# Patient Record
Sex: Male | Born: 1970 | Race: Black or African American | Hispanic: No | Marital: Married | Smoking: Light tobacco smoker
Health system: Southern US, Community
[De-identification: ages and names within clinical notes are randomized; demographics above are authoritative.]

## PROBLEM LIST (undated history)

## (undated) DIAGNOSIS — I10 Essential (primary) hypertension: Secondary | ICD-10-CM

## (undated) DIAGNOSIS — F1090 Alcohol use, unspecified, uncomplicated: Secondary | ICD-10-CM

## (undated) DIAGNOSIS — I119 Hypertensive heart disease without heart failure: Secondary | ICD-10-CM

## (undated) DIAGNOSIS — Z9189 Other specified personal risk factors, not elsewhere classified: Secondary | ICD-10-CM

## (undated) DIAGNOSIS — I341 Nonrheumatic mitral (valve) prolapse: Secondary | ICD-10-CM

## (undated) DIAGNOSIS — E785 Hyperlipidemia, unspecified: Secondary | ICD-10-CM

## (undated) DIAGNOSIS — I2510 Atherosclerotic heart disease of native coronary artery without angina pectoris: Secondary | ICD-10-CM

## (undated) DIAGNOSIS — R7989 Other specified abnormal findings of blood chemistry: Secondary | ICD-10-CM

## (undated) DIAGNOSIS — Z7289 Other problems related to lifestyle: Secondary | ICD-10-CM

## (undated) DIAGNOSIS — Z9289 Personal history of other medical treatment: Secondary | ICD-10-CM

## (undated) DIAGNOSIS — Z720 Tobacco use: Secondary | ICD-10-CM

## (undated) HISTORY — DX: Other specified abnormal findings of blood chemistry: R79.89

## (undated) HISTORY — DX: Alcohol use, unspecified, uncomplicated: F10.90

## (undated) HISTORY — PX: CORONARY ANGIOPLASTY: SHX604

## (undated) HISTORY — DX: Other problems related to lifestyle: Z72.89

## (undated) HISTORY — DX: Other specified personal risk factors, not elsewhere classified: Z91.89

## (undated) HISTORY — DX: Personal history of other medical treatment: Z92.89

## (undated) HISTORY — DX: Atherosclerotic heart disease of native coronary artery without angina pectoris: I25.10

## (undated) HISTORY — DX: Hypertensive heart disease without heart failure: I11.9

## (undated) HISTORY — DX: Tobacco use: Z72.0

## (undated) HISTORY — DX: Nonrheumatic mitral (valve) prolapse: I34.1

---

## 1997-12-07 ENCOUNTER — Emergency Department (HOSPITAL_COMMUNITY): Admission: EM | Admit: 1997-12-07 | Discharge: 1997-12-07 | Payer: Self-pay | Admitting: Emergency Medicine

## 2001-08-02 ENCOUNTER — Emergency Department (HOSPITAL_COMMUNITY): Admission: EM | Admit: 2001-08-02 | Discharge: 2001-08-02 | Payer: Self-pay | Admitting: Emergency Medicine

## 2001-08-02 ENCOUNTER — Encounter: Payer: Self-pay | Admitting: Emergency Medicine

## 2003-03-10 ENCOUNTER — Emergency Department (HOSPITAL_COMMUNITY): Admission: EM | Admit: 2003-03-10 | Discharge: 2003-03-10 | Payer: Self-pay | Admitting: Emergency Medicine

## 2004-03-03 ENCOUNTER — Emergency Department (HOSPITAL_COMMUNITY): Admission: EM | Admit: 2004-03-03 | Discharge: 2004-03-03 | Payer: Self-pay | Admitting: Emergency Medicine

## 2010-02-14 ENCOUNTER — Emergency Department (HOSPITAL_COMMUNITY)
Admission: EM | Admit: 2010-02-14 | Discharge: 2010-02-14 | Payer: Self-pay | Source: Home / Self Care | Admitting: Emergency Medicine

## 2010-02-14 IMAGING — CR DG CHEST 2V
2 series · 2 of 2 positions shown · non-contrast
Comparison: None.

CLINICAL DATA: Short of breath.  Cold symptoms.

CHEST - 2 VIEW

[w chest pa]
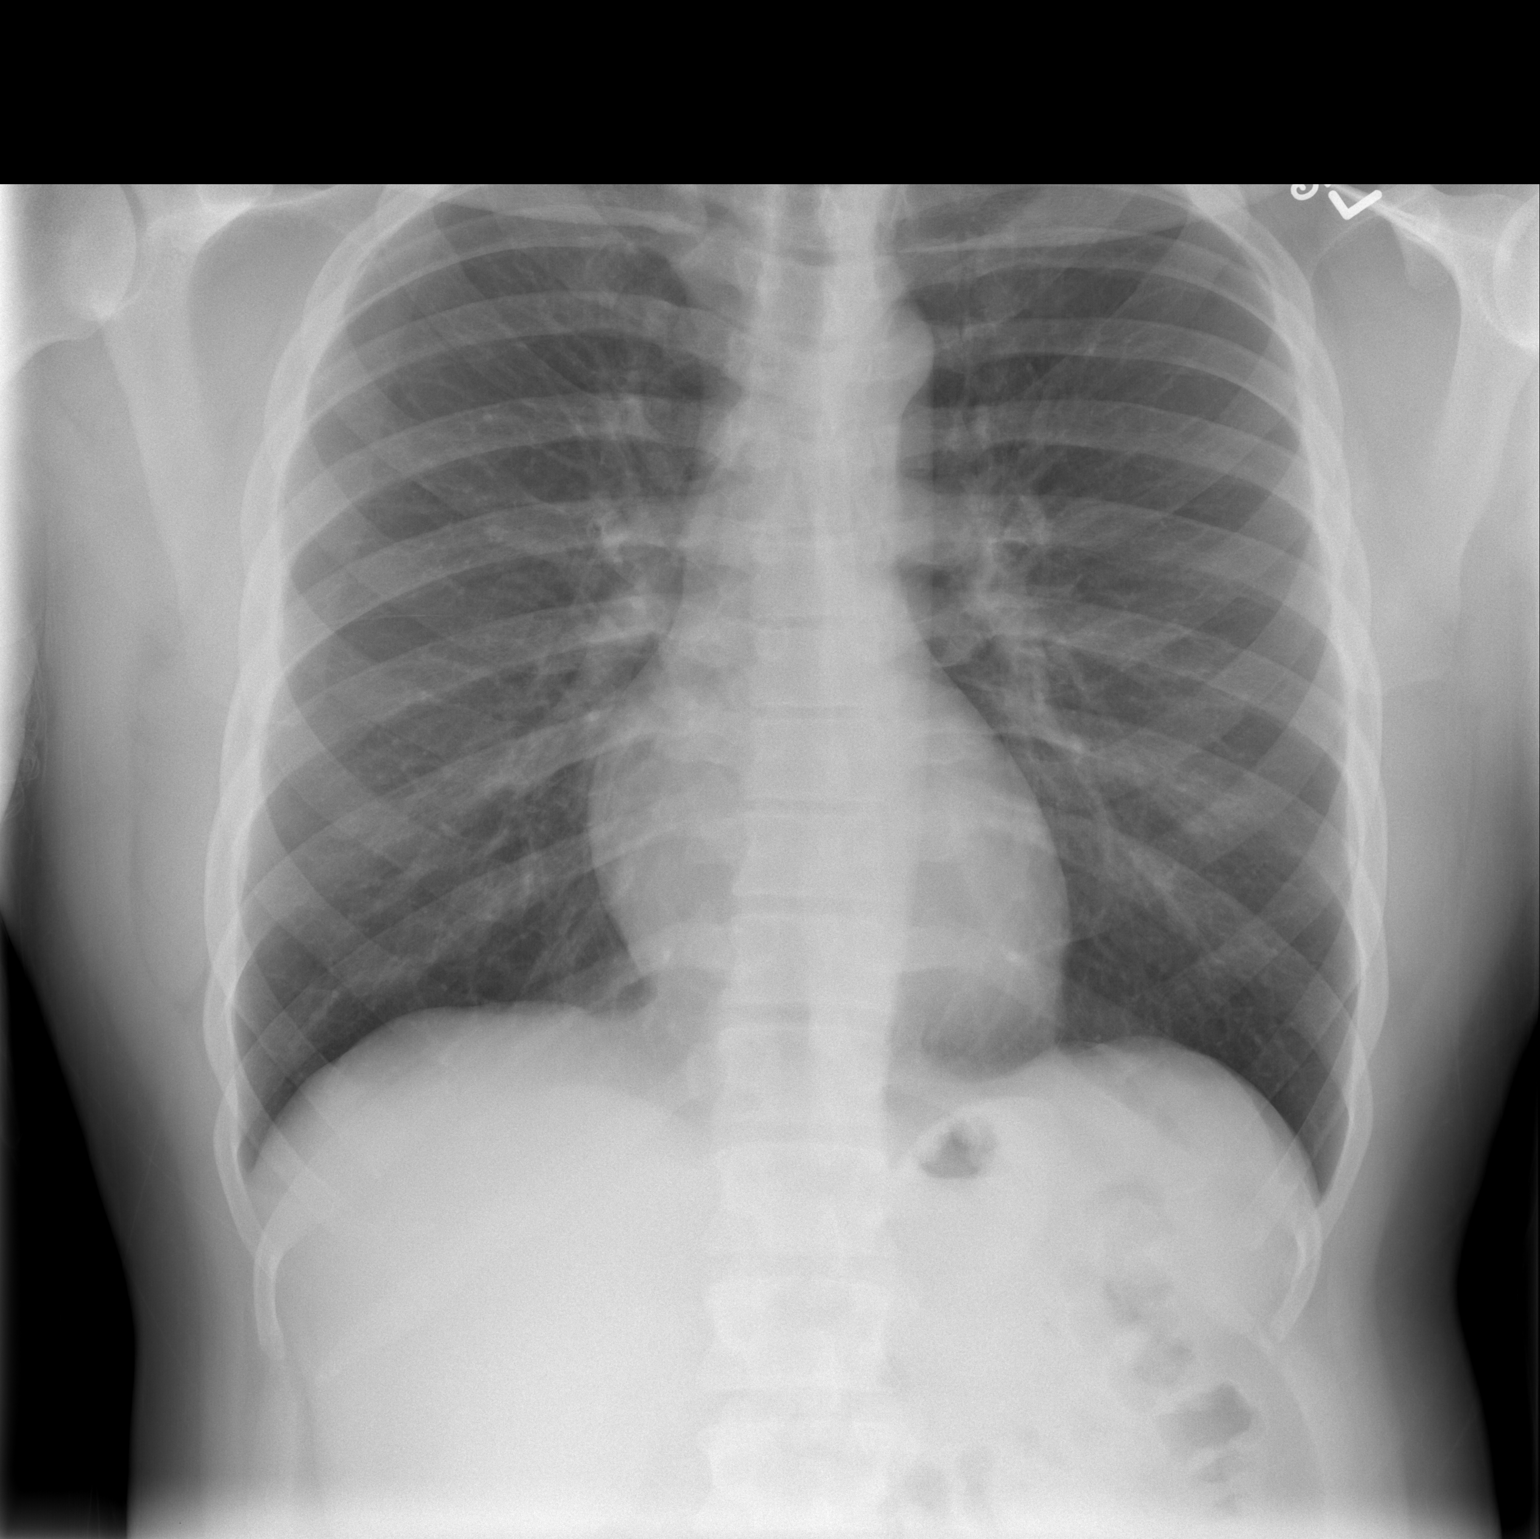

[w chest lat]
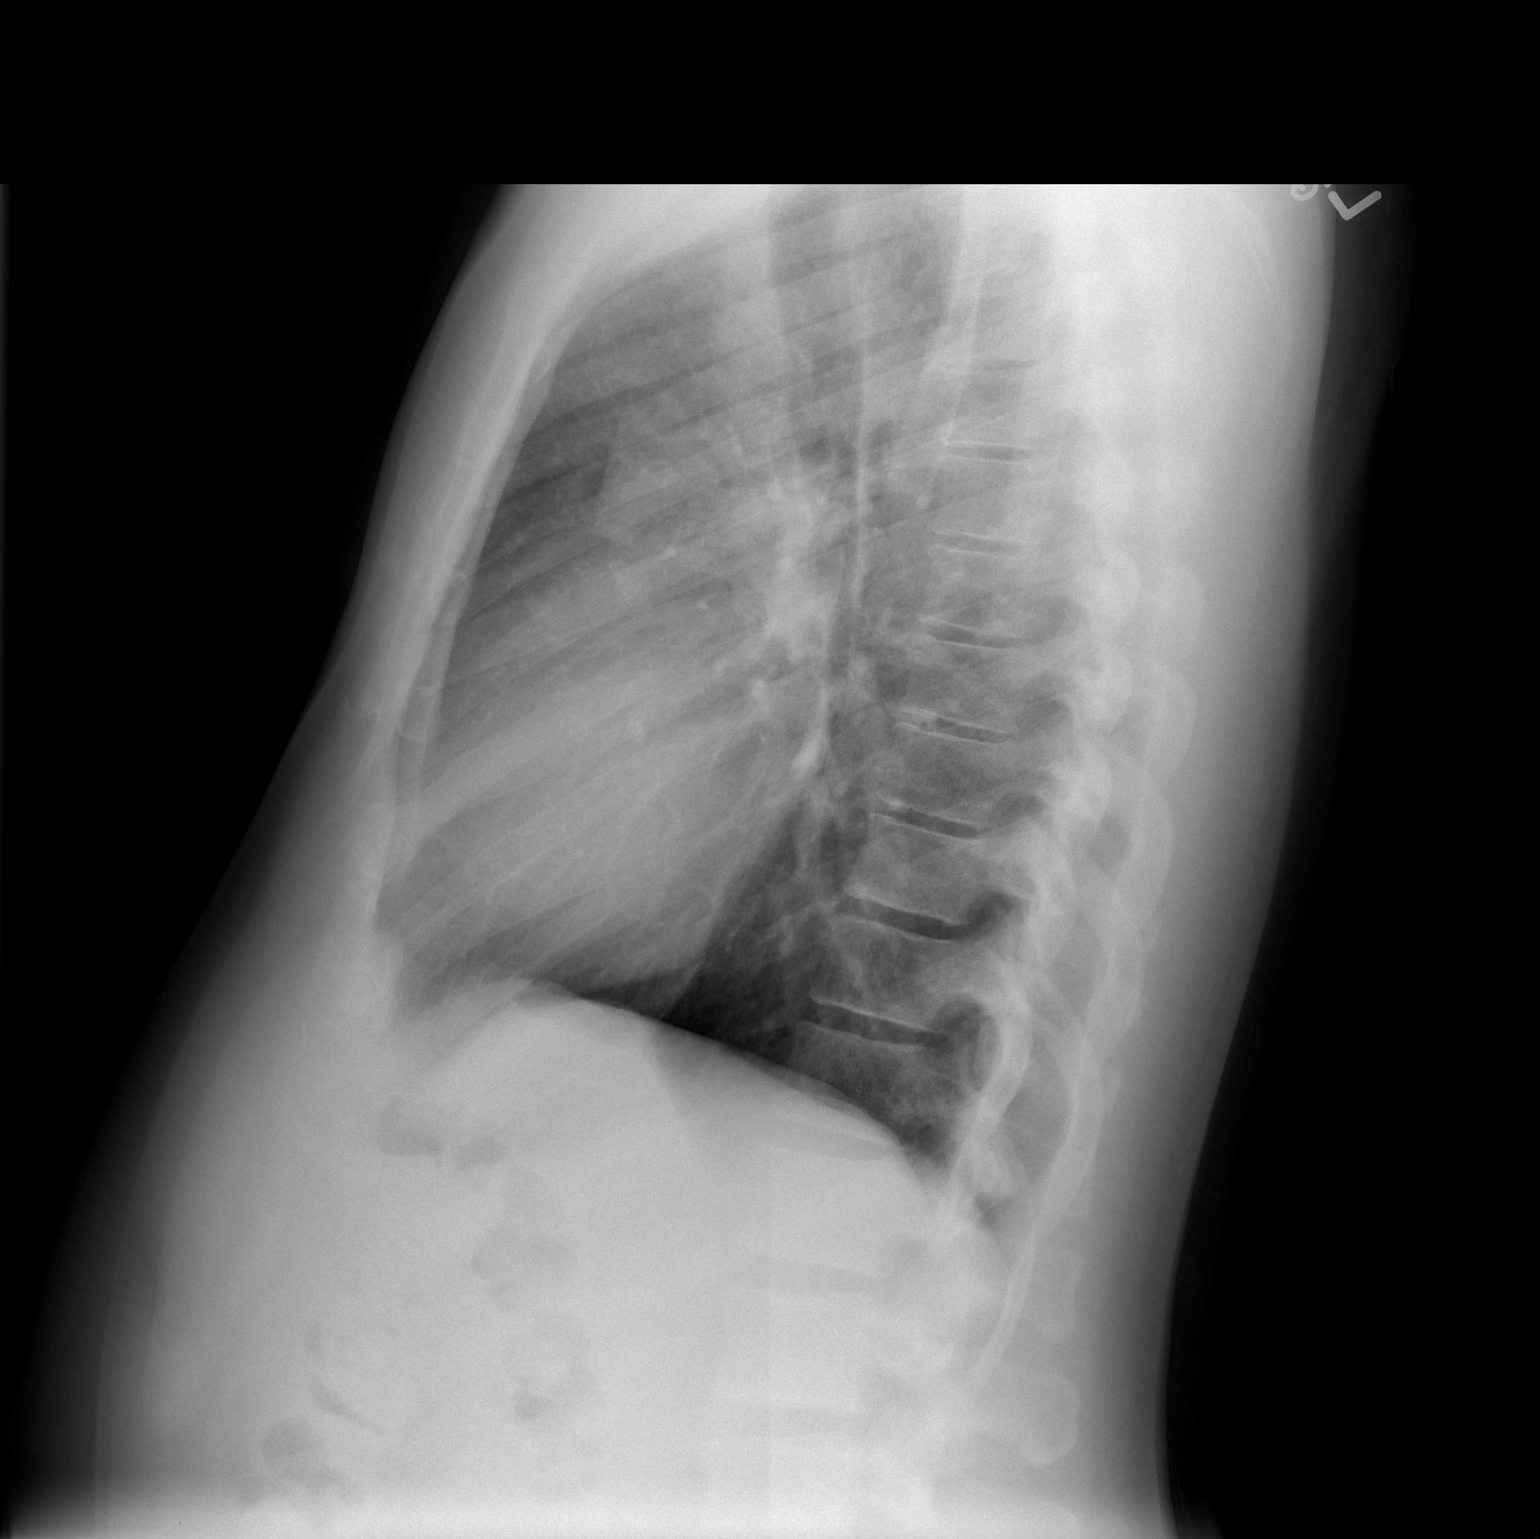

[2 of 2 positions shown; findings below may reference images not displayed]

FINDINGS: Midline trachea.   Normal heart size and mediastinal
contours. No pleural effusion or pneumothorax.  Clear lungs.
IMPRESSION: No acute cardiopulmonary disease.

## 2011-02-11 ENCOUNTER — Encounter (HOSPITAL_COMMUNITY): Payer: Self-pay | Admitting: *Deleted

## 2011-02-11 ENCOUNTER — Emergency Department (HOSPITAL_COMMUNITY)
Admission: EM | Admit: 2011-02-11 | Discharge: 2011-02-11 | Disposition: A | Discharge status: Home / Self Care | Payer: BC Managed Care – HMO | Attending: Emergency Medicine | Admitting: Emergency Medicine

## 2011-02-11 ENCOUNTER — Emergency Department (HOSPITAL_COMMUNITY): Payer: BC Managed Care – HMO

## 2011-02-11 ENCOUNTER — Other Ambulatory Visit: Payer: Self-pay

## 2011-02-11 DIAGNOSIS — I10 Essential (primary) hypertension: Secondary | ICD-10-CM | POA: Insufficient documentation

## 2011-02-11 DIAGNOSIS — R0789 Other chest pain: Secondary | ICD-10-CM | POA: Insufficient documentation

## 2011-02-11 HISTORY — DX: Essential (primary) hypertension: I10

## 2011-02-11 LAB — POCT I-STAT, CHEM 8
Calcium, Ion: 1.13 mmol/L (ref 1.12–1.32)
Chloride: 108 mEq/L (ref 96–112)
HCT: 46 % (ref 39.0–52.0)
Hemoglobin: 15.6 g/dL (ref 13.0–17.0)
TCO2: 23 mmol/L (ref 0–100)

## 2011-02-11 LAB — CBC
MCV: 80.8 fL (ref 78.0–100.0)
Platelets: 244 10*3/uL (ref 150–400)
RDW: 14.3 % (ref 11.5–15.5)
WBC: 5.5 10*3/uL (ref 4.0–10.5)

## 2011-02-11 LAB — DIFFERENTIAL
Basophils Absolute: 0 10*3/uL (ref 0.0–0.1)
Basophils Relative: 0 % (ref 0–1)
Eosinophils Relative: 7 % — ABNORMAL HIGH (ref 0–5)
Lymphocytes Relative: 37 % (ref 12–46)

## 2011-02-11 LAB — D-DIMER, QUANTITATIVE: D-Dimer, Quant: <0.22 ug/mL-FEU (ref 0.00–0.48)

## 2011-02-11 LAB — POCT I-STAT TROPONIN I: Troponin i, poc: 0.01 ng/mL (ref 0.00–0.08)

## 2011-02-11 IMAGING — CR DG CHEST 2V
2 series · 2 of 2 positions shown · non-contrast
Comparison: [DATE].

CLINICAL DATA: Left chest pain.  Smoker.  History of hypertension.

CHEST - 2 VIEW

[w chest pa]
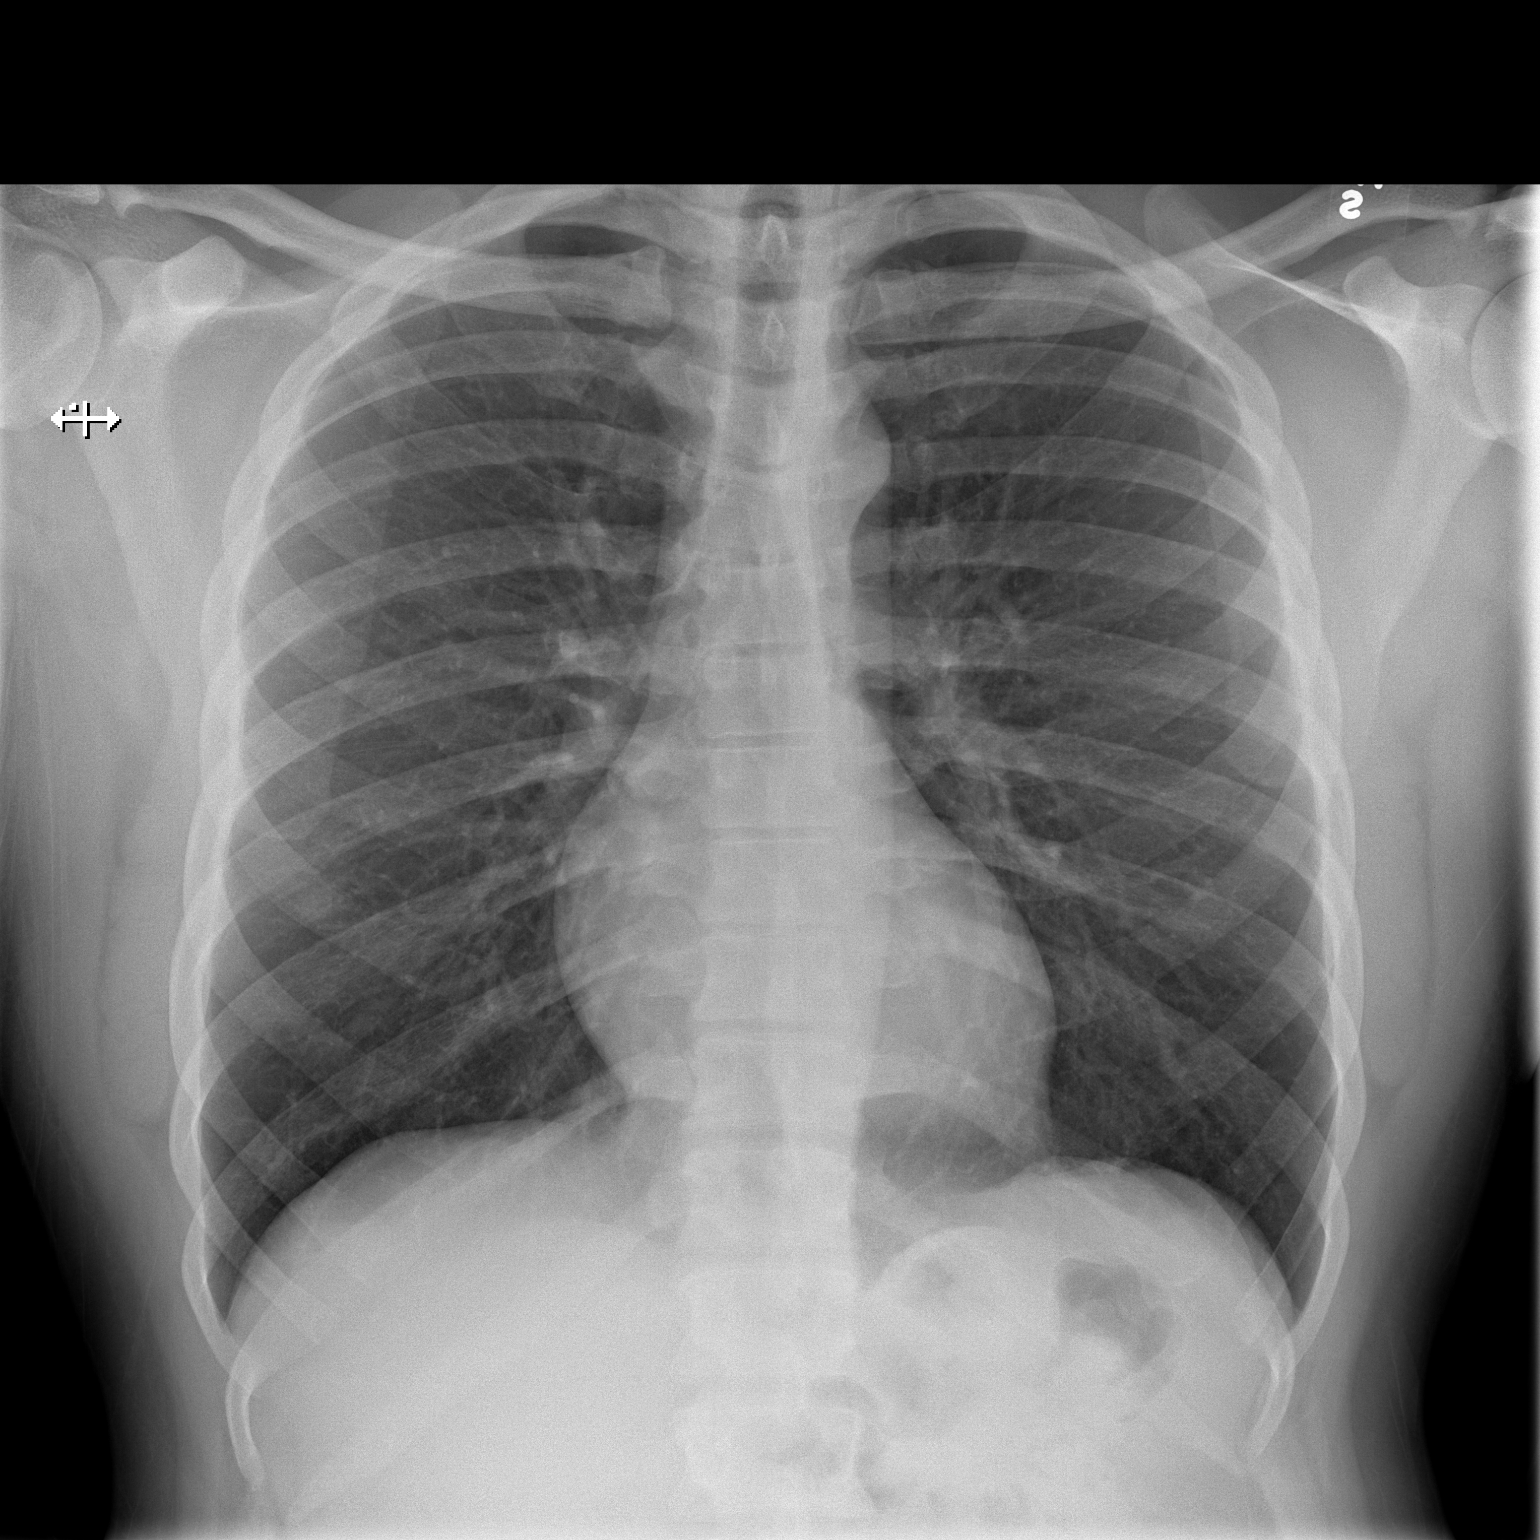

[w chest lat]
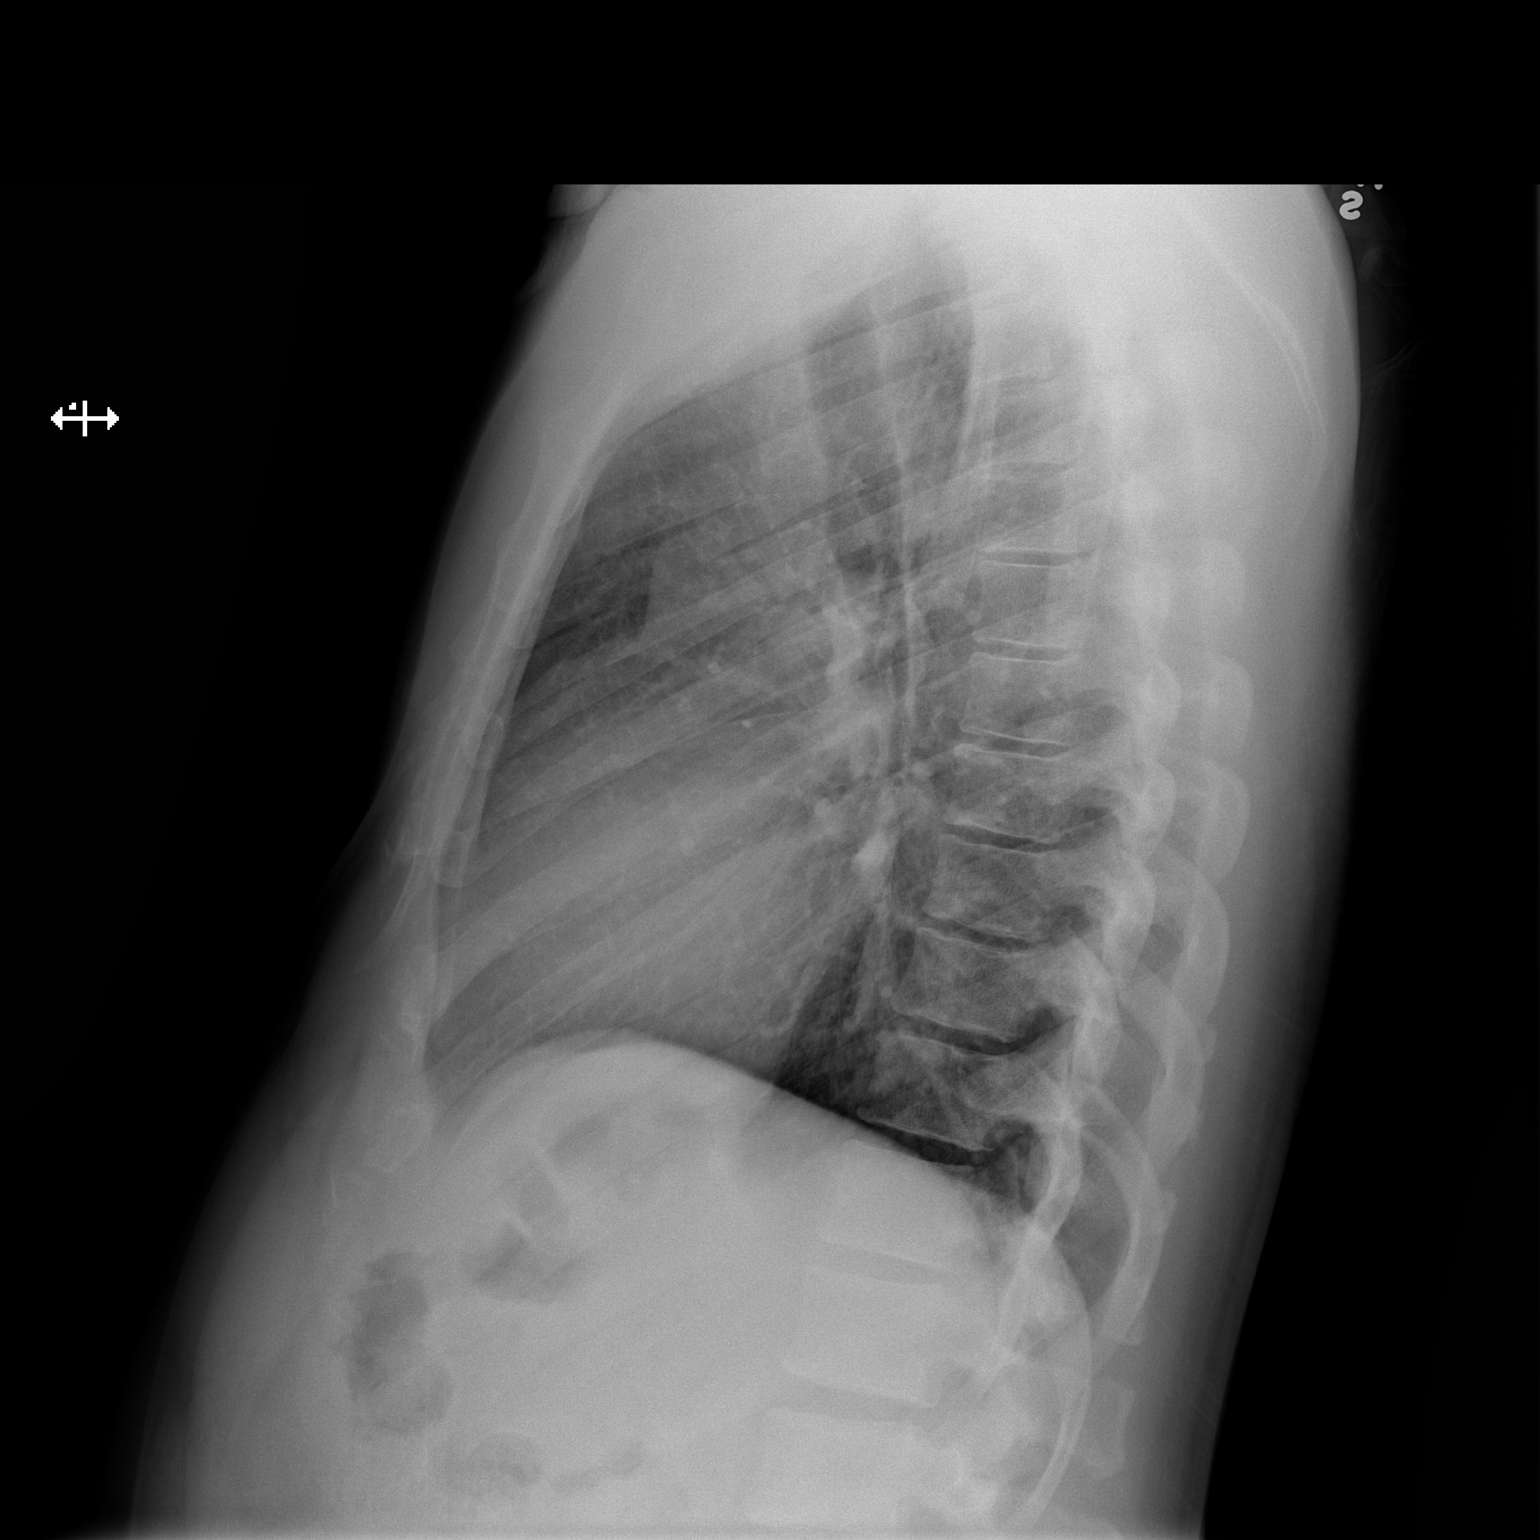

[2 of 2 positions shown; findings below may reference images not displayed]

FINDINGS: Stable normal sized heart and clear lungs.  Stable mild
diffuse peribronchial thickening and minimal scoliosis.
IMPRESSION: Stable mild chronic bronchitic changes.  No acute abnormality.

## 2011-02-11 MED ORDER — LISINOPRIL 20 MG PO TABS: 20.0000 mg | ORAL_TABLET | Freq: Every day | ORAL | Status: DC

## 2011-02-11 NOTE — ED Provider Notes (Signed)
History     CSN: [id number]  Arrival date & time 02/11/11  1146   First MD Initiated Contact with Patient 02/11/11 1249      Chief Complaint  Patient presents with  . Chest Pain    (Consider location/radiation/quality/duration/timing/severity/associated sxs/prior treatment) HPI This 41 year old male has history of hypertension in the past but does not have a doctor and does not recall the pressure medication he is to be on. He is here because for the last several days 24 hours a day he has a left-sided localized anterolateral chest discomfort when he takes a deep breath or twists his torso in a certain way. He has slight if any cough and no shortness of breath, no exertional pain, no lightheadedness no abdominal pain no back pain no flank pain no nausea or vomiting no diaphoresis no weakness or numbness. He has had no treatment and no associated symptoms besides this anterior chest discomfort when he takes a deep breath. His discomfort is mild to moderate Past Medical History  Diagnosis Date  . Hypertension     No past surgical history on file.  No family history on file.  History  Substance Use Topics  . Smoking status: Not on file  . Smokeless tobacco: Not on file  . Alcohol Use:       Review of Systems  Constitutional: Negative for fever.       10 Systems reviewed and are negative for acute change except as noted in the HPI.  HENT: Negative for congestion.   Eyes: Negative for discharge and redness.  Respiratory: Negative for cough and shortness of breath.   Cardiovascular: Positive for chest pain. Negative for palpitations and leg swelling.  Gastrointestinal: Negative for vomiting and abdominal pain.  Musculoskeletal: Negative for back pain.  Skin: Negative for rash.  Neurological: Negative for syncope, numbness and headaches.  Psychiatric/Behavioral:       No behavior change.    Allergies  Review of patient's allergies indicates no known allergies.  Home  Medications   Current Outpatient Rx  Name Route Sig Dispense Refill  . ASPIRIN 325 MG PO TABS Oral Take 325 mg by mouth 2 (two) times daily as needed. pain     . LISINOPRIL 20 MG PO TABS Oral Take 1 tablet (20 mg total) by mouth daily. 30 tablet 0    BP 158/88  Pulse 74  Temp(Src) 98.3 F (36.8 C) (Oral)  Resp 18  Ht 5' (1.524 m)  Wt 177 lb (80.287 kg)  BMI 34.57 kg/m2  SpO2 98%  Physical Exam  Nursing note and vitals reviewed. Constitutional:       Awake, alert, nontoxic appearance.  HENT:  Head: Atraumatic.  Eyes: Right eye exhibits no discharge. Left eye exhibits no discharge.  Neck: Neck supple.  Cardiovascular: Normal rate and regular rhythm.   No murmur heard. Pulmonary/Chest: Effort normal and breath sounds normal. No respiratory distress. He has no wheezes. He has no rales. He exhibits tenderness.       Mild left anterior lower chest wall tenderness without rash or fluctuance  Abdominal: Soft. There is no tenderness. There is no rebound.  Musculoskeletal: He exhibits no edema and no tenderness.       Baseline ROM, no obvious new focal weakness.  Neurological: He is alert.       Mental status and motor strength appears baseline for patient and situation.  Skin: No rash noted.  Psychiatric: He has a normal mood and affect.    ED  Course  Procedures (including critical care time) ECG: Normal sinus rhythm, ventricular rate 96, normal axis, normal intervals, nonspecific T wave changes, no comparison ECG available.  Moved to clinical decision unit as shared visit with physician's assistant. Labs Reviewed  DIFFERENTIAL - Abnormal; Notable for the following:    Eosinophils Relative 7 (*)    All other components within normal limits  CBC  D-DIMER, QUANTITATIVE  POCT I-STAT, CHEM 8  POCT I-STAT TROPONIN I   Dg Chest 2 View  02/11/2011  *RADIOLOGY REPORT*  Clinical Data: Left chest pain.  Smoker.  History of hypertension.  CHEST - 2 VIEW  Comparison: 02/14/2010.   Findings: Stable normal sized heart and clear lungs.  Stable mild diffuse peribronchial thickening and minimal scoliosis.  IMPRESSION: Stable mild chronic bronchitic changes.  No acute abnormality.  Original Report Authenticated By: Darrol Angel, M.D.     1. Chest pain       MDM  Medical screening examination/treatment/procedure(s) were conducted as a shared visit with non-physician practitioner(s) and myself.  I personally evaluated the patient during the encounter        Hurman Horn, MD 02/11/11 2330

## 2011-02-11 NOTE — ED Notes (Signed)
Attempted iv start x 2 without success. IV team paged to start iv.

## 2011-02-11 NOTE — ED Provider Notes (Signed)
Medical screening examination/treatment/procedure(s) were conducted as a shared visit with non-physician practitioner(s) and myself.  I personally evaluated the patient during the encounter  Hurman Horn, MD 02/11/11 2252

## 2011-02-11 NOTE — ED Notes (Signed)
Waiting in triage waiting

## 2011-02-11 NOTE — ED Notes (Signed)
Pt. Stated, my pain started onTuesday at 2330 at night, and its still there

## 2011-02-11 NOTE — ED Provider Notes (Signed)
2:05 PM Chest pain x5 days to the CDU while awaiting testing. He is in no distress on my examination. He is alert and oriented x3. Lungs are CTAB with normal respiratory effort. Heart RRR. Tenderness to palpation of the left mid-lower anterior chest wall. Abdomen soft, nontender nondistended. Positive bowel sounds. Moves all extremities well. Speech is clear.    3:47 PM Labs reviewed with no acute findings. CXR reviewed, stable bronchitic changes. Discussed HTN with pt and will restart his lisinopril. He will call numbers on the resource guide to set up primary care. Given precautions to return if any new or concerning symptoms.  Elwyn Reach [place], [place] 02/11/11 1549

## 2012-02-07 HISTORY — PX: FINGER FRACTURE SURGERY: SHX638

## 2012-02-09 ENCOUNTER — Encounter (HOSPITAL_COMMUNITY): Payer: Self-pay | Admitting: *Deleted

## 2012-02-09 ENCOUNTER — Emergency Department (HOSPITAL_COMMUNITY)
Admission: EM | Admit: 2012-02-09 | Discharge: 2012-02-09 | Disposition: A | Discharge status: Home / Self Care | Payer: BC Managed Care – HMO | Attending: Emergency Medicine | Admitting: Emergency Medicine

## 2012-02-09 DIAGNOSIS — Z79899 Other long term (current) drug therapy: Secondary | ICD-10-CM | POA: Insufficient documentation

## 2012-02-09 DIAGNOSIS — R300 Dysuria: Secondary | ICD-10-CM | POA: Insufficient documentation

## 2012-02-09 DIAGNOSIS — M25569 Pain in unspecified knee: Secondary | ICD-10-CM | POA: Insufficient documentation

## 2012-02-09 DIAGNOSIS — F17200 Nicotine dependence, unspecified, uncomplicated: Secondary | ICD-10-CM | POA: Insufficient documentation

## 2012-02-09 DIAGNOSIS — I10 Essential (primary) hypertension: Secondary | ICD-10-CM | POA: Insufficient documentation

## 2012-02-09 DIAGNOSIS — R059 Cough, unspecified: Secondary | ICD-10-CM | POA: Insufficient documentation

## 2012-02-09 DIAGNOSIS — R05 Cough: Secondary | ICD-10-CM | POA: Insufficient documentation

## 2012-02-09 LAB — URINALYSIS, ROUTINE W REFLEX MICROSCOPIC
Bilirubin Urine: NEGATIVE
Hgb urine dipstick: NEGATIVE
Ketones, ur: NEGATIVE mg/dL
Protein, ur: NEGATIVE mg/dL
Urobilinogen, UA: 1 mg/dL (ref 0.0–1.0)

## 2012-02-09 MED ORDER — HYDROCODONE-ACETAMINOPHEN 5-325 MG PO TABS: 1.0000 | ORAL_TABLET | ORAL | Status: DC | PRN

## 2012-02-09 NOTE — ED Notes (Signed)
Pt c/o cough and "burning in stomach" along with tingling feeling in penis when he urinates. Pt denies mucus production with cough. Pt reports last time he was was late night. Pt denies n/v/d.

## 2012-02-09 NOTE — ED Provider Notes (Signed)
History     CSN: [id number]  Arrival date & time 02/09/12  1735   First MD Initiated Contact with Patient 02/09/12 2002      Chief Complaint  Patient presents with  . Leg Pain  . Cough  . Dysuria   HPI  History provided by the patient. Patient is a 42 year old male with history of hypertension presents with complaints of worsening chronic pain to the right knee and thigh area. Patient reports having an old injury to his right leg and knee which has caused some intermittent pains and discomforts for the past one to 2 years. He reports over the last 2 weeks has had increasing pains that are especially troublesome at night when trying to rest and sleep. He reports difficulty getting comfortable and laying on his side secondary to pain. Patient has used some over-the-counter pain medications without relief. He pain is primarily in the median radiates some to the right thigh and hip area. He denies any low back pain or discomfort. Denies any weakness or numbness in lower legs. Patient also has secondary complaint    Past Medical History  Diagnosis Date  . Hypertension     History reviewed. No pertinent past surgical history.  History reviewed. No pertinent family history.  History  Substance Use Topics  . Smoking status: Current Every Day Smoker    Types: Cigarettes  . Smokeless tobacco: Not on file  . Alcohol Use: No      Review of Systems  Constitutional: Negative for fever and chills.  Genitourinary: Positive for dysuria. Negative for frequency, hematuria and flank pain.  All other systems reviewed and are negative.    Allergies  Review of patient's allergies indicates no known allergies.  Home Medications   Current Outpatient Rx  Name  Route  Sig  Dispense  Refill  . LISINOPRIL 20 MG PO TABS   Oral   Take 1 tablet (20 mg total) by mouth daily.   30 tablet   0     BP 155/85  Pulse 87  Temp 98.5 F (36.9 C) (Oral)  Resp 18  SpO2 97%  Physical Exam    Nursing note and vitals reviewed. Constitutional: He is oriented to person, place, and time. He appears well-developed and well-nourished. No distress.  HENT:  Head: Normocephalic.  Cardiovascular: Normal rate and regular rhythm.   Pulmonary/Chest: Effort normal and breath sounds normal.  Genitourinary: Testes normal and penis normal. Circumcised. No discharge found.  Musculoskeletal: Normal range of motion. He exhibits no edema and no tenderness.       Thoracic back: Normal.       Lumbar back: He exhibits tenderness. He exhibits normal range of motion and no bony tenderness.       Back:  Neurological: He is alert and oriented to person, place, and time. He has normal strength. No sensory deficit.  Skin: Skin is warm. No rash noted. No erythema.  Psychiatric: He has a normal mood and affect. His behavior is normal.    ED Course  Procedures    Labs Reviewed  URINALYSIS, ROUTINE W REFLEX MICROSCOPIC     1. Knee pain       MDM  9:00 PM patient seen and evaluated. Patient resting comfortably appears well in no acute distress.  Patient with no significant history of trauma or injury to the knee. He has had chronic symptoms for the past one to 2 years with worsening over the past 2 weeks. No significant swelling. At this  time no indications for imaging. Will treat symptomatically and provide orthopedic referral.        Angus Seller, PA 02/10/12 [phone]

## 2012-02-09 NOTE — ED Notes (Signed)
Pt has multiple complaints. Reports feeling "pins and needles" when he urinates, having body aches, cough, congestion and right knee and hip pain. No acute distress noted at triage.

## 2012-02-10 NOTE — ED Provider Notes (Signed)
Medical screening examination/treatment/procedure(s) were performed by non-physician practitioner and as supervising physician I was immediately available for consultation/collaboration.   Dione Booze, MD 02/10/12 [phone]

## 2012-05-25 ENCOUNTER — Encounter (HOSPITAL_COMMUNITY): Payer: Self-pay | Admitting: *Deleted

## 2012-05-25 ENCOUNTER — Emergency Department (HOSPITAL_COMMUNITY)
Admission: EM | Admit: 2012-05-25 | Discharge: 2012-05-25 | Disposition: A | Discharge status: Home / Self Care | Payer: Self-pay | Attending: Emergency Medicine | Admitting: Emergency Medicine

## 2012-05-25 DIAGNOSIS — Z79899 Other long term (current) drug therapy: Secondary | ICD-10-CM | POA: Insufficient documentation

## 2012-05-25 DIAGNOSIS — F17200 Nicotine dependence, unspecified, uncomplicated: Secondary | ICD-10-CM | POA: Insufficient documentation

## 2012-05-25 DIAGNOSIS — K029 Dental caries, unspecified: Secondary | ICD-10-CM | POA: Insufficient documentation

## 2012-05-25 DIAGNOSIS — I10 Essential (primary) hypertension: Secondary | ICD-10-CM | POA: Insufficient documentation

## 2012-05-25 DIAGNOSIS — K047 Periapical abscess without sinus: Secondary | ICD-10-CM | POA: Insufficient documentation

## 2012-05-25 DIAGNOSIS — R221 Localized swelling, mass and lump, neck: Secondary | ICD-10-CM | POA: Insufficient documentation

## 2012-05-25 DIAGNOSIS — R220 Localized swelling, mass and lump, head: Secondary | ICD-10-CM | POA: Insufficient documentation

## 2012-05-25 MED ORDER — HYDROCODONE-ACETAMINOPHEN 5-325 MG PO TABS: 1.0000 | ORAL_TABLET | Freq: Four times a day (QID) | ORAL | Status: DC | PRN

## 2012-05-25 MED ORDER — IBUPROFEN 400 MG PO TABS
600.0000 mg | ORAL_TABLET | Freq: Once | ORAL | Status: AC
  Administered 2012-05-25: 600 mg via ORAL
  Filled 2012-05-25: qty 2

## 2012-05-25 MED ORDER — AMOXICILLIN 500 MG PO CAPS: 500.0000 mg | ORAL_CAPSULE | Freq: Three times a day (TID) | ORAL | Status: DC

## 2012-05-25 MED ORDER — AMOXICILLIN 500 MG PO CAPS
1000.0000 mg | ORAL_CAPSULE | Freq: Once | ORAL | Status: AC
  Administered 2012-05-25: 1000 mg via ORAL
  Filled 2012-05-25: qty 2

## 2012-05-25 NOTE — ED Provider Notes (Signed)
History     CSN: [id number]  Arrival date & time 05/25/12  1501   First MD Initiated Contact with Patient 05/25/12 1516      Chief Complaint  Patient presents with  . Oral Swelling  . Dental Pain    (Consider location/radiation/quality/duration/timing/severity/associated sxs/prior treatment) Patient is a 42 y.o. male presenting with tooth pain. The history is provided by the patient.  Dental PainPrimary symptoms do not include headaches or fever.  pt c/o frontal dental pain and swelling for the past couple days. States tooth 8 was bothering him, painful for quite some time, wasbroken off previously/decayed. 2 days ago, a piece of stick got kicked up from mower hitting tooth. Notes increased pain and swelling since. Constant, dull, mod-sev, worse w palp. No fever or chills. No lacs/abrasion to lip, mouth or face. No trouble breathing or swallowing. No throat pain or swelling. No neck pain.     Past Medical History  Diagnosis Date  . Hypertension     History reviewed. No pertinent past surgical history.  History reviewed. No pertinent family history.  History  Substance Use Topics  . Smoking status: Current Every Day Smoker    Types: Cigarettes  . Smokeless tobacco: Not on file  . Alcohol Use: No      Review of Systems  Constitutional: Negative for fever and chills.  HENT: Negative for neck pain.   Gastrointestinal: Negative for nausea and vomiting.  Skin: Negative for wound.  Neurological: Negative for headaches.    Allergies  Review of patient's allergies indicates no known allergies.  Home Medications   Current Outpatient Rx  Name  Route  Sig  Dispense  Refill  . lisinopril (PRINIVIL,ZESTRIL) 20 MG tablet   Oral   Take 20 mg by mouth daily.         . naproxen sodium (ANAPROX) 220 MG tablet   Oral   Take 440 mg by mouth 2 (two) times daily as needed (for pain).           BP 168/99  Pulse 110  Temp(Src) 99.1 F (37.3 C) (Oral)  Resp 20  SpO2  98%  Physical Exam  Nursing note and vitals reviewed. Constitutional: He is oriented to person, place, and time. He appears well-developed and well-nourished. No distress.  HENT:  Head: Atraumatic.  Mouth/Throat: Oropharynx is clear and moist.  Several decayed teeth, tooth 8 broken off just above gumline/decayed. Associated gum swelling and tenderness, moderate. Pharynx normal. No floor of mouth pain, swelling or tenderness.   Eyes: Conjunctivae are normal.  Neck: Neck supple. No tracheal deviation present.  Cardiovascular: Normal rate.   Pulmonary/Chest: Effort normal. No accessory muscle usage. No respiratory distress.  No stridor. Breathing normally.   Musculoskeletal: Normal range of motion.  Lymphadenopathy:    He has no cervical adenopathy.  Neurological: He is alert and oriented to person, place, and time.  Skin: Skin is warm and dry. No rash noted.  Psychiatric: He has a normal mood and affect.    ED Course  Procedures (including critical care time)     MDM  Confirmed nkda w pt. Pt drove self to ed. Will I and D abscess. amox 1 gm po. Motrin po.  Reviewed nursing notes and prior charts for additional history.    INCISION AND DRAINAGE Performed by: Suzi Roots Consent: Verbal consent obtained. Risks and benefits: risks, benefits and alternatives were discussed Type: abscess  Body area: dental/maxillary abscess  Anesthesia: local infiltration  Incision was made with  a scalpel.  Local anesthetic: lidocaine 2% w epinephrine  Anesthetic total: 2 ml  Complexity: complex Blunt dissection to break up loculations  Drainage: purulent  Drainage amount: moderate   Patient tolerance: Patient tolerated the procedure well with no immediate complications.     Abscess drained, swelling improved. Pt comfortable. Pt rinsed area several times.  Hr 88 rr 16.  Pt appears stable for d/c.        Suzi Roots, MD 05/25/12 1600

## 2012-05-25 NOTE — ED Notes (Signed)
Reports having dental pain to front upper teeth. Was at work yesterday and was hit in upper lip/tooth with a stick. Now having swelling to upper lip and face. Airway intact.

## 2012-08-21 ENCOUNTER — Encounter (HOSPITAL_COMMUNITY): Payer: Self-pay | Admitting: Emergency Medicine

## 2012-08-21 ENCOUNTER — Emergency Department (HOSPITAL_COMMUNITY)
Admission: EM | Admit: 2012-08-21 | Discharge: 2012-08-21 | Disposition: A | Discharge status: Home / Self Care | Payer: BC Managed Care – PPO | Source: Home / Self Care | Attending: Family Medicine | Admitting: Family Medicine

## 2012-08-21 DIAGNOSIS — I10 Essential (primary) hypertension: Secondary | ICD-10-CM

## 2012-08-21 LAB — POCT I-STAT, CHEM 8
Calcium, Ion: 1.15 mmol/L (ref 1.12–1.23)
Creatinine, Ser: 1 mg/dL (ref 0.50–1.35)
Glucose, Bld: 72 mg/dL (ref 70–99)
HCT: 45 % (ref 39.0–52.0)
Hemoglobin: 15.3 g/dL (ref 13.0–17.0)

## 2012-08-21 LAB — POCT URINALYSIS DIP (DEVICE)
Hgb urine dipstick: NEGATIVE
Nitrite: NEGATIVE
Protein, ur: NEGATIVE mg/dL
Urobilinogen, UA: 0.2 mg/dL (ref 0.0–1.0)
pH: 6 (ref 5.0–8.0)

## 2012-08-21 MED ORDER — LISINOPRIL 20 MG PO TABS: 20.0000 mg | ORAL_TABLET | Freq: Every day | ORAL | Status: DC

## 2012-08-21 NOTE — ED Notes (Signed)
Waiting discharge papers 

## 2012-08-21 NOTE — ED Provider Notes (Signed)
History    CSN: [id number] Arrival date & time 08/21/12  1256  First MD Initiated Contact with Patient 08/21/12 1353     Chief Complaint  Patient presents with  . Hypertension   (Consider location/radiation/quality/duration/timing/severity/associated sxs/prior Treatment) HPI Comments: 42 year old male smoker with history of hypertension. Here complaining of an episode of increased blood pressure this morning when he was a dentist office. States his systolic blood pressure  Was up 170-190 and he was recommended to have it rechecked later today. States that this episode was also associated with feeling fatigued and hand a tingling sensations in his fingers of both hands. State he is asymptomatic now. Denies chest pain, proximal nocturnal dyspnea or leg swelling. He ran out of his blood pressure medication since March (4 month ago) and has not been seen by a doctor in over a year. He does not have a primary care provider currently but has a new patient appointment In a primary care office on August 14. Denies headache, blurred vision, extremity weakness or numbness.  Past Medical History  Diagnosis Date  . Hypertension    History reviewed. No pertinent past surgical history. No family history on file. History  Substance Use Topics  . Smoking status: Current Every Day Smoker    Types: Cigarettes  . Smokeless tobacco: Not on file  . Alcohol Use: No    Review of Systems  Constitutional: Negative for fever, chills, diaphoresis and activity change.  Eyes: Negative for visual disturbance.  Respiratory: Negative for cough and shortness of breath.   Cardiovascular: Negative for chest pain and leg swelling.  Gastrointestinal: Negative for abdominal pain.  Neurological: Negative for dizziness, tremors, seizures, syncope, facial asymmetry, speech difficulty, weakness, numbness and headaches.    Allergies  Review of patient's allergies indicates no known allergies.  Home Medications    Current Outpatient Rx  Name  Route  Sig  Dispense  Refill  . lisinopril (PRINIVIL,ZESTRIL) 20 MG tablet   Oral   Take 1 tablet (20 mg total) by mouth daily.   30 tablet   1    BP 146/83  Pulse 85  Temp(Src) 98.2 F (36.8 C) (Oral)  Resp 18  SpO2 100% Physical Exam  Nursing note and vitals reviewed. Constitutional: He is oriented to person, place, and time. He appears well-developed and well-nourished. No distress.  HENT:  Head: Normocephalic and atraumatic.  Mouth/Throat: Oropharynx is clear and moist. No oropharyngeal exudate.  Eyes: Conjunctivae are normal. No scleral icterus.  Neck: Neck supple. No JVD present. No thyromegaly present.  Cardiovascular: Normal rate, regular rhythm, normal heart sounds and intact distal pulses.  Exam reveals no gallop and no friction rub.   No murmur heard. Pulmonary/Chest: Effort normal and breath sounds normal.  Abdominal: Soft. He exhibits no mass.  Musculoskeletal: He exhibits no edema.  Lymphadenopathy:    He has no cervical adenopathy.  Neurological: He is alert and oriented to person, place, and time. He has normal reflexes. No cranial nerve deficit. He exhibits normal muscle tone. Coordination normal.  Skin: No rash noted. He is not diaphoretic.    ED Course  Procedures (including critical care time) Labs Reviewed  POCT URINALYSIS DIP (DEVICE)   No results found. 1. Hypertension     MDM  Normal electrolytes and creatinine on point-of-care  i-STAT. Normal physical examination and patient is asymptomatic here.  Refilled lisinopril.  Patient has established followup appointment with a new primary care provider on August 14.  Supportive care and red flags  that should prompt his return to medical attention discussed with patient and provided in writing.   Sharin Grave, MD 08/26/12 [phone]

## 2012-08-21 NOTE — ED Notes (Signed)
Pt c/o HPB, Pt states he has a history of hypertension.  Pt states he went to dentist today and his BP went from 170 to 190 and they told him to get checked out.  Pt states he is fatigued and has numbness in both hands.  Pt has and upcoming appt with a new PCP off of [address]  Appt is on 09/19/12.  Pt is alert and oriented and in no acute distress.   Leilani Able CMA student

## 2012-08-23 MED ORDER — LISINOPRIL-HYDROCHLOROTHIAZIDE 20-25 MG PO TABS: 1.0000 | ORAL_TABLET | Freq: Every day | ORAL | Status: DC

## 2012-08-23 NOTE — ED Provider Notes (Signed)
Addendum: The patient states he was actually on lisinopril/HCTZ 20/25 once daily, so I'll send a refill for this in to his pharmacy, the [facility] and [address].  Reuben Likes, MD 08/23/12 [phone]

## 2013-02-02 ENCOUNTER — Encounter (HOSPITAL_COMMUNITY): Payer: Self-pay | Admitting: Emergency Medicine

## 2013-02-02 ENCOUNTER — Emergency Department (HOSPITAL_COMMUNITY)
Admission: EM | Admit: 2013-02-02 | Discharge: 2013-02-02 | Disposition: A | Discharge status: Home / Self Care | Payer: BC Managed Care – PPO | Attending: Emergency Medicine | Admitting: Emergency Medicine

## 2013-02-02 DIAGNOSIS — L02213 Cutaneous abscess of chest wall: Secondary | ICD-10-CM

## 2013-02-02 DIAGNOSIS — F17200 Nicotine dependence, unspecified, uncomplicated: Secondary | ICD-10-CM | POA: Insufficient documentation

## 2013-02-02 DIAGNOSIS — L02219 Cutaneous abscess of trunk, unspecified: Secondary | ICD-10-CM | POA: Insufficient documentation

## 2013-02-02 DIAGNOSIS — Z79899 Other long term (current) drug therapy: Secondary | ICD-10-CM | POA: Insufficient documentation

## 2013-02-02 DIAGNOSIS — I10 Essential (primary) hypertension: Secondary | ICD-10-CM | POA: Insufficient documentation

## 2013-02-02 MED ORDER — HYDROCODONE-ACETAMINOPHEN 5-325 MG PO TABS: 1.0000 | ORAL_TABLET | Freq: Four times a day (QID) | ORAL | Status: DC | PRN

## 2013-02-02 NOTE — ED Provider Notes (Signed)
Medical screening examination/treatment/procedure(s) were performed by non-physician practitioner and as supervising physician I was immediately available for consultation/collaboration.  EKG Interpretation   None         Lyanne Co, MD 02/02/13 1208

## 2013-02-02 NOTE — ED Notes (Signed)
Per pt, states noticed small lump just below left nipple, has been increasing in size and soreness-hard when palpated

## 2013-02-02 NOTE — ED Provider Notes (Signed)
CSN: [id number]     Arrival date & time 02/02/13  4098 History   First MD Initiated Contact with Patient 02/02/13 [phone]     Chief Complaint  Patient presents with  . lump on breast    (Consider location/radiation/quality/duration/timing/severity/associated sxs/prior Treatment) HPI History presents emergency department with a swollen area below his left breast.  He states that the area is painful to the touch, and is having increased warmth.  Patient, states, that he did not take any medications prior to arrival.  Patient denies fever, chest pain, shortness of breath, nausea, vomiting, weakness, dizziness, or syncope Past Medical History  Diagnosis Date  . Hypertension    History reviewed. No pertinent past surgical history. No family history on file. History  Substance Use Topics  . Smoking status: Current Every Day Smoker    Types: Cigarettes  . Smokeless tobacco: Not on file  . Alcohol Use: No    Review of Systems All other systems negative except as documented in the HPI. All pertinent positives and negatives as reviewed in the HPI. Allergies  Review of patient's allergies indicates no known allergies.  Home Medications   Current Outpatient Rx  Name  Route  Sig  Dispense  Refill  . lisinopril-hydrochlorothiazide (PRINZIDE,ZESTORETIC) 20-25 MG per tablet   Oral   Take 1 tablet by mouth daily.   30 tablet   2   . Multiple Vitamin (MULTIVITAMIN WITH MINERALS) TABS tablet   Oral   Take 1 tablet by mouth daily.          BP 151/93  Pulse 93  Temp(Src) 98.3 F (36.8 C) (Oral)  Resp 14  SpO2 100% Physical Exam  Nursing note and vitals reviewed. Constitutional: He is oriented to person, place, and time. He appears well-developed and well-nourished. No distress.  HENT:  Head: Normocephalic and atraumatic.  Eyes: Pupils are equal, round, and reactive to light.  Cardiovascular: Normal rate, regular rhythm and normal heart sounds.  Exam reveals no gallop and no  friction rub.   No murmur heard. Pulmonary/Chest: Effort normal and breath sounds normal.    Neurological: He is alert and oriented to person, place, and time.  Skin: Skin is warm and dry.    ED Course  Procedures (including critical care time) INCISION AND DRAINAGE Performed by: Carlyle Dolly Consent: Verbal consent obtained. Risks and benefits: risks, benefits and alternatives were discussed Type: abscess  Body area: Left rib cage area, just below the left breast  Anesthesia: local infiltration  Incision was made with a scalpel.  Local anesthetic: lidocaine 2 % with epinephrine  Anesthetic total: 4 ml  Complexity: complex Blunt dissection to break up loculations  Drainage: purulent  Drainage amount: Large   Packing material: None   Patient tolerance: Patient tolerated the procedure well with no immediate complications.   Patient doesn't have any surrounding cellulitis or erythema.  Patient is advised to keep area clean and dry.  Keep the area covered as well.  Told to return here as needed.  Told to use warm compresses over the area  [employer], PA-C 02/02/13 1201

## 2013-06-27 ENCOUNTER — Other Ambulatory Visit: Payer: Self-pay | Admitting: Internal Medicine

## 2013-06-27 DIAGNOSIS — N63 Unspecified lump in unspecified breast: Secondary | ICD-10-CM

## 2013-07-11 ENCOUNTER — Encounter (INDEPENDENT_AMBULATORY_CARE_PROVIDER_SITE_OTHER): Payer: Self-pay

## 2013-07-11 ENCOUNTER — Ambulatory Visit
Admission: RE | Admit: 2013-07-11 | Discharge: 2013-07-11 | Disposition: A | Discharge status: Home / Self Care | Payer: BC Managed Care – PPO | Source: Ambulatory Visit | Attending: Internal Medicine | Admitting: Internal Medicine

## 2013-07-11 ENCOUNTER — Other Ambulatory Visit: Payer: Self-pay | Admitting: Internal Medicine

## 2013-07-11 ENCOUNTER — Ambulatory Visit: Admission: RE | Admit: 2013-07-11 | Payer: BC Managed Care – PPO | Source: Ambulatory Visit

## 2013-07-11 DIAGNOSIS — N63 Unspecified lump in unspecified breast: Secondary | ICD-10-CM

## 2013-08-07 ENCOUNTER — Ambulatory Visit
Admission: RE | Admit: 2013-08-07 | Discharge: 2013-08-07 | Disposition: A | Discharge status: Home / Self Care | Payer: BC Managed Care – PPO | Source: Ambulatory Visit | Attending: Internal Medicine | Admitting: Internal Medicine

## 2013-08-07 DIAGNOSIS — N63 Unspecified lump in unspecified breast: Secondary | ICD-10-CM

## 2013-10-18 ENCOUNTER — Emergency Department (HOSPITAL_COMMUNITY): Payer: BC Managed Care – PPO

## 2013-10-18 ENCOUNTER — Encounter (HOSPITAL_COMMUNITY): Payer: Self-pay | Admitting: Emergency Medicine

## 2013-10-18 ENCOUNTER — Inpatient Hospital Stay (HOSPITAL_COMMUNITY)
Admission: EM | Admit: 2013-10-18 | Discharge: 2013-10-21 | DRG: 247 | Disposition: A | Discharge status: Home / Self Care | Payer: BC Managed Care – PPO | Attending: Cardiology | Admitting: Cardiology

## 2013-10-18 DIAGNOSIS — Z823 Family history of stroke: Secondary | ICD-10-CM

## 2013-10-18 DIAGNOSIS — E785 Hyperlipidemia, unspecified: Secondary | ICD-10-CM | POA: Diagnosis present

## 2013-10-18 DIAGNOSIS — R079 Chest pain, unspecified: Secondary | ICD-10-CM | POA: Diagnosis present

## 2013-10-18 DIAGNOSIS — I2510 Atherosclerotic heart disease of native coronary artery without angina pectoris: Secondary | ICD-10-CM | POA: Diagnosis present

## 2013-10-18 DIAGNOSIS — Z7982 Long term (current) use of aspirin: Secondary | ICD-10-CM | POA: Diagnosis not present

## 2013-10-18 DIAGNOSIS — I214 Non-ST elevation (NSTEMI) myocardial infarction: Secondary | ICD-10-CM | POA: Diagnosis present

## 2013-10-18 DIAGNOSIS — Z9861 Coronary angioplasty status: Secondary | ICD-10-CM

## 2013-10-18 DIAGNOSIS — F17200 Nicotine dependence, unspecified, uncomplicated: Secondary | ICD-10-CM | POA: Diagnosis present

## 2013-10-18 DIAGNOSIS — Z8249 Family history of ischemic heart disease and other diseases of the circulatory system: Secondary | ICD-10-CM | POA: Diagnosis not present

## 2013-10-18 DIAGNOSIS — R7989 Other specified abnormal findings of blood chemistry: Secondary | ICD-10-CM

## 2013-10-18 DIAGNOSIS — I2129 ST elevation (STEMI) myocardial infarction involving other sites: Secondary | ICD-10-CM

## 2013-10-18 DIAGNOSIS — I10 Essential (primary) hypertension: Secondary | ICD-10-CM | POA: Diagnosis present

## 2013-10-18 DIAGNOSIS — I2121 ST elevation (STEMI) myocardial infarction involving left circumflex coronary artery: Secondary | ICD-10-CM

## 2013-10-18 DIAGNOSIS — Z955 Presence of coronary angioplasty implant and graft: Secondary | ICD-10-CM

## 2013-10-18 DIAGNOSIS — Z791 Long term (current) use of non-steroidal anti-inflammatories (NSAID): Secondary | ICD-10-CM | POA: Diagnosis not present

## 2013-10-18 HISTORY — DX: Hyperlipidemia, unspecified: E78.5

## 2013-10-18 LAB — BASIC METABOLIC PANEL
ANION GAP: 12 (ref 5–15)
BUN: 9 mg/dL (ref 6–23)
CALCIUM: 9.5 mg/dL (ref 8.4–10.5)
CO2: 24 meq/L (ref 19–32)
CREATININE: 0.81 mg/dL (ref 0.50–1.35)
Chloride: 103 mEq/L (ref 96–112)
Glucose, Bld: 78 mg/dL (ref 70–99)
Potassium: 4.4 mEq/L (ref 3.7–5.3)
SODIUM: 139 meq/L (ref 137–147)

## 2013-10-18 LAB — CBC
HCT: 43.4 % (ref 39.0–52.0)
Hemoglobin: 15.1 g/dL (ref 13.0–17.0)
MCH: 28.5 pg (ref 26.0–34.0)
MCHC: 34.8 g/dL (ref 30.0–36.0)
MCV: 81.9 fL (ref 78.0–100.0)
PLATELETS: 263 10*3/uL (ref 150–400)
RBC: 5.3 MIL/uL (ref 4.22–5.81)
RDW: 14.9 % (ref 11.5–15.5)
WBC: 4.3 10*3/uL (ref 4.0–10.5)

## 2013-10-18 LAB — CK TOTAL AND CKMB (NOT AT ARMC)
CK TOTAL: 635 U/L — AB (ref 7–232)
CK, MB: 7.8 ng/mL (ref 0.3–4.0)
Relative Index: 1.2 (ref 0.0–2.5)

## 2013-10-18 LAB — PROTIME-INR
INR: 0.94 (ref 0.00–1.49)
Prothrombin Time: 12.6 seconds (ref 11.6–15.2)

## 2013-10-18 LAB — HEPATIC FUNCTION PANEL
ALK PHOS: 51 U/L (ref 39–117)
ALT: 22 U/L (ref 0–53)
AST: 38 U/L — AB (ref 0–37)
Albumin: 4 g/dL (ref 3.5–5.2)
BILIRUBIN TOTAL: 0.3 mg/dL (ref 0.3–1.2)
Total Protein: 7.9 g/dL (ref 6.0–8.3)

## 2013-10-18 LAB — TROPONIN I
TROPONIN I: 1.11 ng/mL — AB (ref <0.30)
Troponin I: 0.96 ng/mL (ref <0.30)

## 2013-10-18 LAB — LIPID PANEL
Cholesterol: 283 mg/dL — ABNORMAL HIGH (ref 0–200)
HDL: 78 mg/dL (ref >39)
LDL CALC: 182 mg/dL — AB (ref 0–99)
Total CHOL/HDL Ratio: 3.6 RATIO
Triglycerides: 117 mg/dL (ref <150)
VLDL: 23 mg/dL (ref 0–40)

## 2013-10-18 LAB — MAGNESIUM: MAGNESIUM: 2.4 mg/dL (ref 1.5–2.5)

## 2013-10-18 LAB — I-STAT TROPONIN, ED: TROPONIN I, POC: 0.72 ng/mL — AB (ref 0.00–0.08)

## 2013-10-18 LAB — TSH: TSH: 0.276 u[IU]/mL — ABNORMAL LOW (ref 0.350–4.500)

## 2013-10-18 IMAGING — CR DG CHEST 2V
2 series · 2 of 2 positions shown · non-contrast
Comparison: None.

CLINICAL DATA: Chest pain and hypertension for 2 days

EXAM:
CHEST  2 VIEW

[w chest pa]
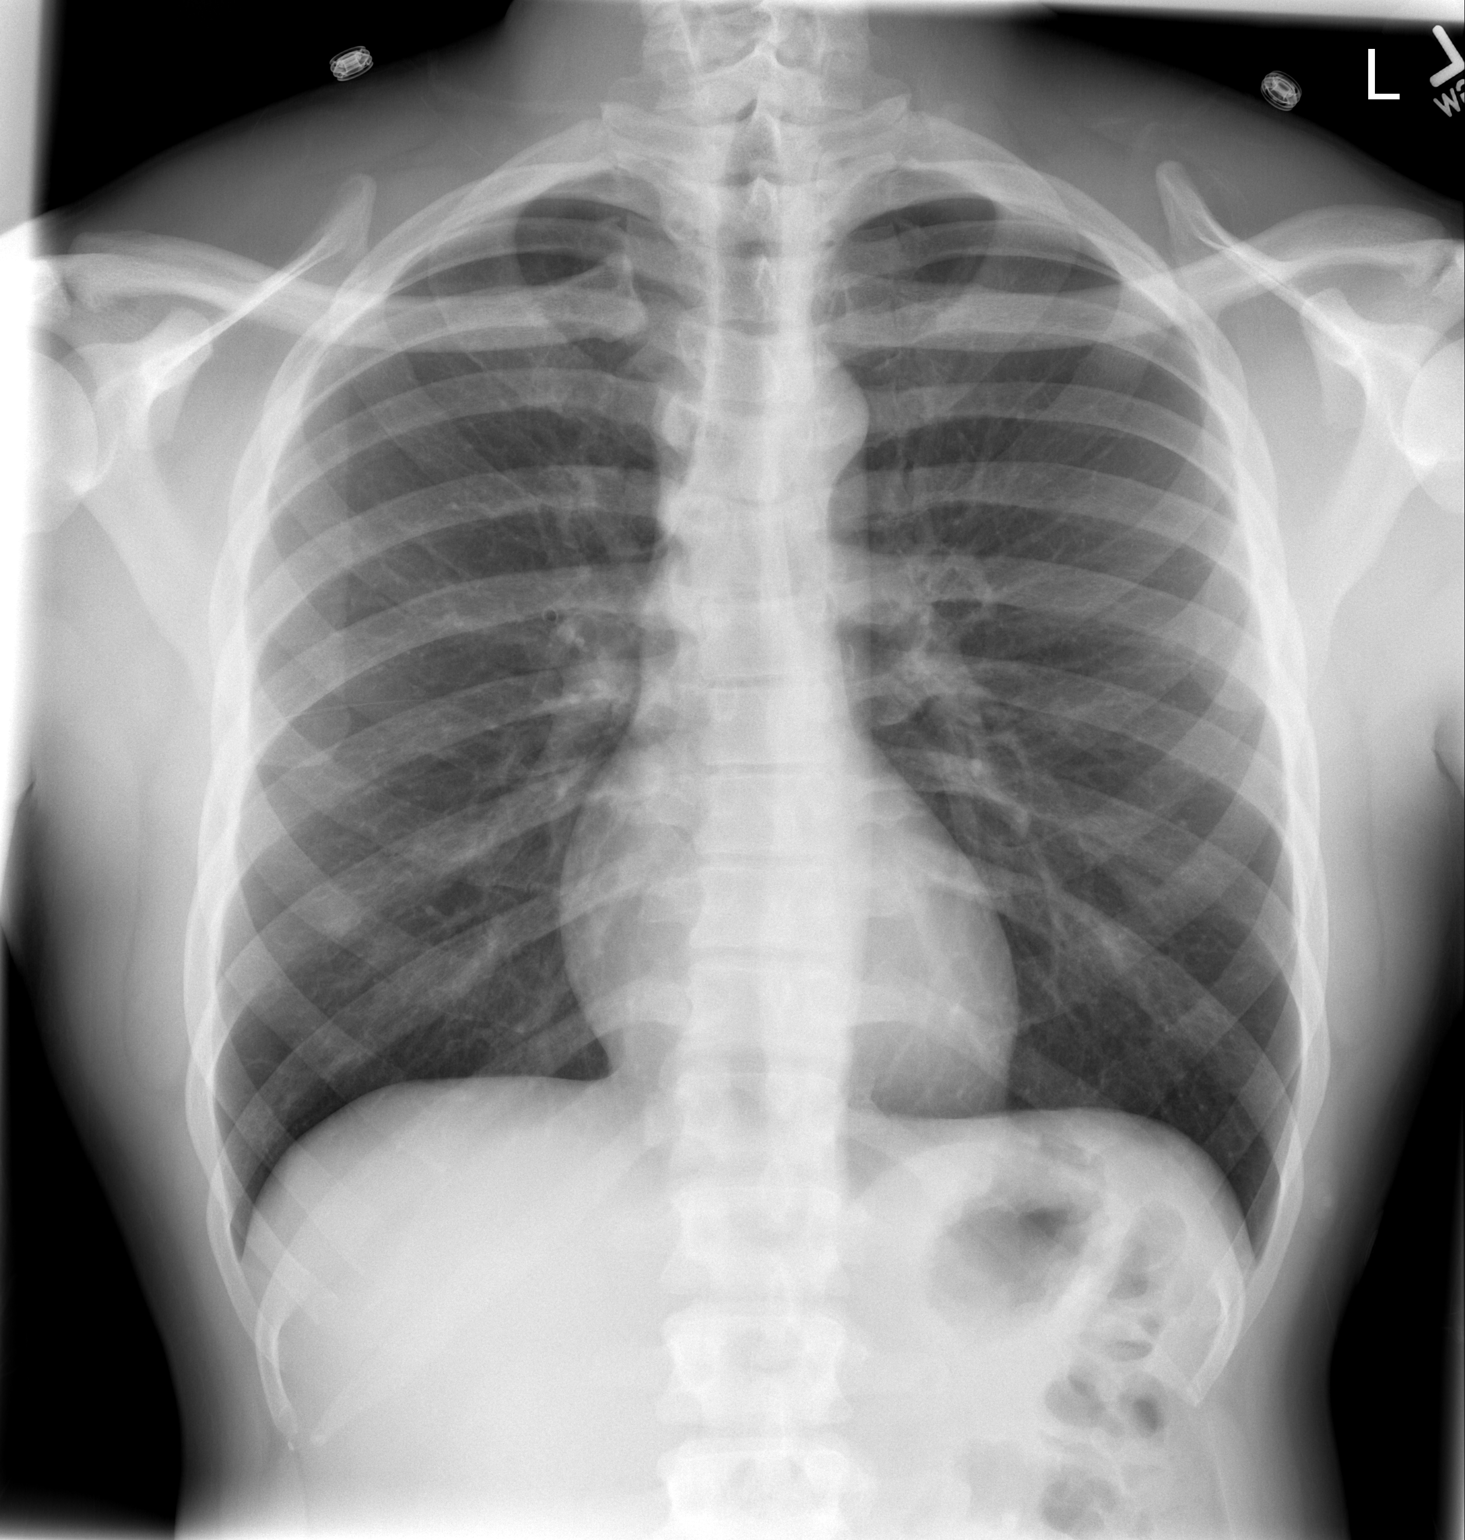

[w chest lat]
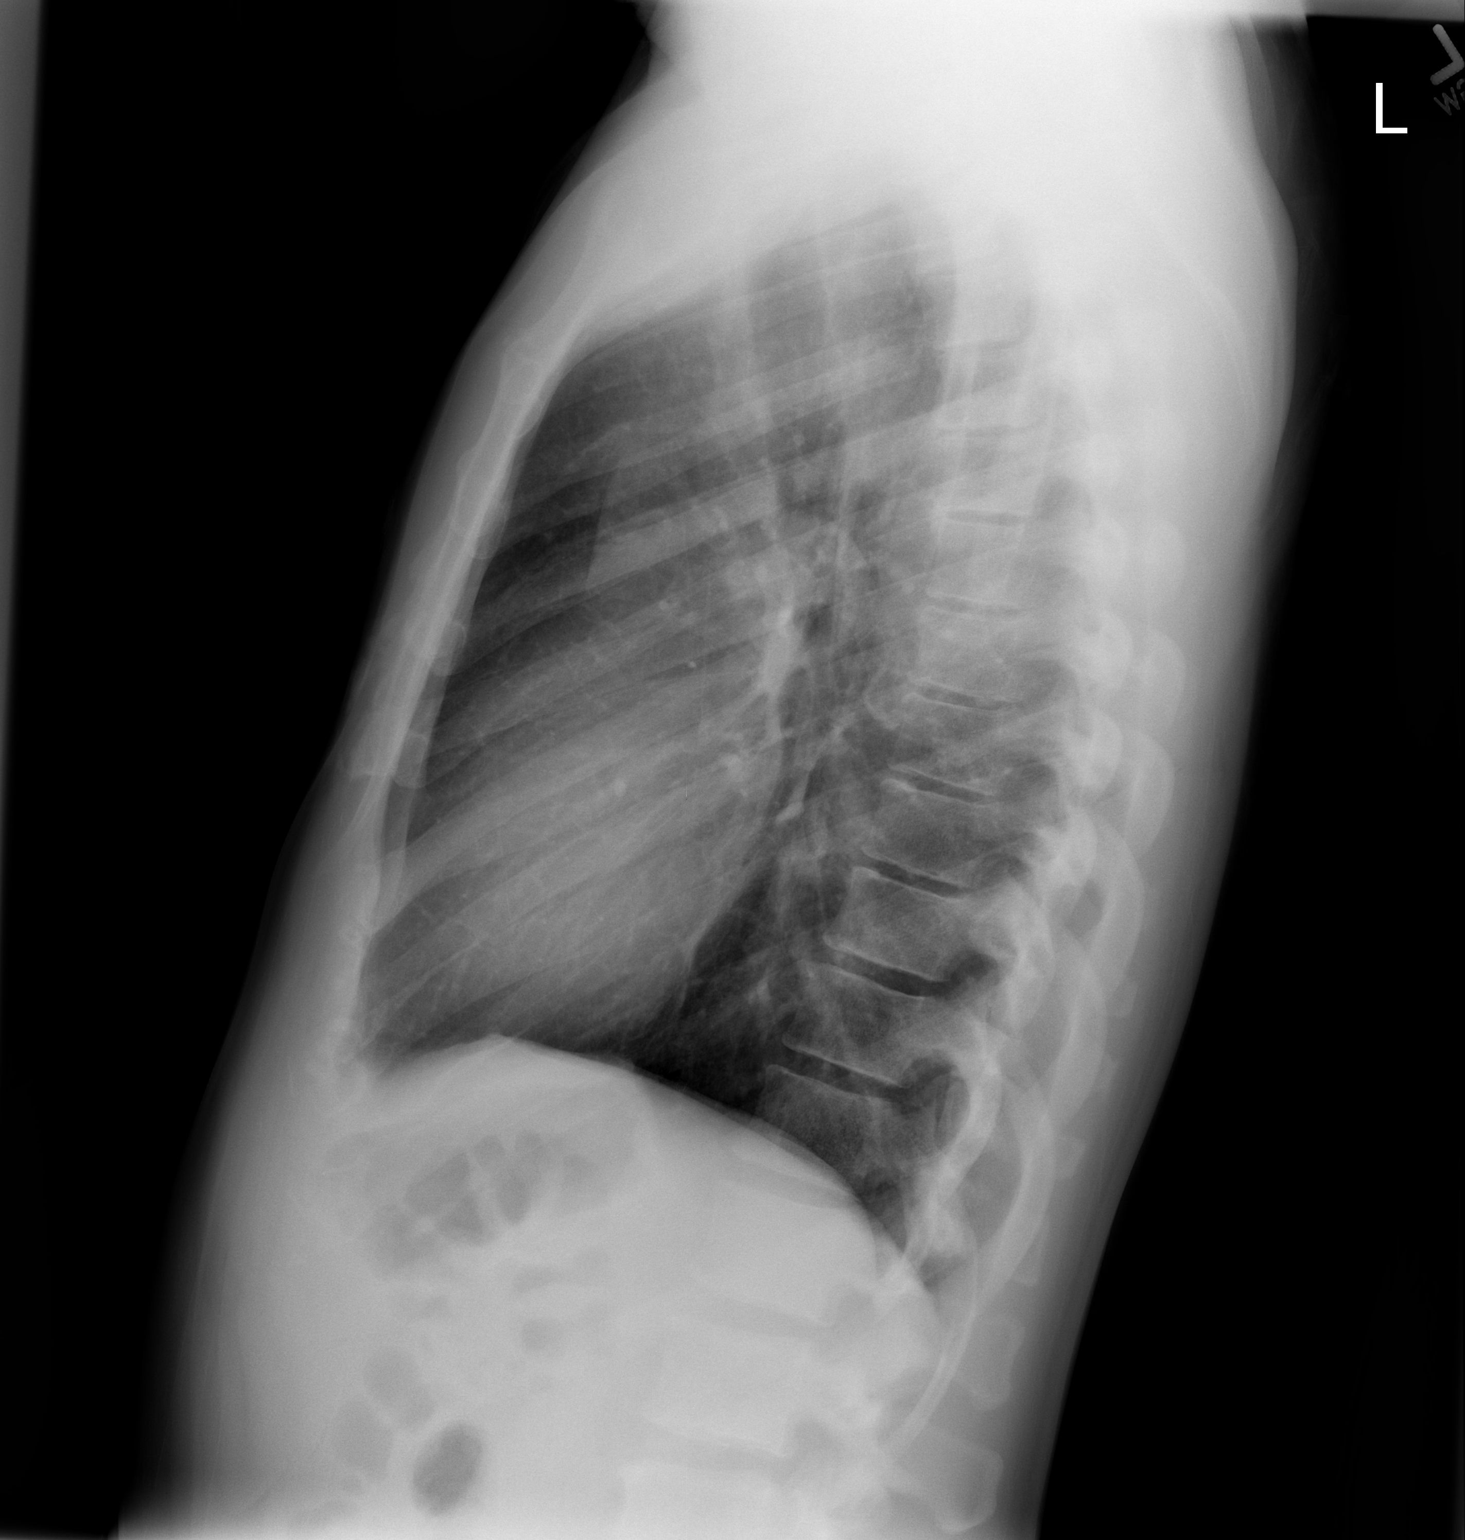

[2 of 2 positions shown; findings below may reference images not displayed]

FINDINGS: The heart size and mediastinal contours are within normal limits.
Both lungs are clear. The visualized skeletal structures are
unremarkable.
IMPRESSION: No active cardiopulmonary disease.

## 2013-10-18 MED ORDER — ACETAMINOPHEN 325 MG PO TABS
650.0000 mg | ORAL_TABLET | ORAL | Status: DC | PRN
Start: 2013-10-18 — End: 2013-10-21
  Administered 2013-10-20: 650 mg via ORAL
  Filled 2013-10-18: qty 2

## 2013-10-18 MED ORDER — ASPIRIN EC 81 MG PO TBEC
81.0000 mg | DELAYED_RELEASE_TABLET | Freq: Every day | ORAL | Status: DC
  Administered 2013-10-19: 81 mg via ORAL
  Filled 2013-10-18 (×2): qty 1

## 2013-10-18 MED ORDER — SODIUM CHLORIDE 0.9 % IJ SOLN: 3.0000 mL | INTRAMUSCULAR | Status: DC | PRN

## 2013-10-18 MED ORDER — METOPROLOL TARTRATE 12.5 MG HALF TABLET
12.5000 mg | ORAL_TABLET | Freq: Two times a day (BID) | ORAL | Status: DC
  Administered 2013-10-18 – 2013-10-20 (×5): 12.5 mg via ORAL
  Filled 2013-10-18 (×7): qty 1

## 2013-10-18 MED ORDER — HEPARIN (PORCINE) IN NACL 100-0.45 UNIT/ML-% IJ SOLN
1250.0000 [IU]/h | INTRAMUSCULAR | Status: DC
  Administered 2013-10-18: 900 [IU]/h via INTRAVENOUS
  Administered 2013-10-19: 1200 [IU]/h via INTRAVENOUS
  Filled 2013-10-18 (×6): qty 250

## 2013-10-18 MED ORDER — ASPIRIN 81 MG PO CHEW
324.0000 mg | CHEWABLE_TABLET | Freq: Once | ORAL | Status: AC
Start: 2013-10-18 — End: 2013-10-18
  Administered 2013-10-18: 324 mg via ORAL
  Filled 2013-10-18: qty 4

## 2013-10-18 MED ORDER — ALPRAZOLAM 0.25 MG PO TABS: 0.2500 mg | ORAL_TABLET | Freq: Two times a day (BID) | ORAL | Status: DC | PRN

## 2013-10-18 MED ORDER — ATORVASTATIN CALCIUM 80 MG PO TABS
80.0000 mg | ORAL_TABLET | Freq: Every day | ORAL | Status: DC
  Administered 2013-10-18 – 2013-10-20 (×3): 80 mg via ORAL
  Filled 2013-10-18 (×4): qty 1

## 2013-10-18 MED ORDER — ZOLPIDEM TARTRATE 5 MG PO TABS: 5.0000 mg | ORAL_TABLET | Freq: Every evening | ORAL | Status: DC | PRN

## 2013-10-18 MED ORDER — HEPARIN BOLUS VIA INFUSION
4000.0000 [IU] | Freq: Once | INTRAVENOUS | Status: AC
  Administered 2013-10-18: 4000 [IU] via INTRAVENOUS
  Filled 2013-10-18: qty 4000

## 2013-10-18 MED ORDER — NITROGLYCERIN IN D5W 200-5 MCG/ML-% IV SOLN
3.0000 ug/min | INTRAVENOUS | Status: DC
  Administered 2013-10-18: 5 ug/min via INTRAVENOUS
  Administered 2013-10-21: 02:00:00 10 ug/min via INTRAVENOUS
  Filled 2013-10-18: qty 250

## 2013-10-18 MED ORDER — SODIUM CHLORIDE 0.9 % IV SOLN: 250.0000 mL | INTRAVENOUS | Status: DC | PRN

## 2013-10-18 MED ORDER — NITROGLYCERIN 0.4 MG SL SUBL: 0.4000 mg | SUBLINGUAL_TABLET | SUBLINGUAL | Status: DC | PRN

## 2013-10-18 MED ORDER — ONDANSETRON HCL 4 MG/2ML IJ SOLN: 4.0000 mg | Freq: Four times a day (QID) | INTRAMUSCULAR | Status: DC | PRN

## 2013-10-18 MED ORDER — SODIUM CHLORIDE 0.9 % IJ SOLN
3.0000 mL | Freq: Two times a day (BID) | INTRAMUSCULAR | Status: DC
  Administered 2013-10-20: 22:00:00 3 mL via INTRAVENOUS

## 2013-10-18 NOTE — Progress Notes (Addendum)
CRITICAL VALUE ALERT  Critical value received:  Trop 1.11 CK, MB 7.8  Date of notification:  10/18/13  Time of notification:  1532  Critical value read back:Yes.    Nurse who received alert:  Lynetta Mare, RN  MD notified (1st page):  Theodore Demark NP  Time of first page:  1533  MD notified (2nd page): Theodore Demark NP  Time of second page: 1544  Responding MD:  Theodore Demark NP  Time MD responded:  1544  NP stated to monitor patients pain and let her know of any changes.

## 2013-10-18 NOTE — Progress Notes (Signed)
Assessed patients pain; pt denies SOB, CP-pressure, tightness; pt ambulating in room; will continue to monitor and assess.  Hermina Barters, RN

## 2013-10-18 NOTE — ED Notes (Signed)
Attempted IV access x2.  ?

## 2013-10-18 NOTE — Progress Notes (Signed)
ANTICOAGULATION CONSULT NOTE - Initial Consult  Pharmacy Consult for Heparin Indication: chest pain/ACS  No Known Allergies  Patient Measurements: Height:  (175.3 cm) Weight: 159 lb 2.8 oz (72.2 kg) IBW/kg (Calculated) : 70.7 Heparin Dosing Weight: 72 kg  Vital Signs: Temp: 98.3 F (36.8 C) (09/12 1517) Temp src: Oral (09/12 1517) BP: 152/92 mmHg (09/12 1517) Pulse Rate: 84 (09/12 1435)  Labs:  Recent Labs  10/18/13 1136 10/18/13 1348  HGB 15.1  --   HCT 43.4  --   PLT 263  --   CREATININE 0.81  --   CKTOTAL  --  635*  CKMB  --  7.8*  TROPONINI  --  1.11*    Estimated Creatinine Clearance: 116.4 ml/min (by C-G formula based on Cr of 0.81).   Medical History: Past Medical History  Diagnosis Date  . Hypertension   . Chronic chest pain   . Hyperlipidemia LDL goal <70     Assessment: 31 YOM who presented to the [facility] on 9/12 with c/o CP and bump in troponins >> NSTEMI. Pharmacy was consulted to start heparin for anticoagulation while awaiting further cardiology work-up.   The patient was on a baby ASA PTA, no recent surgeries or hx CVA noted, baseline CBC wnl. Hep Wt: 72 kg  Goal of Therapy:  Heparin level 0.3-0.7 units/ml Monitor platelets by anticoagulation protocol: Yes   Plan:  1. Heparin bolus of 4000 units x 1 2. Initiate heparin at a drip rate of 900 units/hr (9 ml/hr) 3. Daily heparin levels 4. Will continue to monitor for any signs/symptoms of bleeding and will follow up with heparin level in 6 hours   Georgina Pillion, PharmD, BCPS Clinical Pharmacist Pager: [phone] 10/18/2013 4:21 PM

## 2013-10-18 NOTE — Progress Notes (Signed)
Gave pt VIS on pneumonia and flu; discussed with pt about watching some educational videos on CP and cath procedure.  Hermina Barters, RN

## 2013-10-18 NOTE — ED Notes (Signed)
Results of troponin given to Dr. McManus 

## 2013-10-18 NOTE — ED Provider Notes (Signed)
CSN: [id number]     Arrival date & time 10/18/13  1039 History   First MD Initiated Contact with Patient 10/18/13 1119     Chief Complaint  Patient presents with  . Chest Pain     (Consider location/radiation/quality/duration/timing/severity/associated sxs/prior Treatment) HPI Comments: Pt states that he has been having intermittent substernal cp times 6 months. States that he gets it about once a week and it usually lasts about 1 hours. He states that this episode started yesterday morning and is just about gone. States that he does get sweaty and feel sob with the episodes. Denies vomiting. States that nothing makes the pain better or worse. Was admitted for 2 night and stressed at [facility] 2 months ago and nothing was found. Hasn't taken anything for the symptoms.states that he has tingling in his fingers and when the episodes are over he feels tired.  The history is provided by the patient. No language interpreter was used.    Past Medical History  Diagnosis Date  . Hypertension   . Chronic chest pain    History reviewed. No pertinent past surgical history. No family history on file. History  Substance Use Topics  . Smoking status: Current Every Day Smoker    Types: Cigarettes  . Smokeless tobacco: Not on file  . Alcohol Use: No    Review of Systems  Constitutional: Negative.   Respiratory: Positive for shortness of breath.   Cardiovascular: Negative for chest pain.      Allergies  Review of patient's allergies indicates no known allergies.  Home Medications   Prior to Admission medications   Medication Sig Start Date End Date Taking? Authorizing Provider  HYDROcodone-acetaminophen (NORCO/VICODIN) 5-325 MG per tablet Take 1 tablet by mouth every 6 (six) hours as needed for moderate pain. 02/02/13   Jamesetta Orleans Lawyer, PA-C  lisinopril-hydrochlorothiazide (PRINZIDE,ZESTORETIC) 20-25 MG per tablet Take 1 tablet by mouth daily. 08/23/12   Reuben Likes, MD   Multiple Vitamin (MULTIVITAMIN WITH MINERALS) TABS tablet Take 1 tablet by mouth daily.    Historical Provider, MD   BP 160/95  Pulse 87  Resp 14  Ht  (1.753 m)  Wt 159 lb (72.122 kg)  BMI 23.47 kg/m2  SpO2 100% Physical Exam  Nursing note and vitals reviewed. Constitutional: He is oriented to person, place, and time. He appears well-developed and well-nourished.  HENT:  Head: Normocephalic and atraumatic.  Cardiovascular: Normal rate and regular rhythm.   Pulmonary/Chest: Effort normal and breath sounds normal.  Abdominal: Soft. Bowel sounds are normal. There is no tenderness.  Musculoskeletal: Normal range of motion.  Neurological: He is alert and oriented to person, place, and time. Coordination normal.  Skin: Skin is warm and dry.    ED Course  Procedures (including critical care time) Labs Review Labs Reviewed  I-STAT TROPOININ, ED - Abnormal; Notable for the following:    Troponin i, poc 0.72 (*)    All other components within normal limits  CBC  BASIC METABOLIC PANEL    Imaging Review No results found.   EKG Interpretation   Date/Time:  Saturday October 18 2013 10:46:25 EDT Ventricular Rate:  91 PR Interval:  168 QRS Duration: 88 QT Interval:  342 QTC Calculation: 420 R Axis:   80 Text Interpretation:  Normal sinus rhythm Normal ECG When compared with  ECG of 02/11/2011 No significant change was found Confirmed by [facility]  MD,  KATHLEEN ([id number]) on 10/18/2013 11:34:08 AM      MDM  Final diagnoses:  NSTEMI (non-ST elevated myocardial infarction)    12:27 PM Pt is pain free. Cardiology to see pt  And admit  Teressa Lower, NP 10/18/13 1424

## 2013-10-18 NOTE — ED Notes (Signed)
Cardiology at bedside.

## 2013-10-18 NOTE — ED Notes (Signed)
Pt. Stated, Joseph Carr been having chest pain off and on for 6 months.

## 2013-10-18 NOTE — H&P (Signed)
History and Physical   Patient ID: Joseph Carr MRN: [id number], DOB/AGE: 44-Oct-1972 44 y.o. Date of Encounter: 10/18/2013  Primary Physician: Deboraha Sprang MD on [facility] Primary Cardiologist: None  Chief Complaint:  Chest pain  HPI: Joseph Carr is a 43 y.o. male with no history of CAD. He has been having chest pain with exertion 1-2 x week for months. He was admitted a few months ago and evaluated at [facility] with a stress test, records pending. He continued to have episodes of chest pain regularly. 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 He would stop and the episodes would resolve, without medical intervention. He would feel tired afterwards. Sometimes the episodes were associated with SOB and diaphoresis, but not always.   Yesterday, he had the pain as usual. He rested and it went away. However, it came back strongly, a 10/10, and was associated with SOB and diaphoresis, but no N&V. He took aspirin, and went home. He rested and the pain resolved in approximately 2-1/2 hours. He was feeling bad again this am, and came to the ER. He is having some mild discomfort, but does not appear uncomfortable at rest. He has never had pain this bad or that lasted this long before.  Past Medical History  Diagnosis Date  . Hypertension   . Chronic chest pain   . Hyperlipidemia LDL goal <70     Surgical History:  Past Surgical History  Procedure Laterality Date  . Finger fracture surgery Left 2014    4th finger     I have reviewed the patient's current medications. Prior to Admission medications   Medication Sig Start Date End Date Taking? Authorizing Provider  aspirin EC 81 MG tablet Take 81 mg by mouth daily.   Yes Historical Provider, MD  lisinopril-hydrochlorothiazide (PRINZIDE,ZESTORETIC) 20-25 MG per tablet Take 1 tablet by mouth daily. 08/23/12  Yes Reuben Likes, MD   Allergies: No Known Allergies  History   Social History  .  Marital Status: Married    Spouse Name: N/A    Number of Children: N/A  . Years of Education: N/A   Occupational History  . Works in Orthoptist    Social History Main Topics  . Smoking status: Current Every Day Smoker -- 0.75 packs/day for 30 years    Types: Cigarettes  . Smokeless tobacco: Never Used  . Alcohol Use: Yes     Comment: 6-12 beers on Fridays  . Drug Use: No  . Sexual Activity: Not on file   Other Topics Concern  . Not on file   Social History Narrative   Lives with family    Family History  Problem Relation Age of Onset  . Heart attack Father   . Hypertension Father   . Stroke Father    Family Status  Relation Status Death Age  . Father Deceased 10    MI and CVA  . Mother Deceased 44s    lung dz    Review of Systems:   Full 14-point review of systems otherwise negative except as noted above.  Physical Exam: Blood pressure 131/84, pulse 81, resp. rate 14, height  (1.753 m), weight 159 lb (72.122 kg), SpO2 100.00%. General: Well developed, well nourished,male in no acute distress. Head: Normocephalic, atraumatic, sclera non-icteric, no xanthomas, nares are without discharge. Dentition: poor Neck: No carotid bruits. JVD not elevated. No thyromegally Lungs: Good expansion bilaterally. without wheezes or rhonchi.  Heart: Regular rate  and rhythm with S1 S2.  No S3 or S4.  No murmur, no rubs, or gallops appreciated. Abdomen: Soft, non-tender, non-distended with normoactive bowel sounds. No hepatomegaly. No rebound/guarding. No obvious abdominal masses. Msk:  Strength and tone appear normal for age. No joint deformities or effusions, no spine or costo-vertebral angle tenderness. Extremities: No clubbing or cyanosis. No edema.  Distal pedal pulses are 2+ in 4 extrem Neuro: Alert and oriented X 3. Moves all extremities spontaneously. No focal deficits noted. Psych:  Responds to questions appropriately with a normal affect. Skin: No rashes or lesions  noted  Labs:   Lab Results  Component Value Date   WBC 4.3 10/18/2013   HGB 15.1 10/18/2013   HCT 43.4 10/18/2013   MCV 81.9 10/18/2013   PLT 263 10/18/2013     Recent Labs Lab 10/18/13 1136  NA 139  K 4.4  CL 103  CO2 24  BUN 9  CREATININE 0.81  CALCIUM 9.5  GLUCOSE 78    Recent Labs  10/18/13 1141  TROPIPOC 0.72*   Radiology/Studies: Dg Chest 2 View 10/18/2013   CLINICAL DATA:  Chest pain and hypertension for 2 days  EXAM: CHEST  2 VIEW  COMPARISON:  None.  FINDINGS: The heart size and mediastinal contours are within normal limits. Both lungs are clear. The visualized skeletal structures are unremarkable.  IMPRESSION: No active cardiopulmonary disease.   Electronically Signed   By: Esperanza Heir M.D.   On: 10/18/2013 13:20   ECG: 10/18/2013 SR, ?early repol, ?LVH Vent. rate 91 BPM PR interval 168 ms QRS duration 88 ms QT/QTc 342/420 ms P-R-T axes 85 80 75  ASSESSMENT AND PLAN:  Principal Problem:   Non-ST elevation myocardial infarction (NSTEMI), initial episode of care - IV Nitro, heparin, ASA, BB, statin. Cath Monday. The risks and benefits of a cardiac catheterization including, but not limited to, death, stroke, MI, kidney damage and bleeding were discussed with the patient and his wife, who indicate understanding and agree to proceed.   Active Problems:   Hypertension - add BB and follow    Hyperlipidemia LDL goal <70 - change to high-dose statin     Tobacco use/ETOH use - cessation encouraged, follow, watch for withdrawal.   Signed, Theodore Demark, PA-C 10/18/2013 2:01 PM Beeper [phone]

## 2013-10-19 DIAGNOSIS — R7989 Other specified abnormal findings of blood chemistry: Secondary | ICD-10-CM | POA: Diagnosis present

## 2013-10-19 LAB — BASIC METABOLIC PANEL
ANION GAP: 12 (ref 5–15)
Anion gap: 12 (ref 5–15)
BUN: 19 mg/dL (ref 6–23)
BUN: 14 mg/dL (ref 6–23)
CHLORIDE: 101 meq/L (ref 96–112)
CO2: 23 meq/L (ref 19–32)
CO2: 24 mEq/L (ref 19–32)
Calcium: 8.8 mg/dL (ref 8.4–10.5)
Calcium: 9.4 mg/dL (ref 8.4–10.5)
Chloride: 102 mEq/L (ref 96–112)
Creatinine, Ser: 0.95 mg/dL (ref 0.50–1.35)
Creatinine, Ser: 0.89 mg/dL (ref 0.50–1.35)
GFR calc Af Amer: >90 mL/min (ref >90)
GFR calc Af Amer: >90 mL/min (ref >90)
GFR calc non Af Amer: >90 mL/min (ref >90)
GLUCOSE: 101 mg/dL — AB (ref 70–99)
Glucose, Bld: 141 mg/dL — ABNORMAL HIGH (ref 70–99)
POTASSIUM: 4.2 meq/L (ref 3.7–5.3)
Potassium: 4.1 mEq/L (ref 3.7–5.3)
SODIUM: 138 meq/L (ref 137–147)
Sodium: 136 mEq/L — ABNORMAL LOW (ref 137–147)

## 2013-10-19 LAB — CBC
HCT: 40.3 % (ref 39.0–52.0)
HEMOGLOBIN: 13.7 g/dL (ref 13.0–17.0)
MCH: 28 pg (ref 26.0–34.0)
MCHC: 34 g/dL (ref 30.0–36.0)
MCV: 82.2 fL (ref 78.0–100.0)
Platelets: 246 10*3/uL (ref 150–400)
RBC: 4.9 MIL/uL (ref 4.22–5.81)
RDW: 14.8 % (ref 11.5–15.5)
WBC: 4.6 10*3/uL (ref 4.0–10.5)

## 2013-10-19 LAB — T3 UPTAKE: T3 UPTAKE RATIO: 30 % (ref 22.0–35.0)

## 2013-10-19 LAB — HEPARIN LEVEL (UNFRACTIONATED)
HEPARIN UNFRACTIONATED: 0.45 [IU]/mL (ref 0.30–0.70)
HEPARIN UNFRACTIONATED: 0.3 [IU]/mL (ref 0.30–0.70)
Heparin Unfractionated: <0.1 IU/mL — ABNORMAL LOW (ref 0.30–0.70)

## 2013-10-19 LAB — TROPONIN I: Troponin I: 0.56 ng/mL (ref <0.30)

## 2013-10-19 LAB — PROTIME-INR
INR: 0.89 (ref 0.00–1.49)
PROTHROMBIN TIME: 12 s (ref 11.6–15.2)

## 2013-10-19 MED ORDER — ASPIRIN 81 MG PO CHEW
81.0000 mg | CHEWABLE_TABLET | ORAL | Status: AC
  Administered 2013-10-20: 81 mg via ORAL
  Filled 2013-10-19: qty 1

## 2013-10-19 MED ORDER — HEPARIN BOLUS VIA INFUSION
2000.0000 [IU] | Freq: Once | INTRAVENOUS | Status: AC
  Administered 2013-10-19: 2000 [IU] via INTRAVENOUS
  Filled 2013-10-19: qty 2000

## 2013-10-19 MED ORDER — SODIUM CHLORIDE 0.9 % IJ SOLN: 3.0000 mL | INTRAMUSCULAR | Status: DC | PRN

## 2013-10-19 MED ORDER — SODIUM CHLORIDE 0.9 % IJ SOLN: 3.0000 mL | Freq: Two times a day (BID) | INTRAMUSCULAR | Status: DC

## 2013-10-19 MED ORDER — SODIUM CHLORIDE 0.9 % IV SOLN
INTRAVENOUS | Status: DC
  Administered 2013-10-20: 04:00:00 via INTRAVENOUS

## 2013-10-19 MED ORDER — SODIUM CHLORIDE 0.9 % IV SOLN: 250.0000 mL | INTRAVENOUS | Status: DC | PRN

## 2013-10-19 NOTE — Progress Notes (Addendum)
ANTICOAGULATION CONSULT NOTE - Follow Up Consult  Pharmacy Consult for Heparin Indication: chest pain/ACS  No Known Allergies  Patient Measurements: Height:  (175.3 cm) Weight: 158 lb 9.6 oz (71.94 kg) IBW/kg (Calculated) : 70.7 Heparin Dosing Weight: 72 kg  Vital Signs: Temp: 97.8 F (36.6 C) (09/13 0300) Temp src: Oral (09/13 0300) BP: 131/84 mmHg (09/13 0300) Pulse Rate: 69 (09/13 0300)  Labs:  Recent Labs  10/18/13 1136 10/18/13 1348 10/18/13 1919 10/18/13 2332 10/19/13 0250 10/19/13 0749  HGB 15.1  --   --   --   --   --   HCT 43.4  --   --   --   --   --   PLT 263  --   --   --   --   --   LABPROT  --   --  12.6  --   --   --   INR  --   --  0.94  --   --   --   HEPARINUNFRC  --   --   --  <0.10*  --  0.45  CREATININE 0.81  --   --   --  0.95  --   CKTOTAL  --  635*  --   --   --   --   CKMB  --  7.8*  --   --   --   --   TROPONINI  --  1.11* 0.96*  --  0.56*  --     Estimated Creatinine Clearance: 99.2 ml/min (by C-G formula based on Cr of 0.95).   Medications:  Heparin @ 1200 units/hr  Assessment: 43 YOM who presented to the [facility] on 9/12 with c/o CP and bump in troponins >> NSTEMI. Pharmacy was consulted to start heparin for anticoagulation while awaiting further cardiology work-up.   The patient's heparin level this morning was therapeutic after a rate increase earlier (HL 0.45, goal of 0.3-0.7). No CBC today - wnl on admission. No overt s/sx of bleeding noted.  Goal of Therapy:  Heparin level 0.3-0.7 units/ml Monitor platelets by anticoagulation protocol: Yes   Plan:  1. Continue heparin at 1200 units/hr (12 ml/hr) 2. Will continue to monitor for any signs/symptoms of bleeding and will follow up with heparin level in 6 hours to confirm therapeutic  Georgina Pillion, PharmD, BCPS Clinical Pharmacist Pager: [phone] 10/19/2013 1:21 PM     ------------------------------------------------------------------------------------------- Addendum:  Repeat HL this afternoon remains therapeutic though trending down (HL 0.3 << 0.45, goal of 0.3-0.7). Will increase the heparin drip rate slightly to keep within range and will f/u with a HL in the AM.  Plan 1. Increase heparin drip rate slightly to 1250 units/hr (12.5 ml/hr) 2. Will continue to monitor for any signs/symptoms of bleeding and will follow up with heparin level in the a.m.   Georgina Pillion, PharmD, BCPS Clinical Pharmacist Pager: [phone] 10/19/2013 4:02 PM

## 2013-10-19 NOTE — Progress Notes (Signed)
SUBJECTIVE:  No further CP  OBJECTIVE:   Vitals:   Filed Vitals:   10/18/13 1517 10/18/13 2038 10/19/13 0300 10/19/13 0500  BP: 152/92 139/70 131/84   Pulse:  71 69   Temp: 98.3 F (36.8 C) 98.5 F (36.9 C) 97.8 F (36.6 C)   TempSrc: Oral Oral Oral   Resp: 16 18 20   Height: 5' 9" (1.753 m)     Weight: 159 lb 2.8 oz (72.2 kg)   158 lb 9.6 oz (71.94 kg)  SpO2: 100% 100% 100%    I&O's:   Intake/Output Summary (Last 24 hours) at 10/19/13 0757 Last data filed at 10/18/13 2200  Gross per 24 hour  Intake    480 ml  Output    750 ml  Net   -270 ml   TELEMETRY: Reviewed telemetry pt in NSR:     PHYSICAL EXAM General: Well developed, well nourished, in no acute distress Head: Eyes PERRLA, No xanthomas.   Normal cephalic and atramatic  Lungs:   Clear bilaterally to auscultation and percussion. Heart:   HRRR S1 S2 Pulses are 2+ & equal. Abdomen: Bowel sounds are positive, abdomen soft and non-tender without masses Extremities:   No clubbing, cyanosis or edema.  DP +1 Neuro: Alert and oriented X 3. Psych:  Good affect, responds appropriately   LABS: Basic Metabolic Panel:  Recent Labs  10/18/13 1136 10/18/13 1919 10/19/13 0250  NA 139  --  136*  K 4.4  --  4.2  CL 103  --  101  CO2 24  --  23  GLUCOSE 78  --  101*  BUN 9  --  19  CREATININE 0.81  --  0.95  CALCIUM 9.5  --  8.8  MG  --  2.4  --    Liver Function Tests:  Recent Labs  10/18/13 1348  AST 38*  ALT 22  ALKPHOS 51  BILITOT 0.3  PROT 7.9  ALBUMIN 4.0   No results found for this basename: LIPASE, AMYLASE,  in the last 72 hours CBC:  Recent Labs  10/18/13 1136  WBC 4.3  HGB 15.1  HCT 43.4  MCV 81.9  PLT 263   Cardiac Enzymes:  Recent Labs  10/18/13 1348 10/18/13 1919 10/19/13 0250  CKTOTAL 635*  --   --   CKMB 7.8*  --   --   TROPONINI 1.11* 0.96* 0.56*   BNP: No components found with this basename: POCBNP,  D-Dimer: No results found for this basename: DDIMER,  in the  last 72 hours Hemoglobin A1C: No results found for this basename: HGBA1C,  in the last 72 hours Fasting Lipid Panel:  Recent Labs  10/18/13 1348  CHOL 283*  HDL 78  LDLCALC 182*  TRIG 117  CHOLHDL 3.6   Thyroid Function Tests:  Recent Labs  10/18/13 1348  TSH 0.276*   Anemia Panel: No results found for this basename: VITAMINB12, FOLATE, FERRITIN, TIBC, IRON, RETICCTPCT,  in the last 72 hours Coag Panel:   Lab Results  Component Value Date   INR 0.94 10/18/2013    RADIOLOGY: Dg Chest 2 View  10/18/2013   CLINICAL DATA:  Chest pain and hypertension for 2 days  EXAM: CHEST  2 VIEW  COMPARISON:  None.  FINDINGS: The heart size and mediastinal contours are within normal limits. Both lungs are clear. The visualized skeletal structures are unremarkable.  IMPRESSION: No active cardiopulmonary disease.   Electronically Signed   By: Raymond  Rubner M.D.     On: 10/18/2013 13:20   ASSESSMENT AND PLAN:  Principal Problem:  Non-ST elevation myocardial infarction (NSTEMI), initial episode of care - no further CP.  Continue IV Nitro, heparin, ASA, BB, statin. Cath Monday. The risks and benefits of a cardiac catheterization including, but not limited to, death, stroke, MI, kidney damage and bleeding were discussed with the patient and his wife, who indicate understanding and agree to proceed.  Active Problems:  Hypertension - well controlled - continue BB and follow  Hyperlipidemia LDL goal <70 - changed to high-dose statin  Tobacco use/ETOH use - cessation encouraged, follow, watch for withdrawal. Low TSH consistent with possible hyperthyroidism - check thryoid panel   Quintella Reichert, MD  10/19/2013  7:57 AM

## 2013-10-19 NOTE — Progress Notes (Signed)
Utilization review completed.  

## 2013-10-19 NOTE — H&P (Signed)
Patient seen, interviewed and personally examined and agree with note as outlined by Theodore Demark, PA-C.  Admitted with chest pain and initial cardiac markers are elevated consistent with NSTEMI.  He is pain free at this time and will be admitted for IV Heparin and NTG gtt, ASA, statin and BB.  Will plan left heart cath on Monday.  Will set up for echo to assess LVF>

## 2013-10-19 NOTE — Progress Notes (Signed)
ANTICOAGULATION CONSULT NOTE - Follow Up Consult  Pharmacy Consult for heparin Indication: NSTEMI  Labs:  Recent Labs  10/18/13 1136 10/18/13 1348 10/18/13 1919 10/18/13 2332  HGB 15.1  --   --   --   HCT 43.4  --   --   --   PLT 263  --   --   --   LABPROT  --   --  12.6  --   INR  --   --  0.94  --   HEPARINUNFRC  --   --   --  <0.10*  CREATININE 0.81  --   --   --   CKTOTAL  --  635*  --   --   CKMB  --  7.8*  --   --   TROPONINI  --  1.11* 0.96*  --     Assessment: 43yo male undetectable on heparin with initial dosing for NSTEMI.  Goal of Therapy:  Heparin level 0.3-0.7 units/ml   Plan:  Will rebolus with heparin 2000 units and increase gtt by 4 units/kg/hr to 1200 units/hr and check level in 6hr.  Vernard Gambles, PharmD, BCPS  10/19/2013,12:34 AM

## 2013-10-20 ENCOUNTER — Encounter (HOSPITAL_COMMUNITY): Payer: Self-pay | Admitting: Interventional Cardiology

## 2013-10-20 ENCOUNTER — Encounter (HOSPITAL_COMMUNITY)
Admission: EM | Disposition: A | Discharge status: Home / Self Care | Payer: Self-pay | Source: Home / Self Care | Attending: Cardiology

## 2013-10-20 HISTORY — PX: LEFT HEART CATHETERIZATION WITH CORONARY ANGIOGRAM: SHX5451

## 2013-10-20 LAB — CBC
HEMATOCRIT: 41.8 % (ref 39.0–52.0)
HEMOGLOBIN: 14.5 g/dL (ref 13.0–17.0)
MCH: 28.5 pg (ref 26.0–34.0)
MCHC: 34.7 g/dL (ref 30.0–36.0)
MCV: 82.1 fL (ref 78.0–100.0)
Platelets: 266 10*3/uL (ref 150–400)
RBC: 5.09 MIL/uL (ref 4.22–5.81)
RDW: 14.6 % (ref 11.5–15.5)
WBC: 5.1 10*3/uL (ref 4.0–10.5)

## 2013-10-20 LAB — HEPARIN LEVEL (UNFRACTIONATED): Heparin Unfractionated: 0.36 IU/mL (ref 0.30–0.70)

## 2013-10-20 LAB — T3, FREE: T3 FREE: 3.1 pg/mL (ref 2.3–4.2)

## 2013-10-20 LAB — T4, FREE: Free T4: 1.4 ng/dL (ref 0.80–1.80)

## 2013-10-20 SURGERY — LEFT HEART CATHETERIZATION WITH CORONARY ANGIOGRAM
Anesthesia: LOCAL

## 2013-10-20 MED ORDER — ASPIRIN 81 MG PO CHEW: 81.0000 mg | CHEWABLE_TABLET | Freq: Every day | ORAL | Status: DC

## 2013-10-20 MED ORDER — ASPIRIN EC 81 MG PO TBEC: 81.0000 mg | DELAYED_RELEASE_TABLET | Freq: Every day | ORAL | Status: DC

## 2013-10-20 MED ORDER — MIDAZOLAM HCL 2 MG/2ML IJ SOLN
INTRAMUSCULAR | Status: AC
  Filled 2013-10-20: qty 2

## 2013-10-20 MED ORDER — ONDANSETRON HCL 4 MG/2ML IJ SOLN: 4.0000 mg | Freq: Four times a day (QID) | INTRAMUSCULAR | Status: DC | PRN

## 2013-10-20 MED ORDER — HEPARIN SODIUM (PORCINE) 1000 UNIT/ML IJ SOLN
INTRAMUSCULAR | Status: AC
  Filled 2013-10-20: qty 1

## 2013-10-20 MED ORDER — HEPARIN (PORCINE) IN NACL 2-0.9 UNIT/ML-% IJ SOLN
INTRAMUSCULAR | Status: AC
  Filled 2013-10-20: qty 500

## 2013-10-20 MED ORDER — FENTANYL CITRATE 0.05 MG/ML IJ SOLN
INTRAMUSCULAR | Status: AC
  Filled 2013-10-20: qty 2

## 2013-10-20 MED ORDER — SODIUM CHLORIDE 0.9 % IV SOLN
1.0000 mL/kg/h | INTRAVENOUS | Status: AC
  Administered 2013-10-20: 10:00:00 1 mL/kg/h via INTRAVENOUS

## 2013-10-20 MED ORDER — TIROFIBAN HCL IV 5 MG/100ML
INTRAVENOUS | Status: AC
  Filled 2013-10-20: qty 100

## 2013-10-20 MED ORDER — LIDOCAINE HCL (PF) 1 % IJ SOLN
INTRAMUSCULAR | Status: AC
  Filled 2013-10-20: qty 30

## 2013-10-20 MED ORDER — VERAPAMIL HCL 2.5 MG/ML IV SOLN
INTRAVENOUS | Status: AC
  Filled 2013-10-20: qty 2

## 2013-10-20 MED ORDER — TICAGRELOR 90 MG PO TABS
90.0000 mg | ORAL_TABLET | Freq: Two times a day (BID) | ORAL | Status: DC
Start: 2013-10-20 — End: 2013-10-21
  Administered 2013-10-20 – 2013-10-21 (×2): 90 mg via ORAL
  Filled 2013-10-20 (×4): qty 1

## 2013-10-20 MED ORDER — TICAGRELOR 90 MG PO TABS
ORAL_TABLET | ORAL | Status: AC
  Administered 2013-10-20: 22:00:00 90 mg via ORAL
  Filled 2013-10-20: qty 1

## 2013-10-20 MED ORDER — NITROGLYCERIN 1 MG/10 ML FOR IR/CATH LAB
INTRA_ARTERIAL | Status: AC
  Filled 2013-10-20: qty 10

## 2013-10-20 MED ORDER — ADENOSINE 12 MG/4ML IV SOLN
12.0000 mL | Freq: Once | INTRAVENOUS | Status: DC
  Administered 2013-10-20: 36 mg via INTRAVENOUS
  Filled 2013-10-20: qty 12

## 2013-10-20 MED ORDER — ACETAMINOPHEN 325 MG PO TABS: 650.0000 mg | ORAL_TABLET | ORAL | Status: DC | PRN

## 2013-10-20 NOTE — Progress Notes (Signed)
ANTICOAGULATION CONSULT NOTE - Follow Up Consult  Pharmacy Consult:  Heparin Indication: chest pain/ACS  No Known Allergies  Patient Measurements: Height:  (175.3 cm) Weight: 159 lb 4.8 oz (72.258 kg) IBW/kg (Calculated) : 70.7 Heparin Dosing Weight: 72 kg  Vital Signs: Temp: 98.3 F (36.8 C) (09/14 0500) Temp src: Oral (09/14 0500) BP: 134/74 mmHg (09/14 0500) Pulse Rate: 68 (09/14 0500)  Labs:  Recent Labs  10/18/13 1136 10/18/13 1348 10/18/13 1919  10/19/13 0250 10/19/13 0749 10/19/13 1407 10/19/13 1736 10/20/13 0605  HGB 15.1  --   --   --   --   --   --  13.7 14.5  HCT 43.4  --   --   --   --   --   --  40.3 41.8  PLT 263  --   --   --   --   --   --  246 266  LABPROT  --   --  12.6  --   --   --   --  12.0  --   INR  --   --  0.94  --   --   --   --  0.89  --   HEPARINUNFRC  --   --   --   < >  --  0.45 0.30  --  0.36  CREATININE 0.81  --   --   --  0.95  --   --  0.89  --   CKTOTAL  --  635*  --   --   --   --   --   --   --   CKMB  --  7.8*  --   --   --   --   --   --   --   TROPONINI  --  1.11* 0.96*  --  0.56*  --   --   --   --   < > = values in this interval not displayed.  Estimated Creatinine Clearance: 105.9 ml/min (by C-G formula based on Cr of 0.89).     Assessment: 36 YOM who presented to the [facility] on [facility]18[facility]13 with complaint of chest pain, found to have NSTEMI.  Pharmacy was consulted to managing heparin for anticoagulation while awaiting cath.  Heparin level therapeutic; no bleeding reported.   Goal of Therapy:  Heparin level 0.3-0.7 units/ml Monitor platelets by anticoagulation protocol: Yes    Plan:  - Continue heparin gtt at 1250 units/hr - Daily HL / CBC - F/U post cath     D. Laney Potash, PharmD, BCPS Pager:  [phone] 10/20/2013, 7:49 AM

## 2013-10-20 NOTE — Interval H&P Note (Signed)
Cath Lab Visit (complete for each Cath Lab visit)  Clinical Evaluation Leading to the Procedure:   ACS: Yes.    Non-ACS:    Anginal Classification: CCS IV  Anti-ischemic medical therapy: Minimal Therapy (1 class of medications)  Non-Invasive Test Results: No non-invasive testing performed  Prior CABG: No previous CABG      History and Physical Interval Note:  10/20/2013 7:46 AM  Joseph Carr  has presented today for surgery, with the diagnosis of NSTEMI  The various methods of treatment have been discussed with the patient and family. After consideration of risks, benefits and other options for treatment, the patient has consented to  Procedure(s): LEFT HEART CATHETERIZATION WITH CORONARY ANGIOGRAM (N/A) as a surgical intervention .  The patient's history has been reviewed, patient examined, no change in status, stable for surgery.  I have reviewed the patient's chart and labs.  Questions were answered to the patient's satisfaction.     , S.

## 2013-10-20 NOTE — Progress Notes (Signed)
TR BAND REMOVAL  LOCATION:  right radial  DEFLATED PER PROTOCOL:  Yes.    TIME BAND OFF / DRESSING APPLIED:   1430   SITE UPON ARRIVAL:   Level 0  SITE AFTER BAND REMOVAL:  Level 0  REVERSE ALLEN'S TEST:    positive  CIRCULATION SENSATION AND MOVEMENT:  Within Normal Limits  Yes.    COMMENTS:    

## 2013-10-20 NOTE — Progress Notes (Signed)
On for catheterization. Donato Schultz, MD

## 2013-10-20 NOTE — Progress Notes (Signed)
Nutrition Brief Note  Patient identified on the Malnutrition Screening Tool (MST) Report  Wt Readings from Last 15 Encounters:  10/20/13 159 lb 4.8 oz (72.258 kg)  10/20/13 159 lb 4.8 oz (72.258 kg)  02/11/11 177 lb (80.287 kg)    Body mass index is 23.51 kg/(m^2). Patient meets criteria for normal body weight based on current BMI.   Current diet order is heart healthy, patient is consuming approximately 100% of meals at this time. Labs and medications reviewed.   Pt reports that he has not lost any weight recently. He says that his appetite is good.   No nutrition interventions warranted at this time. If nutrition issues arise, please consult RD.   Ebbie Latus RD, LDN

## 2013-10-20 NOTE — CV Procedure (Signed)
PROCEDURE:  Left heart catheterization with selective coronary angiography, left ventriculogram. Right upper extremity angiogram. FFR of the ramus intermedius. PCI of the ramus intermedius.  INDICATIONS:  NSTEMI  The risks, benefits, and details of the procedure were explained to the patient.  The patient verbalized understanding and wanted to proceed.  Informed written consent was obtained.  PROCEDURE TECHNIQUE:  After Xylocaine anesthesia a 24F slender sheath was placed in the right radial artery with a single anterior needle wall stick.  IV heparin was given. Right upper extremity angiogram was performed through an injection of contrast using the JR 4 catheter in the axillary artery because the guidewire continue to select the carotid circulation. Right coronary angiography was done using a Judkins R4 guide catheter.  Left coronary angiography was done using a Judkins L3.5 guide catheter.  Left ventriculography was done using a pigtail catheter.  A TR band was used for hemostasis.   CONTRAST:  Total of 130 cc.  COMPLICATIONS:  None.    HEMODYNAMICS:  Aortic pressure was 112/73; LV pressure was 116/5; LVEDP 10.  There was no gradient between the left ventricle and aorta.    ANGIOGRAPHIC DATA:   The left main coronary artery is a short vessel but patent.  The left anterior descending artery is a large vessel which wraps around the apex. There is mild disease in the mid vessel. There several small diagonals which are patent.  The left circumflex artery is medium size vessel which is patent. There is mild diffuse disease. There is a large proximal 60-70% stenosis. This is worse in some views.  The right coronary artery is  is medium size dominant vessel. There is minimal proximal atherosclerosis. The posterior descending artery is large. The posterior lateral artery a small. There is no significant obstructive disease in the RCA system.  Right upper extremity angiogram: The wire  selective the carotid vessel on the right side. Multiple attempts were made to redirect the wire into the proximal circulation.  Through the JR 4 catheter, hand injection of contrast was performed. This revealed no significant obstruction in the right axillary artery.  LEFT VENTRICULOGRAM:  Left ventricular angiogram was done in the 30 RAO projection and revealed normal left ventricular wall motion and systolic function with an estimated ejection fraction of 55%.  LVEDP was 10 mmHg.  PCI NARRATIVE:  A CLS 3.0 guide catheter was used to engage the left main. An additional bolus of heparin was given. ACT was used to check that the anticoagulation was therapeutic. A pressure wire was placed across the area disease in the ramus. Due to technical reasons, a second pressure wire to be used. This wire was normalized with center at the tip of the guide. A wire was placed across the lesion. Resting FFR was shown 0.93. After IV adenosine was used for maximal hyperemia, FFR decreased to 0.74. We decided to proceed with intervention.  A 2.0 x 15 balloon was used to predilate the area disease in the proximal ramus. A 2.5 x 20 mm promus drug-eluting stent was then deployed.  The stent was post dilated with a 2.75 x 15 noncompliant balloon. Intracoronary nitroglycerin was given. There was an excellent angiographic result. There is no residual stenosis.   IMPRESSIONS:  1. Normal left main coronary artery. 2.  Widely patent  left anterior descending artery and its branches. 3.  Patent  left circumflex artery. Significant stenosis in the proximal large ramus intermedius branch. FFR was 0.74. This was  successfully treated with a 2.5 x 20 promus drug-eluting stent, postdilated to 2.75 mm in diameter.  4. Patent right coronary artery. 5. Normal left ventricular systolic function.  LVEDP  10  mmHg.  Ejection fraction 55%.  RECOMMENDATION:  Continue medical therapy.  Continue dual antiplatelet therapy for at least a year.  We watched overnight. Consider discharge tomorrow if there are no complications.  We stressed the importance of smoking cessation as well as compliance with his dual antiplatelet therapy.

## 2013-10-20 NOTE — H&P (View-Only) (Signed)
SUBJECTIVE:  No further CP  OBJECTIVE:   Vitals:   Filed Vitals:   10/18/13 1517 10/18/13 2038 10/19/13 0300 10/19/13 0500  BP: 152/92 139/70 131/84   Pulse:  71 69   Temp: 98.3 F (36.8 C) 98.5 F (36.9 C) 97.8 F (36.6 C)   TempSrc: Oral Oral Oral   Resp: Height:  (1.753 m)     Weight: 159 lb 2.8 oz (72.2 kg)   158 lb 9.6 oz (71.94 kg)  SpO2: 100% 100% 100%    I&O's:   Intake/Output Summary (Last 24 hours) at 10/19/13 0757 Last data filed at 10/18/13 2200  Gross per 24 hour  Intake    480 ml  Output    750 ml  Net   -270 ml   TELEMETRY: Reviewed telemetry pt in NSR:     PHYSICAL EXAM General: Well developed, well nourished, in no acute distress Head: Eyes PERRLA, No xanthomas.   Normal cephalic and atramatic  Lungs:   Clear bilaterally to auscultation and percussion. Heart:   HRRR S1 S2 Pulses are 2+ & equal. Abdomen: Bowel sounds are positive, abdomen soft and non-tender without masses Extremities:   No clubbing, cyanosis or edema.  DP +1 Neuro: Alert and oriented X 3. Psych:  Good affect, responds appropriately   LABS: Basic Metabolic Panel:  Recent Labs  16/10/96 1136 10/18/13 1919 10/19/13 0250  NA 139  --  136*  K 4.4  --  4.2  CL 103  --  101  CO2 24  --  23  GLUCOSE 78  --  101*  BUN 9  --  19  CREATININE 0.81  --  0.95  CALCIUM 9.5  --  8.8  MG  --  2.4  --    Liver Function Tests:  Recent Labs  10/18/13 1348  AST 38*  ALT 22  ALKPHOS 51  BILITOT 0.3  PROT 7.9  ALBUMIN 4.0   No results found for this basename: LIPASE, AMYLASE,  in the last 72 hours CBC:  Recent Labs  10/18/13 1136  WBC 4.3  HGB 15.1  HCT 43.4  MCV 81.9  PLT 263   Cardiac Enzymes:  Recent Labs  10/18/13 1348 10/18/13 1919 10/19/13 0250  CKTOTAL 635*  --   --   CKMB 7.8*  --   --   TROPONINI 1.11* 0.96* 0.56*   BNP: No components found with this basename: POCBNP,  D-Dimer: No results found for this basename: DDIMER,  in the  last 72 hours Hemoglobin A1C: No results found for this basename: HGBA1C,  in the last 72 hours Fasting Lipid Panel:  Recent Labs  10/18/13 1348  CHOL 283*  HDL 78  LDLCALC 182*  TRIG 117  CHOLHDL 3.6   Thyroid Function Tests:  Recent Labs  10/18/13 1348  TSH 0.276*   Anemia Panel: No results found for this basename: VITAMINB12, FOLATE, FERRITIN, TIBC, IRON, RETICCTPCT,  in the last 72 hours Coag Panel:   Lab Results  Component Value Date   INR 0.94 10/18/2013    RADIOLOGY: Dg Chest 2 View  10/18/2013   CLINICAL DATA:  Chest pain and hypertension for 2 days  EXAM: CHEST  2 VIEW  COMPARISON:  None.  FINDINGS: The heart size and mediastinal contours are within normal limits. Both lungs are clear. The visualized skeletal structures are unremarkable.  IMPRESSION: No active cardiopulmonary disease.   Electronically Signed   By: Edgar Frisk.D.  On: 10/18/2013 13:20   ASSESSMENT AND PLAN:  Principal Problem:  Non-ST elevation myocardial infarction (NSTEMI), initial episode of care - no further CP.  Continue IV Nitro, heparin, ASA, BB, statin. Cath Monday. The risks and benefits of a cardiac catheterization including, but not limited to, death, stroke, MI, kidney damage and bleeding were discussed with the patient and his wife, who indicate understanding and agree to proceed.  Active Problems:  Hypertension - well controlled - continue BB and follow  Hyperlipidemia LDL goal <70 - changed to high-dose statin  Tobacco use/ETOH use - cessation encouraged, follow, watch for withdrawal. Low TSH consistent with possible hyperthyroidism - check thryoid panel   Joseph Reichert, MD  10/19/2013  7:57 AM

## 2013-10-21 DIAGNOSIS — I2510 Atherosclerotic heart disease of native coronary artery without angina pectoris: Secondary | ICD-10-CM

## 2013-10-21 DIAGNOSIS — Z9861 Coronary angioplasty status: Secondary | ICD-10-CM

## 2013-10-21 DIAGNOSIS — Z955 Presence of coronary angioplasty implant and graft: Secondary | ICD-10-CM

## 2013-10-21 DIAGNOSIS — I2129 ST elevation (STEMI) myocardial infarction involving other sites: Secondary | ICD-10-CM

## 2013-10-21 LAB — CBC
HCT: 38.4 % — ABNORMAL LOW (ref 39.0–52.0)
HEMOGLOBIN: 12.9 g/dL — AB (ref 13.0–17.0)
MCH: 28 pg (ref 26.0–34.0)
MCHC: 33.6 g/dL (ref 30.0–36.0)
MCV: 83.3 fL (ref 78.0–100.0)
PLATELETS: 219 10*3/uL (ref 150–400)
RBC: 4.61 MIL/uL (ref 4.22–5.81)
RDW: 14.8 % (ref 11.5–15.5)
WBC: 7.4 10*3/uL (ref 4.0–10.5)

## 2013-10-21 LAB — BASIC METABOLIC PANEL
Anion gap: 9 (ref 5–15)
BUN: 12 mg/dL (ref 6–23)
CHLORIDE: 104 meq/L (ref 96–112)
CO2: 26 mEq/L (ref 19–32)
Calcium: 9 mg/dL (ref 8.4–10.5)
Creatinine, Ser: 0.93 mg/dL (ref 0.50–1.35)
GFR calc non Af Amer: >90 mL/min (ref >90)
Glucose, Bld: 97 mg/dL (ref 70–99)
POTASSIUM: 4.6 meq/L (ref 3.7–5.3)
Sodium: 139 mEq/L (ref 137–147)

## 2013-10-21 LAB — POCT ACTIVATED CLOTTING TIME: Activated Clotting Time: 264 seconds

## 2013-10-21 MED ORDER — HYDROCHLOROTHIAZIDE 25 MG PO TABS
25.0000 mg | ORAL_TABLET | Freq: Every day | ORAL | Status: DC
  Administered 2013-10-21: 09:00:00 25 mg via ORAL
  Filled 2013-10-21 (×2): qty 1

## 2013-10-21 MED ORDER — TICAGRELOR 90 MG PO TABS
90.0000 mg | ORAL_TABLET | Freq: Two times a day (BID) | ORAL | Status: DC
Start: 2013-10-21 — End: 2013-10-21

## 2013-10-21 MED ORDER — ATORVASTATIN CALCIUM 80 MG PO TABS: 80.0000 mg | ORAL_TABLET | Freq: Every day | ORAL | Status: DC

## 2013-10-21 MED ORDER — ACETAMINOPHEN 325 MG PO TABS: 650.0000 mg | ORAL_TABLET | ORAL | Status: DC | PRN

## 2013-10-21 MED ORDER — TICAGRELOR 90 MG PO TABS
90.0000 mg | ORAL_TABLET | Freq: Two times a day (BID) | ORAL | Status: DC
Start: 2013-10-21 — End: 2014-07-07

## 2013-10-21 MED ORDER — METOPROLOL TARTRATE 25 MG PO TABS: 25.0000 mg | ORAL_TABLET | Freq: Two times a day (BID) | ORAL | Status: DC

## 2013-10-21 MED ORDER — HEART ATTACK BOUNCING BOOK
Freq: Once | Status: AC
Start: 2013-10-21 — End: 2013-10-21
  Administered 2013-10-21: 02:00:00
  Filled 2013-10-21: qty 1

## 2013-10-21 MED ORDER — METOPROLOL TARTRATE 25 MG PO TABS
25.0000 mg | ORAL_TABLET | Freq: Two times a day (BID) | ORAL | Status: DC
  Administered 2013-10-21: 10:00:00 25 mg via ORAL

## 2013-10-21 MED ORDER — LISINOPRIL 20 MG PO TABS
20.0000 mg | ORAL_TABLET | Freq: Every day | ORAL | Status: DC
Start: 2013-10-21 — End: 2013-10-21
  Administered 2013-10-21: 20 mg via ORAL
  Filled 2013-10-21 (×2): qty 1

## 2013-10-21 MED ORDER — NITROGLYCERIN 0.4 MG SL SUBL: 0.4000 mg | SUBLINGUAL_TABLET | SUBLINGUAL | Status: DC | PRN

## 2013-10-21 NOTE — Discharge Instructions (Signed)
Call [facility] at [phone] if any bleeding, swelling or drainage at cath site.  May shower, no tub baths for 48 hours for groin sticks. No lifting over 5 pounds for 2 weeks, no driving for 1 week.  Heart Healthy diet.  Do not stop Brilinta, stopping could cause a heart attack.  You should have 30 day free card for Brilinta.  No work until seen in the office.  Take 1 NTG, under your tongue, while sitting.  If no relief of pain may repeat NTG, one tab every 5 minutes up to 3 tablets total over 15 minutes.  If no relief CALL 911.  If you have dizziness/lightheadness  while taking NTG, stop taking and call 911.

## 2013-10-21 NOTE — Care Management Note (Signed)
    Page 1 of 1   10/21/2013     11:24:03 AM CARE MANAGEMENT NOTE 10/21/2013  Patient:  Joseph Carr, Joseph Carr   Account Number:  [id number]  Date Initiated:  10/21/2013  Documentation initiated by:  Oletta Cohn  Subjective/Objective Assessment:   43 yo male Admitted with chest pain and initial cardiac markers are elevated consistent with NSTEMI.//Home with spouse.     Action/Plan:   IV Nitro, heparin, ASA, BB, statin. Cath 9/14. //Benefits check for Brilinta   Anticipated DC Date:  10/21/2013   Anticipated DC Plan:  HOME/SELF CARE      DC Planning Services  Medication Assistance  CM consult      Choice offered to / List presented to:             Status of service:  Completed, signed off Medicare Important Message given?   (If response is "NO", the following Medicare IM given date fields will be blank) Date Medicare IM given:   Medicare IM given by:   Date Additional Medicare IM given:   Additional Medicare IM given by:    Discharge Disposition:  HOME/SELF CARE  Per UR Regulation:  Reviewed for med. necessity/level of care/duration of stay  If discussed at Long Length of Stay Meetings, dates discussed:    Comments:  10/21/12 1030  J. Lucretia Roers, RN, BSN, [employer] [phone] Spoke with pt at bedside regarding benefits check for Brilinta.  Pt has brochure with 30 day free card and refill assistance card intact.  Pt utilizes [facility] on [address] for prescription needs.  NCM called pharmacy to confirm availability of medication.  Information relayed to pt.  Pt verbalizes importance of filling medication upon discharge.  Benefits Check:  per [employer] online: brilinta is on drug list/ covered/ tier 3/ $100 co-pay at retail

## 2013-10-21 NOTE — Plan of Care (Signed)
Problem: Consults Goal: Tobacco Cessation referral if indicated Outcome: Completed/Met Date Met:  10/21/13 Smoking cessation discussed with pt, not committed to quit at this time.  Tips for success and support hotline # given.  Encouraged pt to attempt to quit since he has been smoke free for at least 3 days now.

## 2013-10-21 NOTE — Discharge Summary (Signed)
Patient was seen and examined this morning. Please see progress note for details. He was ready for discharge after ambulation. Agree with the DC summary.  Marykay Lex, M.D., M.S. Interventional Cardiologist   Pager # [phone]

## 2013-10-21 NOTE — Discharge Summary (Signed)
Physician Discharge Summary       Patient ID: Joseph Carr MRN: [id number] DOB/AGE: 44/28/1972 44 y.o.  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 Mayford Knife  Procedures: cardiac cath 10/20/13 by Dr. Eldridge Dace PCI- Significant stenosis in the proximal large ramus intermedius branch. FFR was 0.74. This was successfully treated with a 2.5 x 20 promus drug-eluting stent, postdilated to 2.75 mm in diameter.  10/20/13 by Dr. Alanda Slim Course:  Joseph Carr is a 43 y.o. male with no history of CAD. He had been having chest pain with exertion 1-2 x week for months. He was admitted a few months ago and evaluated at [facility] with a stress test, records pending. He continued to have episodes of chest pain regularly. 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 He would stop and the episodes would resolve, without medical intervention. He would feel tired afterwards. Sometimes the episodes were associated with SOB and diaphoresis, but not always.  10/17/13, he had the pain as usual. He rested and it went away. However, it came back strongly, a 10/10, and was associated with SOB and diaphoresis, but no N&V. He took aspirin, and went home. He rested and the pain resolved in approximately 2-1/2 hours. He was feeling bad again 10/18/13, and came to the ER. He was having some mild discomfort, but does  not appear uncomfortable at rest. He has never had pain this bad or that lasted this long before.    He was admitted on IV Nitro, heparin, ASA BB, statin.  His troponin peaked at 1.11 and was declining at discharge.  He underwent cath 10/20/13  1. Normal left main coronary artery. 2. Widely patent left anterior descending artery and its branches. 3. Patent left circumflex artery. Significant stenosis in the proximal large ramus intermedius branch. FFR was 0.74. This was successfully treated with a 2.5 x 20 promus drug-eluting stent, postdilated to 2.75 mm in diameter.  4. Patent right coronary artery. 5. Normal left ventricular systolic function. LVEDP 10 mmHg. Ejection fraction 55%.  He was doing well the AM of discharge, his NTG was weaned and d/c'd.  He ambulated with cardiac rehab and had no complaints.  He is on statin now as well.  He will be out of work until seen in the office. Seen and evaluated by Dr. Herbie Baltimore and found stable for discharge.   Consults: None  Significant Diagnostic Studies:  BMET    Component Value Date/Time   NA 139 10/21/2013 0340   K 4.6 10/21/2013 0340   CL 104 10/21/2013 0340   CO2 26 10/21/2013 0340   GLUCOSE 97 10/21/2013 0340   BUN 12 10/21/2013 0340   CREATININE 0.93 10/21/2013 0340   CALCIUM 9.0 10/21/2013 0340   GFRNONAA >90 10/21/2013 0340   GFRAA >90 10/21/2013 0340    CBC    Component Value Date/Time   WBC 7.4 10/21/2013 0340  RBC 4.61 10/21/2013 0340   HGB 12.9* 10/21/2013 0340   HCT 38.4* 10/21/2013 0340   PLT 219 10/21/2013 0340   MCV 83.3 10/21/2013 0340   MCH 28.0 10/21/2013 0340   MCHC 33.6 10/21/2013 0340   RDW 14.8 10/21/2013 0340   LYMPHSABS 2.0 02/11/2011 1345   MONOABS 0.5 02/11/2011 1345   EOSABS 0.4 02/11/2011 1345   BASOSABS 0.0 02/11/2011 1345    Troponin 1.11 pk  Lipid Panel     Component Value Date/Time   CHOL 283* 10/18/2013 1348   TRIG 117 10/18/2013 1348   HDL 78 10/18/2013 1348   CHOLHDL 3.6 10/18/2013 1348   VLDL 23 10/18/2013  1348   LDLCALC 182* 10/18/2013 1348    TSH 0.276 Free T4 1.40 Free T3 3.1 T3 uptake ratio  Cardiac Cath and PCI:  ANGIOGRAPHIC DATA: The left main coronary artery is a short vessel but patent.  The left anterior descending artery is a large vessel which wraps around the apex. There is mild disease in the mid vessel. There several small diagonals which are patent.  The left circumflex artery is medium size vessel which is patent. There is mild diffuse disease. There is a large proximal 60-70% stenosis. This is worse in some views.  The right coronary artery is is medium size dominant vessel. There is minimal proximal atherosclerosis. The posterior descending artery is large. The posterior lateral artery a small. There is no significant obstructive disease in the RCA system.  Right upper extremity angiogram: The wire selective the carotid vessel on the right side. Multiple attempts were made to redirect the wire into the proximal circulation. Through the JR 4 catheter, hand injection of contrast was performed. This revealed no significant obstruction in the right axillary artery.  LEFT VENTRICULOGRAM: Left ventricular angiogram was done in the 30 RAO projection and revealed normal left ventricular wall motion and systolic function with an estimated ejection fraction of 55%. LVEDP was 10 mmHg.  PCI NARRATIVE: A CLS 3.0 guide catheter was used to engage the left main. An additional bolus of heparin was given. ACT was used to check that the anticoagulation was therapeutic. A pressure wire was placed across the area disease in the ramus. Due to technical reasons, a second pressure wire to be used. This wire was normalized with center at the tip of the guide. A wire was placed across the lesion. Resting FFR was shown 0.93. After IV adenosine was used for maximal hyperemia, FFR decreased to 0.74. We decided to proceed with intervention.  A 2.0 x 15 balloon was used to predilate the area disease in the  proximal ramus. A 2.5 x 20 mm promus drug-eluting stent was then deployed. The stent was post dilated with a 2.75 x 15 noncompliant balloon. Intracoronary nitroglycerin was given. There was an excellent angiographic result. There is no residual stenosis.  IMPRESSIONS:  6. Normal left main coronary artery. 7. Widely patent left anterior descending artery and its branches. 8. Patent left circumflex artery. Significant stenosis in the proximal large ramus intermedius branch. FFR was 0.74. This was successfully treated with a 2.5 x 20 promus drug-eluting stent, postdilated to 2.75 mm in diameter.  9. Patent right coronary artery. 10. Normal left ventricular systolic function. LVEDP 10 mmHg. Ejection fraction 55%. RECOMMENDATION: Continue medical therapy. Continue dual antiplatelet therapy for at least a year. We watched overnight. Consider discharge tomorrow if there are no complications. We stressed the importance of smoking cessation as well as compliance with his dual antiplatelet  therapy.   CHEST 2 VIEW  COMPARISON: None.  FINDINGS:  The heart size and mediastinal contours are within normal limits.  Both lungs are clear. The visualized skeletal structures are  unremarkable.  IMPRESSION:  No active cardiopulmonary disease  EKG: Normal sinus rhythm rate 81 Moderate voltage criteria for LVH, may be normal variant Borderline ECG  Discharge Exam: Blood pressure 154/86, pulse 93, temperature 98 F (36.7 C), temperature source Oral, resp. rate 20, height  (1.753 m), weight 160 lb 7.9 oz (72.8 kg), SpO2 100.00%.    Disposition: 01-Home or Self Care      Discharge Instructions   Amb Referral to Cardiac Rehabilitation    Complete by:  As directed             Medication List         acetaminophen 325 MG tablet  Commonly known as:  TYLENOL  Take 2 tablets (650 mg total) by mouth every 4 (four) hours as needed for headache or mild pain.     aspirin EC 81 MG tablet  Take 81 mg by  mouth daily.     atorvastatin 80 MG tablet  Commonly known as:  LIPITOR  Take 1 tablet (80 mg total) by mouth daily at 6 PM.     lisinopril-hydrochlorothiazide 20-25 MG per tablet  Commonly known as:  PRINZIDE,ZESTORETIC  Take 1 tablet by mouth daily.     metoprolol tartrate 25 MG tablet  Commonly known as:  LOPRESSOR  Take 1 tablet (25 mg total) by mouth 2 (two) times daily.     nitroGLYCERIN 0.4 MG SL tablet  Commonly known as:  NITROSTAT  Place 1 tablet (0.4 mg total) under the tongue every 5 (five) minutes x 3 doses as needed for chest pain.     ticagrelor 90 MG Tabs tablet  Commonly known as:  BRILINTA  Take 1 tablet (90 mg total) by mouth 2 (two) times daily. This is for regular prescription.       Follow-up Information   Follow up with Quintella Reichert, MD On 10/30/2013. (at 10:45 with Ronie Spies, PA - Dr. Norris Cross PA)    Specialty:  Cardiology   Contact information:   [address] [place] [address] [phone]        Discharge Instructions: Call [facility] at [phone] if any bleeding, swelling or drainage at cath site.  May shower, no tub baths for 48 hours for groin sticks. No lifting over 5 pounds for 2 weeks, no driving for 1 week.  Heart Healthy diet.  Do not stop Brilinta, stopping could cause a heart attack.  You should have 30 day free card for Brilinta.  No work until seen in the office.  Take 1 NTG, under your tongue, while sitting.  If no relief of pain may repeat NTG, one tab every 5 minutes up to 3 tablets total over 15 minutes.  If no relief CALL 911.  If you have dizziness/lightheadness  while taking NTG, stop taking and call 911.           Signed: Leone Brand Nurse Practitioner-Certified [facility]: [facility] 10/21/2013, 9:20 AM  Time spent on discharge : >35 minutes.

## 2013-10-21 NOTE — Progress Notes (Signed)
Patient ID: Joseph Carr, male   DOB: December 11, 1970, 43 y.o.   MRN: [id number]. PCP:  Pcp Not In System Cardiologist: Turner  Admitted for NSTEMI: Principal Problem:   Acute non-Q wave ST elevation myocardial infarction (STEMI) involving left circumflex coronary artery Active Problems:   CAD S/P percutaneous coronary angioplasty:  PCI of Ramus Intermedius - Promus Premier DES 2.5 mm  20 mm (2.75 mm)    Presence of drug coated stent in Circumflex-Ramus Intermedius branch: Promus DES 2.5 mm x 20 mm (2.75 mm)   Essential hypertension   Hyperlipidemia LDL goal <70   Low serum thyroid stimulating hormone (TSH)  SUBJECTIVE:  Cardiac CATH - PCI RI with DES yesterday No further CP  OBJECTIVE:   Vitals:   Filed Vitals:   10/20/13 1830 10/20/13 2119 10/21/13 0023 10/21/13 0620  BP: 150/83 154/67 136/54 160/85  Pulse: 86 86 81 79  Temp:  98.1 F (36.7 C) 98.1 F (36.7 C) 98.1 F (36.7 C)  TempSrc:  Oral Oral Oral  Resp:  Height:      Weight:   160 lb 7.9 oz (72.8 kg)   SpO2: 97% 97% 100% 100%   I&O's:    Intake/Output Summary (Last 24 hours) at 10/21/13 0701 Last data filed at 10/21/13 7829  Gross per 24 hour  Intake 764.18 ml  Output   1375 ml  Net -610.82 ml   TELEMETRY: Reviewed telemetry pt in NSR:   PHYSICAL EXAM General: Well developed, well nourished, in no acute distress Head: Eyes PERRLA, No xanthomas.   Normal cephalic and atramatic  Lungs:   Clear bilaterally to auscultation and percussion. Heart:   HRRR S1 S2 Pulses are 2+ & equal. Abdomen: Bowel sounds are positive, abdomen soft and non-tender without masses Extremities:   No clubbing, cyanosis or edema.  DP +1; Radial Cath site C/D/I - good pulses Neuro: Alert and oriented X 3. Psych:  Good affect, responds appropriately   LABS: Basic Metabolic Panel:  Recent Labs  56/21/30 1136 10/18/13 1919  10/19/13 1736 10/21/13 0340  NA 139  --   < > 138 139  K 4.4  --   < > 4.1 4.6  CL 103   --   < > 102 104  CO2 24  --   < > 24 26  GLUCOSE 78  --   < > 141* 97  BUN 9  --   < > 14 12  CREATININE 0.81  --   < > 0.89 0.93  CALCIUM 9.5  --   < > 9.4 9.0  MG  --  2.4  --   --   --   < > = values in this interval not displayed. Liver Function Tests:  Recent Labs  10/18/13 1348  AST 38*  ALT 22  ALKPHOS 51  BILITOT 0.3  PROT 7.9  ALBUMIN 4.0   No results found for this basename: LIPASE, AMYLASE,  in the last 72 hours CBC:  Recent Labs  10/20/13 0605 10/21/13 0340  WBC 5.1 7.4  HGB 14.5 12.9*  HCT 41.8 38.4*  MCV 82.1 83.3  PLT 266 219   Cardiac Enzymes:  Recent Labs  10/18/13 1348 10/18/13 1919 10/19/13 0250  CKTOTAL 635*  --   --   CKMB 7.8*  --   --   TROPONINI 1.11* 0.96* 0.56*   BNP: No components found with this basename: POCBNP,  D-Dimer: No results found for this basename: DDIMER,  in the last 72 hours Hemoglobin A1C: No results found for this basename: HGBA1C,  in the last 72 hours Fasting Lipid Panel:  Recent Labs  10/18/13 1348  CHOL 283*  HDL 78  LDLCALC 182*  TRIG 117  CHOLHDL 3.6   Thyroid Function Tests:  Recent Labs  10/18/13 1348 10/19/13 1407  TSH 0.276*  --   T3FREE  --  3.1   Anemia Panel: No results found for this basename: VITAMINB12, FOLATE, FERRITIN, TIBC, IRON, RETICCTPCT,  in the last 72 hours Coag Panel:   Lab Results  Component Value Date   INR 0.89 10/19/2013   INR 0.94 10/18/2013    RADIOLOGY: Dg Chest 2 View  10/18/2013   CLINICAL DATA:  Chest pain and hypertension for 2 days  EXAM: CHEST  2 VIEW  COMPARISON:  None.  FINDINGS: The heart size and mediastinal contours are within normal limits. Both lungs are clear. The visualized skeletal structures are unremarkable.  IMPRESSION: No active cardiopulmonary disease.   Electronically Signed   By: Esperanza Heir M.D.   On: 10/18/2013 13:20   ASSESSMENT AND PLAN:  Principal Problem:   Acute non-Q wave ST elevation myocardial infarction (STEMI) involving  left circumflex coronary artery Active Problems:   CAD S/P percutaneous coronary angioplasty:  PCI of Ramus Intermedius - Promus Premier DES 2.5 mm  20 mm (2.75 mm)    Presence of drug coated stent in Circumflex-Ramus Intermedius branch: Promus DES 2.5 mm x 20 mm (2.75 mm)   Essential hypertension   Hyperlipidemia LDL goal <70   Low serum thyroid stimulating hormone (TSH)  NSTEMI: s/p PCI to RI with DES.  Non-ST elevation myocardial infarction (NSTEMI), initial episode of care - no further CP. PCI to   Continue ASA, BB, statin.  - increase BB to 25 mg bid  Active Problems:   Hypertension - well controlled - Elevated, Restart home ACE-I-HCTZ  Need to have NTG weaned off  Hyperlipidemia LDL goal <70 - changed to high-dose statin  Tobacco use/ETOH use - cessation encouraged, follow, watch for withdrawal. Low TSH consistent with possible hyperthyroidism - free T3 & T4 - normal  If able to ambulate today without Sx & BP is stable following AM meds - OK for d/c home.  F/u with Dr. Deretha Emory, MD  10/21/2013  7:01 AM

## 2013-10-21 NOTE — Progress Notes (Signed)
CARDIAC REHAB PHASE I   PRE:  Rate/Rhythm: 90 SR    BP: sitting 159/86    SaO2:   MODE:  Ambulation: 550 ft   POST:  Rate/Rhythm: 98 SR    BP: sitting 154/86     SaO2:   Tolerated well, denied CP, c/o. Ed completed. Pt fairly receptive. Sts he wants to smoke one cigarette then quit. Good motivation. Discussed risks of one cigarette. Interested in [facility] and will send referral to [place]. [phone]   Elissa Lovett Armstrong CES, ACSM 10/21/2013 9:12 AM

## 2013-10-22 NOTE — ED Provider Notes (Signed)
Medical screening examination/treatment/procedure(s) were performed by non-physician practitioner and as supervising physician I was immediately available for consultation/collaboration.   EKG Interpretation   Date/Time:  Saturday October 18 2013 10:46:25 EDT Ventricular Rate:  91 PR Interval:  168 QRS Duration: 88 QT Interval:  342 QTC Calculation: 420 R Axis:   80 Text Interpretation:  Normal sinus rhythm Normal ECG When compared with  ECG of 02/11/2011 No significant change was found Confirmed by [facility]  MD,   ([id number]) on 10/18/2013 11:34:08 AM        Samuel Jester, DO 10/22/13 1610

## 2013-10-29 ENCOUNTER — Telehealth: Payer: Self-pay | Admitting: Cardiology

## 2013-10-29 NOTE — Telephone Encounter (Signed)
Walk in pt Form" [employer]/Statement Of Claim" paper Dropped Off Sent to [facility] 9.23.15/km

## 2013-10-30 ENCOUNTER — Encounter: Payer: Self-pay | Admitting: Nurse Practitioner

## 2013-10-30 ENCOUNTER — Ambulatory Visit (INDEPENDENT_AMBULATORY_CARE_PROVIDER_SITE_OTHER): Payer: BC Managed Care – PPO | Admitting: Physician Assistant

## 2013-10-30 ENCOUNTER — Encounter: Payer: Self-pay | Admitting: Physician Assistant

## 2013-10-30 VITALS — BP 138/74 | HR 72 | Ht 69.0 in | Wt 160.8 lb

## 2013-10-30 DIAGNOSIS — E785 Hyperlipidemia, unspecified: Secondary | ICD-10-CM

## 2013-10-30 DIAGNOSIS — F17200 Nicotine dependence, unspecified, uncomplicated: Secondary | ICD-10-CM

## 2013-10-30 DIAGNOSIS — Z720 Tobacco use: Secondary | ICD-10-CM | POA: Insufficient documentation

## 2013-10-30 DIAGNOSIS — I10 Essential (primary) hypertension: Secondary | ICD-10-CM

## 2013-10-30 DIAGNOSIS — R7989 Other specified abnormal findings of blood chemistry: Secondary | ICD-10-CM

## 2013-10-30 DIAGNOSIS — I2510 Atherosclerotic heart disease of native coronary artery without angina pectoris: Secondary | ICD-10-CM

## 2013-10-30 DIAGNOSIS — Z9861 Coronary angioplasty status: Secondary | ICD-10-CM

## 2013-10-30 LAB — HEPATIC FUNCTION PANEL
ALK PHOS: 47 U/L (ref 39–117)
ALT: 30 U/L (ref 0–53)
AST: 29 U/L (ref 0–37)
Albumin: 4.5 g/dL (ref 3.5–5.2)
BILIRUBIN DIRECT: 0 mg/dL (ref 0.0–0.3)
BILIRUBIN TOTAL: 0.6 mg/dL (ref 0.2–1.2)
Total Protein: 8.3 g/dL (ref 6.0–8.3)

## 2013-10-30 LAB — BASIC METABOLIC PANEL
BUN: 16 mg/dL (ref 6–23)
CHLORIDE: 104 meq/L (ref 96–112)
CO2: 25 mEq/L (ref 19–32)
CREATININE: 1.1 mg/dL (ref 0.4–1.5)
Calcium: 9.9 mg/dL (ref 8.4–10.5)
GFR: 97.46 mL/min (ref >60.00)
Glucose, Bld: 88 mg/dL (ref 70–99)
Potassium: 3.8 mEq/L (ref 3.5–5.1)
SODIUM: 139 meq/L (ref 135–145)

## 2013-10-30 LAB — LIPID PANEL
Cholesterol: 157 mg/dL (ref 0–200)
HDL: 42.8 mg/dL (ref >39.00)
LDL Cholesterol: 97 mg/dL (ref 0–99)
NONHDL: 114.2
Total CHOL/HDL Ratio: 4
Triglycerides: 87 mg/dL (ref 0.0–149.0)
VLDL: 17.4 mg/dL (ref 0.0–40.0)

## 2013-10-30 LAB — T3, FREE: T3, Free: 3.5 pg/mL (ref 2.3–4.2)

## 2013-10-30 LAB — TSH: TSH: 0.32 u[IU]/mL — AB (ref 0.35–4.50)

## 2013-10-30 NOTE — Addendum Note (Signed)
Addended by: Tonita Phoenix on: 10/30/2013 11:55 AM   Modules accepted: Orders

## 2013-10-30 NOTE — Patient Instructions (Signed)
Your PA recommends that you continue on your current medications as directed. Please refer to the Current Medication list given to you today.  Your PA recommends that you have lab work in: TODAY - TSH, Free T4 to check your thyroid  Your physician recommends that you schedule a follow-up appointment in: 2 months with Dr. Mayford Knife  Your physician recommends that you return for lab work in: 6-8 weeks for BMET, fasting lipid panel, liver panel

## 2013-10-30 NOTE — Progress Notes (Signed)
[address] [place], [place]  [address] Phone: [phone] Fax:  [phone]  Date:  10/30/2013   Patient ID:  Joseph Carr, Joseph Carr May 12, 1970, MRN [id number]   PCP:  Pcp Not In System  Cardiologist:  Dr. Mayford Knife    History of Present Illness: Joseph Carr is a 43 y.o. male with history of HTN, tobacco abuse and recently diagnosed CAD (NSTEMI s/p DES to RI on 10/20/13, patent LM/LAD/RCA, EF 55%) and HLD who presents for post-hospital follow-up. He was started on routine post-MI therapy including ASA and Brilinta. During his admission he was found to have low TSH but normal free hormones. LDL was 182 and he was started on statin. He was counseled on tobacco and alcohol cessation. He was drinking 6-12 beers on Fridays and now drinks 1-2 beers every other day. He has done quite well since discharge. He has been gradually increasing his activity and does a lot of walking. No exertional chest pain or dyspnea. He did have an episode of chest discomfort on 9/21, lasting about 30 minutes, which occurred after a long walk (but not during). He has since gone on walks without any symptoms. The pain did not feel like his MI. It felt like a very mild feeling "like someone stepping on your finger." No associated symptoms or radiation. He feels well today and is eager to go back to work. He has been successful at cutting back smoking but has not yet quit.   Recent Labs: 10/18/2013: ALT 22; HDL Cholesterol by NMR 78; LDL (calc) 182*; TSH 0.276*  10/21/2013: Creatinine 0.93; Hemoglobin 12.9*; Potassium 4.6   Wt Readings from Last 3 Encounters:  10/30/13 160 lb 12.8 oz (72.938 kg)  10/21/13 160 lb 7.9 oz (72.8 kg)  10/21/13 160 lb 7.9 oz (72.8 kg)     Past Medical History  Diagnosis Date  . Hypertension   . CAD (coronary artery disease)     a. s/p STEMI 10/2013 - DES to ramus intermedius. Patent LM, LAD, RCA, EF 55%.  Marland Kitchen Hyperlipidemia LDL goal <70   . Low TSH level   . Tobacco abuse       Current Outpatient Prescriptions  Medication Sig Dispense Refill  . acetaminophen (TYLENOL) 325 MG tablet Take 2 tablets (650 mg total) by mouth every 4 (four) hours as needed for headache or mild pain.      Marland Kitchen aspirin EC 81 MG tablet Take 81 mg by mouth daily.      Marland Kitchen atorvastatin (LIPITOR) 80 MG tablet Take 1 tablet (80 mg total) by mouth daily at 6 PM.  30 tablet  6  . lisinopril-hydrochlorothiazide (PRINZIDE,ZESTORETIC) 20-25 MG per tablet Take 1 tablet by mouth daily.  30 tablet  2  . metoprolol tartrate (LOPRESSOR) 25 MG tablet Take 1 tablet (25 mg total) by mouth 2 (two) times daily.  60 tablet  6  . nitroGLYCERIN (NITROSTAT) 0.4 MG SL tablet Place 1 tablet (0.4 mg total) under the tongue every 5 (five) minutes x 3 doses as needed for chest pain.  25 tablet  4  . ticagrelor (BRILINTA) 90 MG TABS tablet Take 1 tablet (90 mg total) by mouth 2 (two) times daily. This is for regular prescription.  60 tablet  11   No current facility-administered medications for this visit.    Allergies:   Review of patient's allergies indicates no known allergies.   Social History:  The patient  reports that he has been smoking Cigarettes.  He has a 22.5 pack-year smoking history. He has never used smokeless tobacco. He reports that he drinks alcohol. He reports that he does not use illicit drugs.   Family History:  The patient's family history includes Heart attack in his father; Hypertension in his father; Stroke in his father.   ROS:  Please see the history of present illness.    All other systems reviewed and negative.   PHYSICAL EXAM:  VS:  BP 138/74  Pulse 72  Ht  (1.753 m)  Wt 160 lb 12.8 oz (72.938 kg)  BMI 23.74 kg/m2 Well nourished, well developed AAM, in no acute distress HEENT: normal Neck: no JVD Cardiac:  normal S1, S2; RRR; no murmur Lungs:  clear to auscultation bilaterally, no wheezing, rhonchi or rales Abd: soft, nontender, no hepatomegaly Ext: no edema, right wrist cath  site clean, dry, no ecchymosis; good pulse Skin: warm and dry Neuro:  moves all extremities spontaneously, no focal abnormalities noted  EKG:  NSR 72bpm, moderate LVH with early repolarization, similar to multiple prior tracings     ASSESSMENT AND PLAN:  1. CAD with NSTEMI 10/2013 s/p DES to RI, EF 55% - doing well post-cath. The episode of CP he had several days ago sounds atypical, was not exertional, and has not recurred. Return precautions reviewed. Continue aspirin, Brilinta, metoprolol, statin. Since EF was normal by cath, will defer to primary cardiologist whether or not he needs a baseline echo at some point. He is OK to return to work as tolerated. If he does develop recurrent sx, he knows to seek medical attention. He plans to start cardiac rehab in early October. 2. HTN - continue current regimen for now. This will be followed at cardiac rehab as well. 3. Hyperlipidemia - new to statin during hospitalization. Check CMET/lipids in 6-8 weeks. 4. Low TSH level - recheck today to exclude subclinical hyperthyroidism. 5. Tobacco abuse/habitual alcohol use - counseled regarding cessation of tobacco, as well as cutting down alcohol use.  Dispo: F/u Dr. Mayford Knife in 2 months.  Signed, Ronie Spies, PA-C  10/30/2013 11:30 AM

## 2013-11-05 ENCOUNTER — Telehealth (HOSPITAL_COMMUNITY): Payer: Self-pay | Admitting: *Deleted

## 2013-11-06 ENCOUNTER — Encounter (HOSPITAL_COMMUNITY)
Admission: RE | Admit: 2013-11-06 | Discharge: 2013-11-06 | Disposition: A | Discharge status: Home / Self Care | Payer: BC Managed Care – PPO | Source: Ambulatory Visit | Attending: Cardiology | Admitting: Cardiology

## 2013-11-06 DIAGNOSIS — Z823 Family history of stroke: Secondary | ICD-10-CM | POA: Insufficient documentation

## 2013-11-06 DIAGNOSIS — I252 Old myocardial infarction: Secondary | ICD-10-CM | POA: Insufficient documentation

## 2013-11-06 DIAGNOSIS — Z955 Presence of coronary angioplasty implant and graft: Secondary | ICD-10-CM | POA: Insufficient documentation

## 2013-11-06 DIAGNOSIS — E785 Hyperlipidemia, unspecified: Secondary | ICD-10-CM | POA: Insufficient documentation

## 2013-11-06 DIAGNOSIS — Z8249 Family history of ischemic heart disease and other diseases of the circulatory system: Secondary | ICD-10-CM | POA: Insufficient documentation

## 2013-11-06 DIAGNOSIS — I2510 Atherosclerotic heart disease of native coronary artery without angina pectoris: Secondary | ICD-10-CM | POA: Insufficient documentation

## 2013-11-06 DIAGNOSIS — F17200 Nicotine dependence, unspecified, uncomplicated: Secondary | ICD-10-CM | POA: Insufficient documentation

## 2013-11-06 DIAGNOSIS — I10 Essential (primary) hypertension: Secondary | ICD-10-CM | POA: Insufficient documentation

## 2013-11-06 DIAGNOSIS — Z5189 Encounter for other specified aftercare: Secondary | ICD-10-CM | POA: Insufficient documentation

## 2013-11-06 NOTE — Progress Notes (Signed)
Cardiac Rehab Medication Review by a Pharmacist  Does the patient  feel that his/her medications are working for him/her?  yes  Has the patient been experiencing any side effects to the medications prescribed?  no  Does the patient measure his/her own blood pressure or blood glucose at home?  yes  Checks his BP at [employer]. 130s/70s  Does the patient have any problems obtaining medications due to transportation or finances?   no  Understanding of regimen: good Understanding of indications: fair Potential of compliance: fair   Pharmacist comments: Pt reports being compliant with all of his medications and taking them as prescribed. Pt has a good understanding of his medications. Pt did report that the Brilinta is kind of expensive and may need assistance if possible in the future with that medication.     Armandina StammerBATCHELDER, J 11/06/2013 8:32 AM

## 2013-11-10 ENCOUNTER — Encounter (HOSPITAL_COMMUNITY): Payer: BC Managed Care – PPO

## 2013-11-12 ENCOUNTER — Encounter (HOSPITAL_COMMUNITY)
Admission: RE | Admit: 2013-11-12 | Discharge: 2013-11-12 | Disposition: A | Discharge status: Home / Self Care | Payer: BC Managed Care – PPO | Source: Ambulatory Visit | Attending: Cardiology | Admitting: Cardiology

## 2013-11-12 ENCOUNTER — Encounter (HOSPITAL_COMMUNITY): Payer: BC Managed Care – PPO

## 2013-11-12 DIAGNOSIS — E785 Hyperlipidemia, unspecified: Secondary | ICD-10-CM | POA: Diagnosis not present

## 2013-11-12 DIAGNOSIS — F17200 Nicotine dependence, unspecified, uncomplicated: Secondary | ICD-10-CM | POA: Diagnosis not present

## 2013-11-12 DIAGNOSIS — Z5189 Encounter for other specified aftercare: Secondary | ICD-10-CM | POA: Diagnosis present

## 2013-11-12 DIAGNOSIS — Z823 Family history of stroke: Secondary | ICD-10-CM | POA: Diagnosis not present

## 2013-11-12 DIAGNOSIS — I252 Old myocardial infarction: Secondary | ICD-10-CM | POA: Diagnosis not present

## 2013-11-12 DIAGNOSIS — Z955 Presence of coronary angioplasty implant and graft: Secondary | ICD-10-CM | POA: Diagnosis not present

## 2013-11-12 DIAGNOSIS — I10 Essential (primary) hypertension: Secondary | ICD-10-CM | POA: Diagnosis not present

## 2013-11-12 DIAGNOSIS — Z8249 Family history of ischemic heart disease and other diseases of the circulatory system: Secondary | ICD-10-CM | POA: Diagnosis not present

## 2013-11-12 DIAGNOSIS — I2510 Atherosclerotic heart disease of native coronary artery without angina pectoris: Secondary | ICD-10-CM | POA: Diagnosis not present

## 2013-11-12 NOTE — Progress Notes (Signed)
Pt started cardiac rehab today.  Pt tolerated light exercise without difficulty. Telemetry rhythm Sinus vital signs stable. Joseph FearingJames short term goals are to get his body in shape . Long term goals are to relax.  PHQ score=0. Will continue to monitor the patient throughout  the program.

## 2013-11-14 ENCOUNTER — Encounter (HOSPITAL_COMMUNITY): Payer: BC Managed Care – PPO

## 2013-11-14 ENCOUNTER — Encounter (HOSPITAL_COMMUNITY)
Admission: RE | Admit: 2013-11-14 | Discharge: 2013-11-14 | Disposition: A | Discharge status: Home / Self Care | Payer: BC Managed Care – PPO | Source: Ambulatory Visit | Attending: Cardiology | Admitting: Cardiology

## 2013-11-14 DIAGNOSIS — Z5189 Encounter for other specified aftercare: Secondary | ICD-10-CM | POA: Diagnosis not present

## 2013-11-17 ENCOUNTER — Encounter (HOSPITAL_COMMUNITY): Payer: BC Managed Care – PPO

## 2013-11-19 ENCOUNTER — Encounter (HOSPITAL_COMMUNITY): Payer: BC Managed Care – PPO

## 2013-11-19 ENCOUNTER — Encounter (HOSPITAL_COMMUNITY)
Admission: RE | Admit: 2013-11-19 | Discharge: 2013-11-19 | Disposition: A | Discharge status: Home / Self Care | Payer: BC Managed Care – PPO | Source: Ambulatory Visit | Attending: Cardiology | Admitting: Cardiology

## 2013-11-19 DIAGNOSIS — Z5189 Encounter for other specified aftercare: Secondary | ICD-10-CM | POA: Diagnosis not present

## 2013-11-21 ENCOUNTER — Encounter (HOSPITAL_COMMUNITY): Payer: BC Managed Care – PPO

## 2013-11-24 ENCOUNTER — Encounter (HOSPITAL_COMMUNITY): Payer: BC Managed Care – PPO

## 2013-11-26 ENCOUNTER — Encounter (HOSPITAL_COMMUNITY): Payer: BC Managed Care – PPO

## 2013-11-28 ENCOUNTER — Encounter (HOSPITAL_COMMUNITY): Admission: RE | Admit: 2013-11-28 | Payer: BC Managed Care – PPO | Source: Ambulatory Visit

## 2013-11-28 ENCOUNTER — Encounter (HOSPITAL_COMMUNITY): Payer: BC Managed Care – PPO

## 2013-12-01 ENCOUNTER — Encounter (HOSPITAL_COMMUNITY): Payer: BC Managed Care – PPO

## 2013-12-03 ENCOUNTER — Encounter (HOSPITAL_COMMUNITY): Payer: BC Managed Care – PPO

## 2013-12-03 ENCOUNTER — Encounter (HOSPITAL_COMMUNITY)
Admission: RE | Admit: 2013-12-03 | Discharge: 2013-12-03 | Disposition: A | Discharge status: Home / Self Care | Payer: BC Managed Care – PPO | Source: Ambulatory Visit | Attending: Cardiology | Admitting: Cardiology

## 2013-12-03 DIAGNOSIS — Z5189 Encounter for other specified aftercare: Secondary | ICD-10-CM | POA: Diagnosis not present

## 2013-12-03 NOTE — Progress Notes (Signed)
Joseph Carr returned after being out with a cold. I reviewed Joseph Carr quality of life questionnaire with him today. Joseph Carr had low overall scores and low score in the psychosocial and family areas.  Joseph Carr denies being depressed and says thing are better not that he is back to work. Will forward [name] quality of life questionnaire for Dr Mayford Knifeurner to review.

## 2013-12-05 ENCOUNTER — Encounter (HOSPITAL_COMMUNITY): Payer: BC Managed Care – PPO

## 2013-12-05 ENCOUNTER — Encounter (HOSPITAL_COMMUNITY)
Admission: RE | Admit: 2013-12-05 | Discharge: 2013-12-05 | Disposition: A | Discharge status: Home / Self Care | Payer: BC Managed Care – PPO | Source: Ambulatory Visit | Attending: Cardiology | Admitting: Cardiology

## 2013-12-05 DIAGNOSIS — Z5189 Encounter for other specified aftercare: Secondary | ICD-10-CM | POA: Diagnosis not present

## 2013-12-05 NOTE — Progress Notes (Signed)
Reviewed home exercise with pt today.  Pt plans to walk at home for exercise.  Reviewed THR, pulse, RPE, sign and symptoms, NTG use, and when to call 911 or MD.  Pt voiced understanding. Jessica Hawkins, MA, ACSM RCEP   

## 2013-12-08 ENCOUNTER — Encounter (HOSPITAL_COMMUNITY): Payer: BC Managed Care – PPO

## 2013-12-10 ENCOUNTER — Encounter (HOSPITAL_COMMUNITY): Payer: BC Managed Care – PPO

## 2013-12-10 ENCOUNTER — Other Ambulatory Visit: Payer: Self-pay

## 2013-12-10 ENCOUNTER — Encounter (HOSPITAL_COMMUNITY)
Admission: RE | Admit: 2013-12-10 | Discharge: 2013-12-10 | Disposition: A | Discharge status: Home / Self Care | Payer: BC Managed Care – PPO | Source: Ambulatory Visit | Attending: Cardiology | Admitting: Cardiology

## 2013-12-10 ENCOUNTER — Telehealth: Payer: Self-pay | Admitting: Physician Assistant

## 2013-12-10 DIAGNOSIS — I10 Essential (primary) hypertension: Secondary | ICD-10-CM | POA: Diagnosis not present

## 2013-12-10 DIAGNOSIS — F17200 Nicotine dependence, unspecified, uncomplicated: Secondary | ICD-10-CM | POA: Insufficient documentation

## 2013-12-10 DIAGNOSIS — E785 Hyperlipidemia, unspecified: Secondary | ICD-10-CM | POA: Diagnosis not present

## 2013-12-10 DIAGNOSIS — Z955 Presence of coronary angioplasty implant and graft: Secondary | ICD-10-CM | POA: Insufficient documentation

## 2013-12-10 DIAGNOSIS — Z5189 Encounter for other specified aftercare: Secondary | ICD-10-CM | POA: Insufficient documentation

## 2013-12-10 DIAGNOSIS — Z823 Family history of stroke: Secondary | ICD-10-CM | POA: Diagnosis not present

## 2013-12-10 DIAGNOSIS — I252 Old myocardial infarction: Secondary | ICD-10-CM | POA: Insufficient documentation

## 2013-12-10 DIAGNOSIS — I2510 Atherosclerotic heart disease of native coronary artery without angina pectoris: Secondary | ICD-10-CM | POA: Diagnosis not present

## 2013-12-10 DIAGNOSIS — Z8249 Family history of ischemic heart disease and other diseases of the circulatory system: Secondary | ICD-10-CM | POA: Diagnosis not present

## 2013-12-10 NOTE — Telephone Encounter (Signed)
43 yo male who underwent recent cath which reveal single vessel dx and got DES to ramus present for cardiac rehab today. According to the patient, he has been compliant with ASA and Brilinta since discharge. He start the CP started during work and it felt different from his previous CP during MI. Patient currently no longer having CP. I have discussed the case with Dr. Tresa EndoKelly who recommend close outpatient evaluation given he's currently stable. EKG was obtained by cardiac rehab, does have some mild early repol/mild ST elevation in inferior lead, however this was present since Sept EKG as well. Patient is currently CP free and feels good. I have instructed patient to seek medical attention at nearest ED if continue to have recurrent CP lasting longer than 20 mins, otherwise I have scheduled a [facility] visit for tomorrow at 10am with [name] who will determine if he need outpatient stress test.  Signed, Azalee CourseHao  PA Pager: [phone]

## 2013-12-10 NOTE — Progress Notes (Signed)
Patient reported having chest discomfort at work this morning. Mr Joseph Carr reported having mid sternal chest pain that started at about 0930 this morning and lasted two hours.Patient also reported having some numbness in his second and third left digit Mr Joseph Carr said he was at work and sat down to rest while he was having chest discomfort. The pain did go away. Mr Joseph Carr did not take any nitroglycerin. Joseph FearingJames said he had chest burning that he experienced that started at 2:00 pm this afternoon. Blood pressure 118/80. Telemetry rhythm Sinus 75. Oxygen saturation 98% on room air. Joseph Carr rate the discomfort as a 2 on a 1-10 scale.  12 lead ECG obtained. Joseph FearingJames said the chest burning he experienced this afternoon stopped at about 3:15 pm. Joseph FearingJames did not exercise this afternoon at cardiac rehab. Joseph FearingJames said he has stopped smoking all together. Joseph Carr PAC called and notified.

## 2013-12-10 NOTE — Progress Notes (Signed)
[name] called back appointment given for patient to follow up [name] at 10:00 AM. Patient given a copy of his exercise flow sheet to [facility] to [name]. Patient remained symptom free and knows to go to the emergency room if he has a reoccur ance of chest pain.

## 2013-12-11 ENCOUNTER — Encounter: Payer: Self-pay | Admitting: Physician Assistant

## 2013-12-11 ENCOUNTER — Ambulatory Visit (INDEPENDENT_AMBULATORY_CARE_PROVIDER_SITE_OTHER): Payer: BC Managed Care – PPO | Admitting: Physician Assistant

## 2013-12-11 ENCOUNTER — Telehealth: Payer: Self-pay | Admitting: *Deleted

## 2013-12-11 VITALS — BP 110/78 | HR 78 | Ht 69.0 in | Wt 157.0 lb

## 2013-12-11 DIAGNOSIS — I10 Essential (primary) hypertension: Secondary | ICD-10-CM

## 2013-12-11 DIAGNOSIS — R079 Chest pain, unspecified: Secondary | ICD-10-CM

## 2013-12-11 DIAGNOSIS — E785 Hyperlipidemia, unspecified: Secondary | ICD-10-CM

## 2013-12-11 DIAGNOSIS — Z87891 Personal history of nicotine dependence: Secondary | ICD-10-CM

## 2013-12-11 DIAGNOSIS — I25119 Atherosclerotic heart disease of native coronary artery with unspecified angina pectoris: Secondary | ICD-10-CM

## 2013-12-11 LAB — TROPONIN I: Troponin I: <0.3 ng/mL (ref <0.30)

## 2013-12-11 NOTE — Telephone Encounter (Signed)
pt and his wife notified about normal Troponin.

## 2013-12-11 NOTE — Patient Instructions (Signed)
Your physician recommends that you continue on your current medications as directed. Please refer to the Current Medication list given to you today.  Your physician has requested that you have en exercise stress myoview THIS IS TO BE DONE IN 1-2 WEEKS. For further information please visit [email or web address]. Please follow instruction sheet, as given.  STAT TROPONIN I TODAY   Your physician recommends that you schedule a follow-up appointment in: 2 WEEKS WITH SCOTT WEAVER, PAC SAME DAY DR. Mayford KnifeURNER IS IN THE OFFICE

## 2013-12-11 NOTE — Progress Notes (Signed)
Cardiology Office Note   Date:  12/11/2013   ID:  Joseph Carr, DOB 24-Feb-1970, MRN [id number]  PCP:  Orland PenmanJARALLA SHAMLEFFER, IBTEHAL, MD  Cardiologist:  Dr. Armanda Magicraci Turner    History of Present Illness: Joseph Carr is a 43 y.o. male with a history of CAD, HTN, tobacco abuse, HL. He was admitted 9/15 with a non-STEMI. He underwent DES to the ramus intermediate. Last seen in the office 10/30/13. He was seen by one of our physician assistants in cardiac rehabilitation yesterday for chest pain. He was added on my schedule for further evaluation.  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  He had some tingling in his left hand fingertips.  He also had some back pain associated.  Denies associated dyspnea.  He feels like his symptoms were similar to his prior angina.  This was not as severe.  He did not take NTG.  Symptoms lasted about 1.5 hours.  His chest pain was gone by the time he got to cardiac rehab.  He has been otherwise active without chest pain. He goes to cardiac rehab regularly.  He denies significant dyspnea, orthopnea, PND, edema.  He denies syncope.     Studies:  - LHC (9/15):  EF 55%,Proximal RI 60-70% >> FFR 0.74  >> PCI: Promus 2.5 x 20 mm DES to the RI   Recent Labs/Images: 10/21/2013: Hemoglobin 12.9* 10/30/2013: ALT 30; BUN 16; Creatinine 1.1; LDL (calc) 97; Potassium 3.8; Sodium 139; TSH 0.32*     Wt Readings from Last 3 Encounters:  12/11/13 157 lb (71.215 kg)  11/06/13 162 lb 14.7 oz (73.9 kg)  10/30/13 160 lb 12.8 oz (72.938 kg)     Past Medical History  Diagnosis Date  . Hypertension   . CAD (coronary artery disease)     a. s/p STEMI 10/2013 - DES to ramus intermedius. Patent LM, LAD, RCA, EF 55%.  Marland Kitchen. Hyperlipidemia LDL goal <70   . Low TSH level   . Tobacco abuse     Current Outpatient Prescriptions  Medication Sig Dispense Refill  . acetaminophen  (TYLENOL) 325 MG tablet Take 2 tablets (650 mg total) by mouth every 4 (four) hours as needed for headache or mild pain.    Marland Kitchen. aspirin EC 81 MG tablet Take 81 mg by mouth daily.    Marland Kitchen. atorvastatin (LIPITOR) 80 MG tablet Take 1 tablet (80 mg total) by mouth daily at 6 PM. 30 tablet 6  . lisinopril-hydrochlorothiazide (PRINZIDE,ZESTORETIC) 20-25 MG per tablet Take 1 tablet by mouth daily. 30 tablet 2  . metoprolol tartrate (LOPRESSOR) 25 MG tablet Take 1 tablet (25 mg total) by mouth 2 (two) times daily. 60 tablet 6  . nitroGLYCERIN (NITROSTAT) 0.4 MG SL tablet Place 1 tablet (0.4 mg total) under the tongue every 5 (five) minutes x 3 doses as needed for chest pain. 25 tablet 4  . ticagrelor (BRILINTA) 90 MG TABS tablet Take 1 tablet (90 mg total) by mouth 2 (two) times daily. This is for regular prescription. 60 tablet 11   No current facility-administered medications for this visit.     Allergies:   Review of patient's allergies indicates no known allergies.   Social History:  The patient  reports that he quit smoking about 4 weeks ago. His smoking use included Cigarettes. He has a 22.5 pack-year smoking history. He has never used smokeless tobacco. He reports  that he drinks alcohol. He reports that he does not use illicit drugs.   Family History:  The patient's family history includes Heart attack in his father; Hypertension in his father; Stroke in his father.   ROS:  Please see the history of present illness.       All other systems reviewed and negative.    PHYSICAL EXAM: VS:  BP 110/78 mmHg  Pulse 78  Ht 5\' 9"  (1.753 m)  Wt 157 lb (71.215 kg)  BMI 23.17 kg/m2 Well nourished, well developed, in no acute distress HEENT: normal Neck:  no JVD Cardiac:  normal S1, S2;  RRR; no murmur Lungs:   clear to auscultation bilaterally, no wheezing, rhonchi or rales Abd: soft, nontender, no hepatomegaly Ext:  no edema Skin: warm and dry Neuro:  CNs 2-12 intact, no focal abnormalities  noted  EKG:  NSR, HR 71, normal axis, j point elevation, no change from prior tracing.      ASSESSMENT AND PLAN:  1.  Chest pain, unspecified chest pain type:  Symptoms are somewhat concerning as they are reminiscent of his previous angina. However, he has been exerting himself without significant symptoms. His ECG is unchanged. He feels well today. Review of his cardiac catheterization from September demonstrates single-vessel disease which was treated with PCI. He has been compliant with dual antiplatelet therapy.    -  Stat troponin today. If elevated, sent the ED with plans for cardiac catheterization.    -  Arrange ETT-Myoview (if troponin negative).    -  Patient advised to go the emergency room if he has recurrent chest discomfort.    -  Hold off on cardiac rehabilitation until stress test complete. 2.  Coronary artery disease:  Proceed with stress Myoview if troponin negative as noted above. Continue aspirin, Brilinta, statin, beta blocker. 3.  Essential hypertension:  Controlled. 4.  HLD (hyperlipidemia):  Continue statin. 5.  Former Cigarette Smoker:  He has stopped smoking.  Disposition:   FU with me or Dr. Armanda Magicraci Turner 2 weeks.   Signed, Brynda RimScott , PA-C, MHS 12/11/2013 10:46 AM    [facility] [address], [place], [place]  [address] Phone: [phone]; Fax: [phone]

## 2013-12-12 ENCOUNTER — Encounter (HOSPITAL_COMMUNITY): Payer: BC Managed Care – PPO

## 2013-12-15 ENCOUNTER — Encounter (HOSPITAL_COMMUNITY): Payer: BC Managed Care – PPO

## 2013-12-17 ENCOUNTER — Encounter (HOSPITAL_COMMUNITY): Payer: BC Managed Care – PPO

## 2013-12-19 ENCOUNTER — Ambulatory Visit (HOSPITAL_COMMUNITY): Payer: BC Managed Care – PPO | Attending: Cardiovascular Disease | Admitting: Radiology

## 2013-12-19 ENCOUNTER — Encounter (HOSPITAL_COMMUNITY): Payer: BC Managed Care – PPO

## 2013-12-19 ENCOUNTER — Encounter: Payer: Self-pay | Admitting: Cardiology

## 2013-12-19 DIAGNOSIS — I25119 Atherosclerotic heart disease of native coronary artery with unspecified angina pectoris: Secondary | ICD-10-CM

## 2013-12-19 DIAGNOSIS — I10 Essential (primary) hypertension: Secondary | ICD-10-CM | POA: Diagnosis not present

## 2013-12-19 DIAGNOSIS — R079 Chest pain, unspecified: Secondary | ICD-10-CM | POA: Diagnosis not present

## 2013-12-19 DIAGNOSIS — I2510 Atherosclerotic heart disease of native coronary artery without angina pectoris: Secondary | ICD-10-CM | POA: Insufficient documentation

## 2013-12-19 DIAGNOSIS — E785 Hyperlipidemia, unspecified: Secondary | ICD-10-CM

## 2013-12-19 MED ORDER — TECHNETIUM TC 99M SESTAMIBI GENERIC - CARDIOLITE
10.0000 | Freq: Once | INTRAVENOUS | Status: AC | PRN
  Administered 2013-12-19: 10 via INTRAVENOUS

## 2013-12-19 MED ORDER — REGADENOSON 0.4 MG/5ML IV SOLN
0.4000 mg | Freq: Once | INTRAVENOUS | Status: AC
  Administered 2013-12-19: 0.4 mg via INTRAVENOUS

## 2013-12-19 MED ORDER — TECHNETIUM TC 99M SESTAMIBI GENERIC - CARDIOLITE
30.0000 | Freq: Once | INTRAVENOUS | Status: AC | PRN
  Administered 2013-12-19: 30 via INTRAVENOUS

## 2013-12-19 NOTE — Progress Notes (Signed)
[facility] SITE 3 NUCLEAR MED [address] [place], [place] [address] [phone]    Cardiology Nuclear Med Study  Joseph RolesJames M Carr is a 43 y.o. male     MRN : [id number]     DOB: 08/01/1970  Procedure Date: 12/19/2013  Nuclear Med Background Indication for Stress Test:  Evaluation for Ischemia and Follow up CAD, recent MI History:  CAD Cardiac Risk Factors: Hypertension  Symptoms:  Chest Pain (last date of chest discomfort was one month ago)   Nuclear Pre-Procedure Caffeine/Decaff Intake:  None NPO After: 12:00am   Lungs:  clear O2 Sat: 98% on room air. IV 0.9% NS with Angio Cath:  22g  IV Site: R Hand  IV Started by:  Bonnita LevanJackie Smith, RN  Chest Size (in):  44 Cup Size: n/a  Height: 5\' 9"  (1.753 m)  Weight:  158 lb (71.668 kg)  BMI:  Body mass index is 23.32 kg/(m^2). Tech Comments:  N/A    Nuclear Med Study 1 or 2 day study: 1 day  Stress Test Type:  Treadmill/Lexiscan  Reading MD: N/A  Order Authorizing Provider:  Armanda Magicraci Turner, MD  Resting Radionuclide: Technetium 852m Sestamibi  Resting Radionuclide Dose: 11.0 mCi   Stress Radionuclide:  Technetium 4852m Sestamibi  Stress Radionuclide Dose: 33.0 mCi           Stress Protocol Rest HR: 67 Stress HR: 116  Rest BP: 143/96 Stress BP: 159/76  Exercise Time (min): n/a METS: n/a   Predicted Max HR: 176 bpm % Max HR: 65.91 bpm Rate Pressure Product: [id number]   Dose of Adenosine (mg):  n/a Dose of Lexiscan: 0.4 mg  Dose of Atropine (mg): n/a Dose of Dobutamine: n/a mcg/kg/min (at max HR)  Stress Test Technologist: Nelson ChimesSharon Brooks, BS-ES  Nuclear Technologist:  Kerby NoraElzbieta Kubak, CNMT     Rest Procedure:  Myocardial perfusion imaging was performed at rest 45 minutes following the intravenous administration of Technetium 1352m Sestamibi. Rest ECG: NSR 67 bpm  Nonspecific ST T wave changes .   Stress Procedure:  The patient received IV Lexiscan 0.4 mg over 15-seconds with concurrent low level exercise and then Technetium  2852m Sestamibi was injected at 30-seconds while the patient continued walking one more minute.  Quantitative spect images were obtained after a 45-minute delay.  Attempted to walk patient on Bruce Protocol but his heart rate would not increase before he fatigued and had to stop.  Switched to low level Lexiscan.  During the infusion the patient complained of SOB, dizziness and had a cough.  These symptoms began to resolve in recovery.  Stress ECG: No significant ST segment change suggestive of ischemia.  QPS Raw Data Images:  Soft tissue (diaphragm, bowel activity) underlies the inferior wall  Stress Images:  Medium sized defect involving the inferioer wall (base, mid, distal), inferolateral wall ( mid, distal)  Otherwise normal perfusion.   Rest Images:  Compared to initial stress images there was minimal change in the inferior wall (mid) Subtraction (SDS):  No signif ischemia.   Transient Ischemic Dilatation (Normal <1.22):  1.00 Lung/Heart Ratio (Normal <0.45):  0.28  Quantitative Gated Spect Images QGS EDV:  122 ml QGS ESV:  59 ml  Impression Exercise Capacity:  Lexiscan with low level exercise. BP Response:  Normal blood pressure response. Clinical Symptoms:  No chest pain. ECG Impression:  No significant ST segment change suggestive of ischemia. Comparison with Prior Nuclear Study: no prior nuclear study  Overall Impression:  Myoview scan with inferior/inferolateral  fixed defect consistent with soft tissue attenuation and / or subendocardial scar  No significatn ischemia. Overall low to intermediate risk scan.    LV Ejection Fraction: 51%.  LV Wall Motion:  Normal wall thickening    Joseph PatesPaula

## 2013-12-22 ENCOUNTER — Telehealth: Payer: Self-pay | Admitting: *Deleted

## 2013-12-22 ENCOUNTER — Encounter: Payer: Self-pay | Admitting: Physician Assistant

## 2013-12-22 ENCOUNTER — Encounter (HOSPITAL_COMMUNITY): Payer: BC Managed Care – PPO

## 2013-12-22 NOTE — Telephone Encounter (Signed)
s/w pt's wife who states she is not at home and pt is at home but she has the cell phone. I asked if 6 pm tomorrow would be a good time for me to cb, she said that will be fine.

## 2013-12-24 ENCOUNTER — Encounter (HOSPITAL_COMMUNITY): Payer: BC Managed Care – PPO

## 2013-12-24 NOTE — Telephone Encounter (Signed)
F/u   Pt want a call back at 6pm concerning labs results.

## 2013-12-26 ENCOUNTER — Encounter (HOSPITAL_COMMUNITY): Payer: BC Managed Care – PPO

## 2013-12-26 ENCOUNTER — Encounter: Payer: Self-pay | Admitting: *Deleted

## 2013-12-29 ENCOUNTER — Ambulatory Visit (INDEPENDENT_AMBULATORY_CARE_PROVIDER_SITE_OTHER): Payer: BC Managed Care – PPO | Admitting: Cardiology

## 2013-12-29 ENCOUNTER — Encounter: Payer: Self-pay | Admitting: Cardiology

## 2013-12-29 ENCOUNTER — Encounter (HOSPITAL_COMMUNITY): Payer: BC Managed Care – PPO

## 2013-12-29 DIAGNOSIS — E785 Hyperlipidemia, unspecified: Secondary | ICD-10-CM

## 2013-12-29 DIAGNOSIS — Z9861 Coronary angioplasty status: Secondary | ICD-10-CM

## 2013-12-29 DIAGNOSIS — I2510 Atherosclerotic heart disease of native coronary artery without angina pectoris: Secondary | ICD-10-CM

## 2013-12-29 DIAGNOSIS — I10 Essential (primary) hypertension: Secondary | ICD-10-CM

## 2013-12-29 NOTE — Patient Instructions (Signed)
Your physician recommends that you continue on your current medications as directed. Please refer to the Current Medication list given to you today.  Your physician wants you to follow-up in: 6 months with Dr Turner You will receive a reminder letter in the mail two months in advance. If you don't receive a letter, please call our office to schedule the follow-up appointment.  

## 2013-12-29 NOTE — Progress Notes (Signed)
[address] [place], [place]  [address] Phone: [phone] Fax:  [phone]  Date:  12/29/2013   ID:  Joseph Carr, DOB Jun 14, 1970, MRN [id number]  PCP:  Joseph PenmanJARALLA Carr, IBTEHAL, Joseph Carr  Cardiologist:  Joseph Carr, Joseph Carr    History of Present Illness: Joseph Carr is a 43 y.o. male with a history of CAD, HTN, tobacco abuse, HL. He was admitted 9/15 with a non-STEMI. He underwent DES to the ramus intermediate. He recently had an episode of CP in cardiac rehab.   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 He had some tingling in his left hand fingertips. He also had some back pain associated. Denied associated dyspnea. He felt like his symptoms were similar to his prior angina. This was not as severe. He did not take NTG. Symptoms lasted about 1.5 hours. His chest pain was gone by the time he got to cardiac rehab. He has been otherwise active without chest pain. He goes to cardiac rehab regularly. He denies significant dyspnea, orthopnea, PND, edema. He denies syncope. He underwent stress myoview that did not show any ischemia.  He denies any further CP, SOB, dizziness, palpitations or syncope.   Wt Readings from Last 3 Encounters:  12/29/13 158 lb (71.668 kg)  12/19/13 158 lb (71.668 kg)  12/11/13 157 lb (71.215 kg)     Past Medical History  Diagnosis Date  . Hypertension   . CAD (coronary artery disease)     a. s/p STEMI 10/2013 - DES to ramus intermedius. Patent LM, LAD, RCA, EF 55%.  Marland Kitchen. Hyperlipidemia LDL goal <70   . Low TSH level   . Tobacco abuse   . Hx of cardiovascular stress test     ETT/Lexiscan Myoview (11/15):  Inferior/inferolateral fixed defect c/w soft tissue attenuation and / or subendocardial scar No significant ischemia. Overall low to intermediate risk scan.LV Ejection Fraction: 51%.    Current Outpatient Prescriptions  Medication Sig Dispense  Refill  . acetaminophen (TYLENOL) 325 MG tablet Take 2 tablets (650 mg total) by mouth every 4 (four) hours as needed for headache or mild pain.    Marland Kitchen. aspirin EC 81 MG tablet Take 81 mg by mouth daily.    Marland Kitchen. atorvastatin (LIPITOR) 80 MG tablet Take 1 tablet (80 mg total) by mouth daily at 6 PM. 30 tablet 6  . lisinopril-hydrochlorothiazide (PRINZIDE,ZESTORETIC) 20-25 MG per tablet Take 1 tablet by mouth daily. 30 tablet 2  . metoprolol tartrate (LOPRESSOR) 25 MG tablet Take 1 tablet (25 mg total) by mouth 2 (two) times daily. 60 tablet 6  . nitroGLYCERIN (NITROSTAT) 0.4 MG SL tablet Place 1 tablet (0.4 mg total) under the tongue every 5 (five) minutes x 3 doses as needed for chest pain. 25 tablet 4  . ticagrelor (BRILINTA) 90 MG TABS tablet Take 1 tablet (90 mg total) by mouth 2 (two) times daily. This is for regular prescription. 60 tablet 11   No current facility-administered medications for this visit.    Allergies:   No Known Allergies  Social History:  The patient  reports that he quit smoking about 7 weeks ago. His smoking use included Cigarettes. He has a 22.5 pack-year smoking history. He has never used smokeless tobacco. He reports that he drinks alcohol. He reports that he does not use illicit drugs.   Family History:  The patient's family history includes Heart attack in  his father; Hypertension in his father; Stroke in his father.   ROS:  Please see the history of present illness.      All other systems reviewed and negative.   PHYSICAL EXAM: VS:  BP 120/80 mmHg  Pulse 85  Ht 5\' 9"  (1.753 m)  Wt 158 lb (71.668 kg)  BMI 23.32 kg/m2  SpO2 97% Well nourished, well developed, in no acute distress HEENT: normal Neck: no JVD Cardiac:  normal S1, S2; RRR; no murmur Lungs:  clear to auscultation bilaterally, no wheezing, rhonchi or rales Abd: soft, nontender, no hepatomegaly Ext: no edema Skin: warm and dry Neuro:  CNs 2-12 intact, no focal abnormalities noted  ASSESSMENT AND  PLAN:  1. Chest pain, unspecified chest pain type:stress myoview with no ischemia. 2. Coronary artery disease: S/P PCI of RI - Continue aspirin, Brilinta, statin, beta blocker. 3. Essential hypertension: Controlled.  Continue BB/ACE I/ diuretic 4. HLD (hyperlipidemia): Continue statin.  His last LDL was not at goal and statin adjusted.  Will repeat FLP and ALT. 5. Former Cigarette Smoker: He has stopped smoking.     Followup with me in 6 months  Signed, Joseph Carr, Joseph Carr [facility] 12/29/2013 11:34 AM

## 2013-12-30 ENCOUNTER — Telehealth (HOSPITAL_COMMUNITY): Payer: Self-pay | Admitting: *Deleted

## 2013-12-30 ENCOUNTER — Telehealth: Payer: Self-pay

## 2013-12-30 ENCOUNTER — Other Ambulatory Visit (INDEPENDENT_AMBULATORY_CARE_PROVIDER_SITE_OTHER): Payer: BC Managed Care – PPO | Admitting: *Deleted

## 2013-12-30 DIAGNOSIS — E785 Hyperlipidemia, unspecified: Secondary | ICD-10-CM

## 2013-12-30 DIAGNOSIS — I10 Essential (primary) hypertension: Secondary | ICD-10-CM

## 2013-12-30 DIAGNOSIS — Z9861 Coronary angioplasty status: Secondary | ICD-10-CM

## 2013-12-30 DIAGNOSIS — I2510 Atherosclerotic heart disease of native coronary artery without angina pectoris: Secondary | ICD-10-CM

## 2013-12-30 LAB — HEPATIC FUNCTION PANEL
ALBUMIN: 4.1 g/dL (ref 3.5–5.2)
ALT: 32 U/L (ref 0–53)
AST: 26 U/L (ref 0–37)
Alkaline Phosphatase: 46 U/L (ref 39–117)
Bilirubin, Direct: 0 mg/dL (ref 0.0–0.3)
Total Bilirubin: 0.6 mg/dL (ref 0.2–1.2)
Total Protein: 7.3 g/dL (ref 6.0–8.3)

## 2013-12-30 LAB — LIPID PANEL
CHOL/HDL RATIO: 3
Cholesterol: 148 mg/dL (ref 0–200)
HDL: 42.6 mg/dL (ref >39.00)
LDL Cholesterol: 79 mg/dL (ref 0–99)
NonHDL: 105.4
Triglycerides: 133 mg/dL (ref 0.0–149.0)
VLDL: 26.6 mg/dL (ref 0.0–40.0)

## 2013-12-30 LAB — BASIC METABOLIC PANEL
BUN: 13 mg/dL (ref 6–23)
CALCIUM: 9.5 mg/dL (ref 8.4–10.5)
CO2: 27 meq/L (ref 19–32)
Chloride: 102 mEq/L (ref 96–112)
Creatinine, Ser: 1 mg/dL (ref 0.4–1.5)
GFR: 100.67 mL/min (ref >60.00)
GLUCOSE: 91 mg/dL (ref 70–99)
POTASSIUM: 3.9 meq/L (ref 3.5–5.1)
Sodium: 138 mEq/L (ref 135–145)

## 2013-12-30 NOTE — Telephone Encounter (Signed)
Pt was advised of stress test results on 11/23 during his visit with Dr. Mayford Knifeurner.

## 2013-12-30 NOTE — Telephone Encounter (Signed)
OK to resume cardiac rehab.

## 2013-12-30 NOTE — Telephone Encounter (Signed)
[facility]: "Would you mind checking with Dr Mayford Knifeurner to see if she is okay with Mr Joseph Carr resuming exercise at cardiac rehab? Mr Joseph Carr saw Dr Mayford Knifeurner in the office on 12/29/2013. Mr Joseph Carr had a recent stress test that was low probability for ischemia.   Thanks for your assistance,   Byrd HesselbachMaria"  To Dr. Mayford Knifeurner for review and recommendations.

## 2013-12-31 ENCOUNTER — Encounter (HOSPITAL_COMMUNITY): Payer: BC Managed Care – PPO

## 2013-12-31 NOTE — Telephone Encounter (Signed)
Printed and placed in "to be faxed" bin in Medical Records.

## 2013-12-31 NOTE — Telephone Encounter (Signed)
Staff [facility] and I will send note.

## 2014-01-02 ENCOUNTER — Encounter (HOSPITAL_COMMUNITY): Payer: BC Managed Care – PPO

## 2014-01-05 ENCOUNTER — Ambulatory Visit: Payer: BC Managed Care – PPO | Admitting: Cardiology

## 2014-01-05 ENCOUNTER — Encounter (HOSPITAL_COMMUNITY): Payer: BC Managed Care – PPO

## 2014-01-07 ENCOUNTER — Encounter (HOSPITAL_COMMUNITY): Payer: BC Managed Care – PPO

## 2014-01-08 ENCOUNTER — Ambulatory Visit (INDEPENDENT_AMBULATORY_CARE_PROVIDER_SITE_OTHER): Payer: BC Managed Care – PPO | Admitting: Cardiology

## 2014-01-08 VITALS — BP 124/78 | HR 89 | Ht 69.0 in | Wt 166.0 lb

## 2014-01-08 DIAGNOSIS — E785 Hyperlipidemia, unspecified: Secondary | ICD-10-CM

## 2014-01-08 DIAGNOSIS — I2510 Atherosclerotic heart disease of native coronary artery without angina pectoris: Secondary | ICD-10-CM

## 2014-01-08 DIAGNOSIS — Z9861 Coronary angioplasty status: Principal | ICD-10-CM

## 2014-01-08 DIAGNOSIS — I10 Essential (primary) hypertension: Secondary | ICD-10-CM

## 2014-01-08 MED ORDER — EZETIMIBE 10 MG PO TABS: 10.0000 mg | ORAL_TABLET | Freq: Every day | ORAL | Status: DC

## 2014-01-08 NOTE — Patient Instructions (Addendum)
Your physician has recommended you make the following change in your medication:  1) START Zetia 10 mg daily  Your physician recommends that you return for FASTING lab work in: 8 weeks.  Your physician wants you to follow-up in: 6 months with Dr. Mayford Knifeurner. You will receive a reminder letter in the mail two months in advance. If you don't receive a letter, please call our office to schedule the follow-up appointment.

## 2014-01-08 NOTE — Progress Notes (Signed)
[address] [place], [place]  [address] Phone: [phone] Fax:  [phone]  Date:  01/08/2014   ID:  Joseph Carr, DOB [birth year], MRN [id number]  PCP:  Orland PenmanJARALLA SHAMLEFFER, IBTEHAL, MD  Cardiologist:  Armanda Magicraci Turner, MD    History of Present Illness: Joseph Carr is a 43 y.o. male with a history of CAD, HTN, tobacco abuse, HL. He was admitted 9/15 with a non-STEMI. He underwent DES to the ramus intermediate. He recently had an episode of CP in cardiac rehab. 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 He had some tingling in his left hand fingertips. He also had some back pain associated. Denied associated dyspnea. He felt like his symptoms were similar to his prior angina. This was not as severe. He did not take NTG. Symptoms lasted about 1.5 hours. His chest pain was gone by the time he got to cardiac rehab. He has been otherwise active without chest pain. He goes to cardiac rehab regularly. He denies significant dyspnea, orthopnea, PND, edema. He denies syncope. He underwent stress myoview that did not show any ischemia. He denies any further CP, SOB, dizziness, palpitations or syncope.   Wt Readings from Last 3 Encounters:  01/08/14 166 lb (75.297 kg)  12/29/13 158 lb (71.668 kg)  12/19/13 158 lb (71.668 kg)     Past Medical History  Diagnosis Date  . Hypertension   . CAD (coronary artery disease)     a. s/p STEMI 10/2013 - DES to ramus intermedius. Patent LM, LAD, RCA, EF 55%.  Marland Kitchen. Hyperlipidemia LDL goal <70   . Low TSH level   . Tobacco abuse   . Hx of cardiovascular stress test     ETT/Lexiscan Myoview (11/15):  Inferior/inferolateral fixed defect c/w soft tissue attenuation and / or subendocardial scar No significant ischemia. Overall low to intermediate risk scan.LV Ejection Fraction: 51%.    Current Outpatient Prescriptions  Medication Sig Dispense  Refill  . acetaminophen (TYLENOL) 325 MG tablet Take 2 tablets (650 mg total) by mouth every 4 (four) hours as needed for headache or mild pain.    Marland Kitchen. aspirin EC 81 MG tablet Take 81 mg by mouth daily.    Marland Kitchen. atorvastatin (LIPITOR) 80 MG tablet Take 1 tablet (80 mg total) by mouth daily at 6 PM. 30 tablet 6  . lisinopril-hydrochlorothiazide (PRINZIDE,ZESTORETIC) 20-25 MG per tablet Take 1 tablet by mouth daily. 30 tablet 2  . metoprolol tartrate (LOPRESSOR) 25 MG tablet Take 1 tablet (25 mg total) by mouth 2 (two) times daily. 60 tablet 6  . nitroGLYCERIN (NITROSTAT) 0.4 MG SL tablet Place 1 tablet (0.4 mg total) under the tongue every 5 (five) minutes x 3 doses as needed for chest pain. 25 tablet 4  . ticagrelor (BRILINTA) 90 MG TABS tablet Take 1 tablet (90 mg total) by mouth 2 (two) times daily. This is for regular prescription. 60 tablet 11   No current facility-administered medications for this visit.    Allergies:   No Known Allergies  Social History:  The patient  reports that he quit smoking about 1 months ago. His smoking use included Cigarettes. He has a 22.5 pack-year smoking history. He has never used smokeless tobacco. He reports that he drinks alcohol. He reports that he does not use illicit drugs.   Family History:  The patient's family history includes Heart attack in his father; Hypertension  in his father; Stroke in his father.   ROS:  Please see the history of present illness.      All other systems reviewed and negative.   PHYSICAL EXAM: VS:  BP 124/78 mmHg  Pulse 89  Ht 5\' 9"  (1.753 Carr)  Wt 166 lb (75.297 kg)  BMI 24.50 kg/m2  SpO2 97% Well nourished, well developed, in no acute distress HEENT: normal Neck: no JVD Cardiac:  normal S1, S2; RRR; no murmur Lungs:  clear to auscultation bilaterally, no wheezing, rhonchi or rales Abd: soft, nontender, no hepatomegaly Ext: no edema Skin: warm and dry Neuro:  CNs 2-12 intact, no focal abnormalities noted     ASSESSMENT  AND PLAN:  1. Chest pain, unspecified chest pain type - now resolved:stress myoview with no ischemia. 2. Coronary artery disease: S/P PCI of RI - Continue aspirin, Brilinta, statin, beta blocker. 3. Essential hypertension: Controlled. Continue BB/ACE I/ diuretic 4. HLD (hyperlipidemia): Continue statin. His last LDL was not at goal and Zetia added. Will repeat FLP and ALT in 8 weeks. 5. Former Cigarette Smoker: He has stopped smoking.   Followup with me in 6 months    Signed, Armanda Magicraci Turner, MD [facility] 01/08/2014 11:33 AM

## 2014-01-09 ENCOUNTER — Encounter (HOSPITAL_COMMUNITY): Payer: BC Managed Care – PPO

## 2014-01-09 ENCOUNTER — Encounter (HOSPITAL_COMMUNITY): Payer: BC Managed Care – PPO | Attending: Cardiology

## 2014-01-09 DIAGNOSIS — Z8249 Family history of ischemic heart disease and other diseases of the circulatory system: Secondary | ICD-10-CM | POA: Insufficient documentation

## 2014-01-09 DIAGNOSIS — Z5189 Encounter for other specified aftercare: Secondary | ICD-10-CM | POA: Insufficient documentation

## 2014-01-09 DIAGNOSIS — Z823 Family history of stroke: Secondary | ICD-10-CM | POA: Insufficient documentation

## 2014-01-09 DIAGNOSIS — I252 Old myocardial infarction: Secondary | ICD-10-CM | POA: Insufficient documentation

## 2014-01-09 DIAGNOSIS — Z955 Presence of coronary angioplasty implant and graft: Secondary | ICD-10-CM | POA: Insufficient documentation

## 2014-01-09 DIAGNOSIS — F17200 Nicotine dependence, unspecified, uncomplicated: Secondary | ICD-10-CM | POA: Insufficient documentation

## 2014-01-09 DIAGNOSIS — I10 Essential (primary) hypertension: Secondary | ICD-10-CM | POA: Insufficient documentation

## 2014-01-09 DIAGNOSIS — I2510 Atherosclerotic heart disease of native coronary artery without angina pectoris: Secondary | ICD-10-CM | POA: Insufficient documentation

## 2014-01-09 DIAGNOSIS — E785 Hyperlipidemia, unspecified: Secondary | ICD-10-CM | POA: Insufficient documentation

## 2014-01-12 ENCOUNTER — Encounter (HOSPITAL_COMMUNITY): Payer: BC Managed Care – PPO

## 2014-01-14 ENCOUNTER — Encounter (HOSPITAL_COMMUNITY): Payer: BC Managed Care – PPO

## 2014-01-15 ENCOUNTER — Encounter (HOSPITAL_COMMUNITY): Payer: Self-pay | Admitting: Interventional Cardiology

## 2014-01-16 ENCOUNTER — Encounter (HOSPITAL_COMMUNITY): Payer: BC Managed Care – PPO

## 2014-01-19 ENCOUNTER — Encounter (HOSPITAL_COMMUNITY): Payer: BC Managed Care – PPO

## 2014-01-20 ENCOUNTER — Telehealth (HOSPITAL_COMMUNITY): Payer: Self-pay | Admitting: *Deleted

## 2014-01-21 ENCOUNTER — Encounter (HOSPITAL_COMMUNITY): Payer: BC Managed Care – PPO

## 2014-01-23 ENCOUNTER — Encounter (HOSPITAL_COMMUNITY): Payer: BC Managed Care – PPO

## 2014-01-26 ENCOUNTER — Encounter (HOSPITAL_COMMUNITY): Payer: BC Managed Care – PPO

## 2014-01-28 ENCOUNTER — Encounter (HOSPITAL_COMMUNITY): Payer: BC Managed Care – PPO

## 2014-01-30 ENCOUNTER — Encounter (HOSPITAL_COMMUNITY): Payer: BC Managed Care – PPO

## 2014-02-02 ENCOUNTER — Encounter (HOSPITAL_COMMUNITY): Payer: BC Managed Care – PPO

## 2014-02-04 ENCOUNTER — Encounter (HOSPITAL_COMMUNITY): Payer: BC Managed Care – PPO

## 2014-02-06 ENCOUNTER — Encounter (HOSPITAL_COMMUNITY): Payer: BC Managed Care – PPO

## 2014-02-09 ENCOUNTER — Encounter (HOSPITAL_COMMUNITY): Payer: BC Managed Care – PPO

## 2014-02-10 ENCOUNTER — Encounter: Payer: Self-pay | Admitting: Cardiology

## 2014-02-11 ENCOUNTER — Emergency Department (HOSPITAL_COMMUNITY)
Admission: EM | Admit: 2014-02-11 | Discharge: 2014-02-11 | Disposition: A | Discharge status: Home / Self Care | Payer: BLUE CROSS/BLUE SHIELD | Attending: Emergency Medicine | Admitting: Emergency Medicine

## 2014-02-11 ENCOUNTER — Emergency Department (HOSPITAL_COMMUNITY): Payer: BLUE CROSS/BLUE SHIELD

## 2014-02-11 ENCOUNTER — Encounter (HOSPITAL_COMMUNITY): Payer: BC Managed Care – PPO

## 2014-02-11 ENCOUNTER — Encounter (HOSPITAL_COMMUNITY): Payer: Self-pay | Admitting: *Deleted

## 2014-02-11 DIAGNOSIS — I10 Essential (primary) hypertension: Secondary | ICD-10-CM | POA: Diagnosis not present

## 2014-02-11 DIAGNOSIS — M62838 Other muscle spasm: Secondary | ICD-10-CM | POA: Diagnosis not present

## 2014-02-11 DIAGNOSIS — Z79899 Other long term (current) drug therapy: Secondary | ICD-10-CM | POA: Insufficient documentation

## 2014-02-11 DIAGNOSIS — E785 Hyperlipidemia, unspecified: Secondary | ICD-10-CM | POA: Insufficient documentation

## 2014-02-11 DIAGNOSIS — I2510 Atherosclerotic heart disease of native coronary artery without angina pectoris: Secondary | ICD-10-CM | POA: Diagnosis not present

## 2014-02-11 DIAGNOSIS — Z87891 Personal history of nicotine dependence: Secondary | ICD-10-CM | POA: Insufficient documentation

## 2014-02-11 DIAGNOSIS — M436 Torticollis: Secondary | ICD-10-CM | POA: Diagnosis not present

## 2014-02-11 DIAGNOSIS — Z7982 Long term (current) use of aspirin: Secondary | ICD-10-CM | POA: Diagnosis not present

## 2014-02-11 DIAGNOSIS — M542 Cervicalgia: Secondary | ICD-10-CM | POA: Diagnosis present

## 2014-02-11 DIAGNOSIS — Z9889 Other specified postprocedural states: Secondary | ICD-10-CM | POA: Insufficient documentation

## 2014-02-11 LAB — I-STAT CHEM 8, ED
BUN: 14 mg/dL (ref 6–23)
Calcium, Ion: 1.12 mmol/L (ref 1.12–1.23)
Chloride: 104 mEq/L (ref 96–112)
Creatinine, Ser: 0.9 mg/dL (ref 0.50–1.35)
Glucose, Bld: 93 mg/dL (ref 70–99)
HEMATOCRIT: 44 % (ref 39.0–52.0)
HEMOGLOBIN: 15 g/dL (ref 13.0–17.0)
POTASSIUM: 5.7 mmol/L — AB (ref 3.5–5.1)
Sodium: 138 mmol/L (ref 135–145)
TCO2: 24 mmol/L (ref 0–100)

## 2014-02-11 LAB — CBC WITH DIFFERENTIAL/PLATELET
BASOS ABS: 0 10*3/uL (ref 0.0–0.1)
Basophils Relative: 0 % (ref 0–1)
EOS ABS: 0.3 10*3/uL (ref 0.0–0.7)
Eosinophils Relative: 5 % (ref 0–5)
HCT: 39.5 % (ref 39.0–52.0)
HEMOGLOBIN: 13.3 g/dL (ref 13.0–17.0)
Lymphocytes Relative: 25 % (ref 12–46)
Lymphs Abs: 1.7 10*3/uL (ref 0.7–4.0)
MCH: 27.3 pg (ref 26.0–34.0)
MCHC: 33.7 g/dL (ref 30.0–36.0)
MCV: 81.1 fL (ref 78.0–100.0)
MONO ABS: 0.7 10*3/uL (ref 0.1–1.0)
MONOS PCT: 10 % (ref 3–12)
NEUTROS ABS: 4.1 10*3/uL (ref 1.7–7.7)
Neutrophils Relative %: 60 % (ref 43–77)
PLATELETS: 322 10*3/uL (ref 150–400)
RBC: 4.87 MIL/uL (ref 4.22–5.81)
RDW: 15.1 % (ref 11.5–15.5)
WBC: 6.9 10*3/uL (ref 4.0–10.5)

## 2014-02-11 LAB — SEDIMENTATION RATE: Sed Rate: 1 mm/hr (ref 0–16)

## 2014-02-11 LAB — I-STAT TROPONIN, ED: Troponin i, poc: 0 ng/mL (ref 0.00–0.08)

## 2014-02-11 LAB — POTASSIUM: Potassium: 3.7 mmol/L (ref 3.5–5.1)

## 2014-02-11 IMAGING — CT CT ANGIO HEAD
1 of 13 series · 1 of 33 positions shown · IV contrast (Iohexol (Omnipaque 350))
Comparison: None

CLINICAL DATA: Right-sided neck pain radiating to the head.
Duration of symptoms 2 days.

EXAM:
CT ANGIOGRAPHY HEAD AND NECK
TECHNIQUE: Multidetector CT imaging of the head and neck was performed using
the standard protocol during bolus administration of intravenous
contrast. Multiplanar CT image reconstructions and MIPs were
obtained to evaluate the vascular anatomy. Carotid stenosis
measurements (when applicable) are obtained utilizing NASCET
criteria, using the distal internal carotid diameter as the
denominator.
CONTRAST:  50mL OMNIPAQUE IOHEXOL 350 MG/ML SOLN

[Series 300: locator · axial · 0.45mm/px · 1 of 1 slices shown]
[im 1/1  soft-tissue]
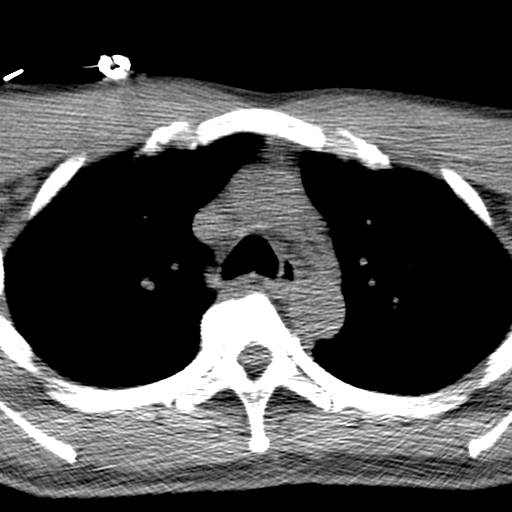

[1 of 33 positions shown; findings below may reference images not displayed]

FINDINGS: CT HEAD

Brain does not show accelerated atrophy. There is no evidence of old
or acute focal infarction, mass lesion, hemorrhage, hydrocephalus or
extra-axial collection. No calvarial abnormality. Sinuses show some
mucosal inflammatory change. Middle ears and mastoids are clear.

CTA NECK

The aortic arch appears normal. Branching pattern of the
brachiocephalic vessels from the arch is normal. No origin stenoses.
Both common carotid arteries are widely patent to their respective
bifurcation. No atherosclerotic change at either carotid
bifurcation. The cervical internal carotid arteries are tortuous but
widely patent an otherwise normal.

The right vertebral artery is widely patent at the origin and
through the cervical region. No vertebral dissection. Left vertebral
artery show 70% stenosis proximally. Beyond that, the vessel is
normal an widely patent through the cervical region.

No soft tissue lesion of the neck is identified. The left lobe of
the thyroid is diminutive compared to the right but there is no
focal lesions seen. Lung apices are clear. No superior mediastinal
pathology. The patient does have degenerative facet arthropathy at
C2-3, C3-4 and C4-5 that could contribute to neck pain.

CTA HEAD

Both internal carotid arteries are patent through the siphon region.
There is atherosclerotic disease in both carotid siphons with areas
of calcification and mild narrowing. No flow limiting stenosis is
seen. Both supra clinoid internal carotid arteries are widely
patent. The anterior and middle cerebral vessels are patent
bilaterally without proximal stenosis, aneurysm or vascular
malformation. Incidental fenestration of the left M1 segment.

Both vertebral arteries are patent to the basilar. No basilar
stenosis. Posterior circulation branch vessels are normal.

Venous sinuses are patent and normal.

Delayed scanning of the brain does not show any abnormal contrast
enhancement.
IMPRESSION: Head CT: Normal appearance of the brain. Inflammatory change of the
paranasal sinuses.

CTA of the neck: No carotid bifurcation disease. No internal carotid
artery dissection. 70% stenosis of the proximal left vertebral
artery. Vertebral arteries normal otherwise without evidence of
dissection.

CT of the head: Atherosclerotic change in the carotid siphon regions
without flow-limiting stenosis. No other intracranial pathologic
finding.

## 2014-02-11 MED ORDER — IBUPROFEN 800 MG PO TABS: 800.0000 mg | ORAL_TABLET | Freq: Three times a day (TID) | ORAL | Status: DC

## 2014-02-11 MED ORDER — MORPHINE SULFATE 4 MG/ML IJ SOLN
4.0000 mg | Freq: Once | INTRAMUSCULAR | Status: AC
  Administered 2014-02-11: 4 mg via INTRAVENOUS
  Filled 2014-02-11: qty 1

## 2014-02-11 MED ORDER — FENTANYL CITRATE 0.05 MG/ML IJ SOLN
50.0000 ug | Freq: Once | INTRAMUSCULAR | Status: AC
  Administered 2014-02-11: 50 ug via INTRAVENOUS
  Filled 2014-02-11: qty 2

## 2014-02-11 MED ORDER — IOHEXOL 350 MG/ML SOLN
50.0000 mL | Freq: Once | INTRAVENOUS | Status: AC | PRN
  Administered 2014-02-11: 50 mL via INTRAVENOUS

## 2014-02-11 MED ORDER — KETOROLAC TROMETHAMINE 30 MG/ML IJ SOLN
30.0000 mg | Freq: Once | INTRAMUSCULAR | Status: AC
  Administered 2014-02-11: 30 mg via INTRAVENOUS
  Filled 2014-02-11: qty 1

## 2014-02-11 MED ORDER — OXYCODONE-ACETAMINOPHEN 5-325 MG PO TABS: 2.0000 | ORAL_TABLET | ORAL | Status: DC | PRN

## 2014-02-11 MED ORDER — DIAZEPAM 2 MG PO TABS: 2.0000 mg | ORAL_TABLET | Freq: Two times a day (BID) | ORAL | Status: DC

## 2014-02-11 MED ORDER — DIAZEPAM 2 MG PO TABS
2.0000 mg | ORAL_TABLET | Freq: Once | ORAL | Status: AC
  Administered 2014-02-11: 2 mg via ORAL
  Filled 2014-02-11: qty 1

## 2014-02-11 NOTE — ED Notes (Signed)
Lab called about potassium. Redrawn blood sent. Will run.

## 2014-02-11 NOTE — ED Provider Notes (Signed)
CSN: [id number]     Arrival date & time 02/11/14  1012 History   First MD Initiated Contact with Patient 02/11/14 1026     Chief Complaint  Patient presents with  . Neck Pain  . Headache     (Consider location/radiation/quality/duration/timing/severity/associated sxs/prior Treatment) HPI Comments: Patient complains of 3 day history of right sided anterior lateral neck pain that radiates into his head at times. Denies any trauma. He woke from sleep with this pain 3 days ago and has been persistent and worsening. No radiation to his arms. No focal weakness, numbness or tingling. No chest pain or shortness of breath. No fever. No upper respiratory symptoms. No cough, runny nose or sore throat. No abdominal pain. She's been taking ibuprofen and using topical menthol without relief. Never had this pain like this in the past. No visual change.  The history is provided by the patient.    Past Medical History  Diagnosis Date  . Hypertension   . CAD (coronary artery disease)     a. s/p STEMI 10/2013 - DES to ramus intermedius. Patent LM, LAD, RCA, EF 55%.  Marland Kitchen Hyperlipidemia LDL goal <70   . Low TSH level   . Tobacco abuse   . Hx of cardiovascular stress test     ETT/Lexiscan Myoview (11/15):  Inferior/inferolateral fixed defect c/w soft tissue attenuation and / or subendocardial scar No significant ischemia. Overall low to intermediate risk scan.LV Ejection Fraction: 51%.   Past Surgical History  Procedure Laterality Date  . Finger fracture surgery Left 2014    4th finger  . Left heart catheterization with coronary angiogram N/A 10/20/2013    Procedure: LEFT HEART CATHETERIZATION WITH CORONARY ANGIOGRAM;  Surgeon: Jettie Booze, MD;  Location: [facility] CATH LAB;  Service: Cardiovascular;  Laterality: N/A;   Family History  Problem Relation Age of Onset  . Heart attack Father   . Hypertension Father   . Stroke Father    History  Substance Use Topics  . Smoking status: Former Smoker --  0.75 packs/day for 30 years    Types: Cigarettes    Quit date: 11/09/2013  . Smokeless tobacco: Never Used  . Alcohol Use: 0.0 oz/week    0 Not specified per week     Comment: 6-12 beers on Fridays    Review of Systems  Constitutional: Negative for fever, activity change and appetite change.  HENT: Negative for congestion, ear pain, sinus pressure, sneezing and sore throat.   Eyes: Negative for photophobia and visual disturbance.  Respiratory: Negative for cough, chest tightness and shortness of breath.   Cardiovascular: Negative for chest pain.  Gastrointestinal: Negative for nausea, vomiting and abdominal pain.  Musculoskeletal: Positive for neck pain. Negative for neck stiffness.  Skin: Negative for rash.  Neurological: Positive for headaches. Negative for weakness.  A complete 10 system review of systems was obtained and all systems are negative except as noted in the HPI and PMH.      Allergies  Review of patient's allergies indicates no known allergies.  Home Medications   Prior to Admission medications   Medication Sig Start Date End Date Taking? Authorizing Provider  aspirin EC 81 MG tablet Take 81 mg by mouth daily.   Yes Historical Provider, MD  atorvastatin (LIPITOR) 80 MG tablet Take 1 tablet (80 mg total) by mouth daily at 6 PM. 10/21/13  Yes Isaiah Serge, NP  ezetimibe (ZETIA) 10 MG tablet Take 1 tablet (10 mg total) by mouth daily. 01/08/14  Yes  Sueanne Margarita, MD  lisinopril-hydrochlorothiazide (PRINZIDE,ZESTORETIC) 20-25 MG per tablet Take 1 tablet by mouth daily. 08/23/12  Yes Harden Mo, MD  Menthol, Topical Analgesic, (BLUE GEL) 2 % GEL Apply 1 application topically daily as needed (pain).   Yes Historical Provider, MD  metoprolol tartrate (LOPRESSOR) 25 MG tablet Take 1 tablet (25 mg total) by mouth 2 (two) times daily. 10/21/13  Yes Isaiah Serge, NP  ticagrelor (BRILINTA) 90 MG TABS tablet Take 1 tablet (90 mg total) by mouth 2 (two) times daily. This is  for regular prescription. 10/21/13  Yes Isaiah Serge, NP  acetaminophen (TYLENOL) 325 MG tablet Take 2 tablets (650 mg total) by mouth every 4 (four) hours as needed for headache or mild pain. Patient not taking: Reported on 02/11/2014 10/21/13   Isaiah Serge, NP  diazepam (VALIUM) 2 MG tablet Take 1 tablet (2 mg total) by mouth 2 (two) times daily. 02/11/14   Ezequiel Essex, MD  ibuprofen (ADVIL,MOTRIN) 800 MG tablet Take 1 tablet (800 mg total) by mouth 3 (three) times daily. 02/11/14   Ezequiel Essex, MD  nitroGLYCERIN (NITROSTAT) 0.4 MG SL tablet Place 1 tablet (0.4 mg total) under the tongue every 5 (five) minutes x 3 doses as needed for chest pain. 10/21/13   Isaiah Serge, NP  oxyCODONE-acetaminophen (PERCOCET/ROXICET) 5-325 MG per tablet Take 2 tablets by mouth every 4 (four) hours as needed for severe pain. 02/11/14   Ezequiel Essex, MD   BP 125/68 mmHg  Pulse 79  Temp(Src) 98.5 F (36.9 C) (Oral)  Resp 15  SpO2 98% Physical Exam  Constitutional: He is oriented to person, place, and time. He appears well-developed and well-nourished. He appears distressed.  uncomfortable  HENT:  Head: Normocephalic and atraumatic.  Mouth/Throat: Oropharynx is clear and moist. No oropharyngeal exudate.  No temporal artery tenderness  Eyes: Conjunctivae and EOM are normal. Pupils are equal, round, and reactive to light.  Neck: Normal range of motion. Neck supple.  R lateral neck tenderness. No carotid bruit.  Reduced ROM 2/2 pain.  No midline tenderness  Cardiovascular: Normal rate, regular rhythm, normal heart sounds and intact distal pulses.   No murmur heard. Pulmonary/Chest: Effort normal and breath sounds normal. No respiratory distress.  Abdominal: Soft. There is no tenderness. There is no rebound and no guarding.  Musculoskeletal: Normal range of motion. He exhibits no edema or tenderness.  Neurological: He is alert and oriented to person, place, and time. No cranial nerve deficit. He exhibits  normal muscle tone. Coordination normal.  No ataxia on finger to nose bilaterally. No pronator drift. 5/5 strength throughout. CN 2-12 intact. Negative Romberg. Equal grip strength. Sensation intact. Gait is normal.   Skin: Skin is warm.  Psychiatric: He has a normal mood and affect. His behavior is normal.  Nursing note and vitals reviewed.   ED Course  Procedures (including critical care time) Labs Review Labs Reviewed  I-STAT CHEM 8, ED - Abnormal; Notable for the following:    Potassium 5.7 (*)    All other components within normal limits  CBC WITH DIFFERENTIAL  SEDIMENTATION RATE  POTASSIUM  I-STAT TROPOININ, ED    Imaging Review Ct Angio Head W/cm &/or Wo Cm  02/11/2014   CLINICAL DATA:  Right-sided neck pain radiating to the head. Duration of symptoms 2 days.  EXAM: CT ANGIOGRAPHY HEAD AND NECK  TECHNIQUE: Multidetector CT imaging of the head and neck was performed using the standard protocol during bolus administration of intravenous  contrast. Multiplanar CT image reconstructions and MIPs were obtained to evaluate the vascular anatomy. Carotid stenosis measurements (when applicable) are obtained utilizing NASCET criteria, using the distal internal carotid diameter as the denominator.  CONTRAST:  17mL OMNIPAQUE IOHEXOL 350 MG/ML SOLN  COMPARISON:  None  FINDINGS: CT HEAD  Brain does not show accelerated atrophy. There is no evidence of old or acute focal infarction, mass lesion, hemorrhage, hydrocephalus or extra-axial collection. No calvarial abnormality. Sinuses show some mucosal inflammatory change. Middle ears and mastoids are clear.  CTA NECK  The aortic arch appears normal. Branching pattern of the brachiocephalic vessels from the arch is normal. No origin stenoses. Both common carotid arteries are widely patent to their respective bifurcation. No atherosclerotic change at either carotid bifurcation. The cervical internal carotid arteries are tortuous but widely patent an otherwise  normal.  The right vertebral artery is widely patent at the origin and through the cervical region. No vertebral dissection. Left vertebral artery show 70% stenosis proximally. Beyond that, the vessel is normal an widely patent through the cervical region.  No soft tissue lesion of the neck is identified. The left lobe of the thyroid is diminutive compared to the right but there is no focal lesions seen. Lung apices are clear. No superior mediastinal pathology. The patient does have degenerative facet arthropathy at C2-3, C3-4 and C4-5 that could contribute to neck pain.  CTA HEAD  Both internal carotid arteries are patent through the siphon region. There is atherosclerotic disease in both carotid siphons with areas of calcification and mild narrowing. No flow limiting stenosis is seen. Both supra clinoid internal carotid arteries are widely patent. The anterior and middle cerebral vessels are patent bilaterally without proximal stenosis, aneurysm or vascular malformation. 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  Both vertebral arteries are patent to the basilar. No basilar stenosis. Posterior circulation branch vessels are normal.  Venous sinuses are patent and normal.  Delayed scanning of the brain does not show any abnormal contrast enhancement.  IMPRESSION: Head CT: Normal appearance of the brain. Inflammatory change of the paranasal sinuses.  CTA of the neck: No carotid bifurcation disease. No internal carotid artery dissection. 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 Vertebral arteries normal otherwise without evidence of dissection.  CT of the head: Atherosclerotic change in the carotid siphon regions without flow-limiting stenosis. No other intracranial pathologic finding.   Electronically Signed   By: Paulina Fusi M.D.   On: 02/11/2014 12:47   Ct Angio Neck W/cm &/or Wo/cm  02/11/2014   CLINICAL DATA:  Right-sided neck pain radiating to the head. Duration of symptoms 2 days.  EXAM: CT  ANGIOGRAPHY HEAD AND NECK  TECHNIQUE: Multidetector CT imaging of the head and neck was performed using the standard protocol during bolus administration of intravenous contrast. Multiplanar CT image reconstructions and MIPs were obtained to evaluate the vascular anatomy. Carotid stenosis measurements (when applicable) are obtained utilizing NASCET criteria, using the distal internal carotid diameter as the denominator.  CONTRAST:  68mL OMNIPAQUE IOHEXOL 350 MG/ML SOLN  COMPARISON:  None  FINDINGS: CT HEAD  Brain does not show accelerated atrophy. There is no evidence of old or acute focal infarction, mass lesion, hemorrhage, hydrocephalus or extra-axial collection. No calvarial abnormality. Sinuses show some mucosal inflammatory change. Middle ears and mastoids are clear.  CTA NECK  The aortic arch appears normal. Branching pattern of the brachiocephalic vessels from the arch is normal. No origin stenoses. Both common carotid arteries are widely patent to  their respective bifurcation. No atherosclerotic change at either carotid bifurcation. The cervical internal carotid arteries are tortuous but widely patent an otherwise normal.  The right vertebral artery is widely patent at the origin and through the cervical region. No vertebral dissection. Left vertebral artery show 70% stenosis proximally. Beyond that, the vessel is normal an widely patent through the cervical region.  No soft tissue lesion of the neck is identified. The left lobe of the thyroid is diminutive compared to the right but there is no focal lesions seen. Lung apices are clear. No superior mediastinal pathology. The patient does have degenerative facet arthropathy at C2-3, C3-4 and C4-5 that could contribute to neck pain.  CTA HEAD  Both internal carotid arteries are patent through the siphon region. There is atherosclerotic disease in both carotid siphons with areas of calcification and mild narrowing. No flow limiting stenosis is seen. Both supra  clinoid internal carotid arteries are widely patent. The anterior and middle cerebral vessels are patent bilaterally without proximal stenosis, aneurysm or vascular malformation. 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  Both vertebral arteries are patent to the basilar. No basilar stenosis. Posterior circulation branch vessels are normal.  Venous sinuses are patent and normal.  Delayed scanning of the brain does not show any abnormal contrast enhancement.  IMPRESSION: Head CT: Normal appearance of the brain. Inflammatory change of the paranasal sinuses.  CTA of the neck: No carotid bifurcation disease. No internal carotid artery dissection. 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 Vertebral arteries normal otherwise without evidence of dissection.  CT of the head: Atherosclerotic change in the carotid siphon regions without flow-limiting stenosis. No other intracranial pathologic finding.   Electronically Signed   By: Nelson Chimes M.D.   On: 02/11/2014 12:47     EKG Interpretation   Date/Time:  Wednesday February 11 2014 11:17:41 EST Ventricular Rate:  77 PR Interval:  184 QRS Duration: 78 QT Interval:  358 QTC Calculation: 405 R Axis:   58 Text Interpretation:  Sinus rhythm Probable left atrial enlargement  Probable left ventricular hypertrophy Baseline wander in lead(s) V2 V5 No  significant change was found Confirmed by Wyvonnia Dusky  MD,  ([id number]) on  02/11/2014 11:22:36 AM      MDM   Final diagnoses:  Cervical paraspinal muscle spasm  Torticollis   3 days of atraumatic neck pain that radiates to his head intermittently. No weakness, numbness or tingling. No fever. No headache currently No neuro deficits. ESR normal.  Doubt temporal arteritis. No vision changes  CT head negative. CTA shows no carotid dissection. 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 This was discussed with Etta Quill of neurology who agrees no treatment needed at this time. Continue baby  aspirin.  Potassium 3.7 recheck. Patient improved after medications in the ED. Suspect muscle spasm and torticollis. No evidence of meningitis or subarachnoid hemorrhage.  We'll treat with anti-inflammatories, pain medication, muscle relaxers. Return precautions discussed. Ezequiel Essex, MD 02/11/14 1740

## 2014-02-11 NOTE — ED Notes (Signed)
MD Rancour at bedside 

## 2014-02-11 NOTE — Discharge Instructions (Signed)
Torticollis, Acute Take the medications as prescribed. Follow-up with your doctor. Do not take the pain medication or muscle relaxers if you're working or driving. Return to the ED if he develop new or worsening symptoms. You have suddenly (acutely) developed a twisted neck (torticollis). This is usually a self-limited condition. CAUSES  Acute torticollis may be caused by malposition, trauma or infection. Most commonly, acute torticollis is caused by sleeping in an awkward position. Torticollis may also be caused by the flexion, extension or twisting of the neck muscles beyond their normal position. Sometimes, the exact cause may not be known. SYMPTOMS  Usually, there is pain and limited movement of the neck. Your neck may twist to one side. DIAGNOSIS  The diagnosis is often made by physical examination. X-rays, CT scans or MRIs may be done if there is a history of trauma or concern of infection. TREATMENT  For a common, stiff neck that develops during sleep, treatment is focused on relaxing the contracted neck muscle. Medications (including shots) may be used to treat the problem. Most cases resolve in several days. Torticollis usually responds to conservative physical therapy. If left untreated, the shortened and spastic neck muscle can cause deformities in the face and neck. Rarely, surgery is required. HOME CARE INSTRUCTIONS   Use over-the-counter and prescription medications as directed by your caregiver.  Do stretching exercises and massage the neck as directed by your caregiver.  Follow up with physical therapy if needed and as directed by your caregiver. SEEK IMMEDIATE MEDICAL CARE IF:   You develop difficulty breathing or noisy breathing (stridor).  You drool, develop trouble swallowing or have pain with swallowing.  You develop numbness or weakness in the hands or feet.  You have changes in speech or vision.  You have problems with urination or bowel movements.  You have  difficulty walking.  You have a fever.  You have increased pain. MAKE SURE YOU:   Understand these instructions.  Will watch your condition.  Will get help right away if you are not doing well or get worse. Document Released: 01/21/2000 Document Revised: 04/17/2011 Document Reviewed: 03/03/2009 [facility] Patient Information 2015 [employer] [place]. This information is not intended to replace advice given to you by your health care provider. Make sure you discuss any questions you have with your health care provider.

## 2014-02-11 NOTE — ED Notes (Signed)
PA Jennifer at bedside.  

## 2014-02-11 NOTE — ED Notes (Signed)
Pt in c/o right sided neck pain that radiates into the right side of his head, denies injury, pain worse with movement, no distress noted

## 2014-02-13 ENCOUNTER — Encounter (HOSPITAL_COMMUNITY): Payer: BC Managed Care – PPO

## 2014-02-18 ENCOUNTER — Encounter (HOSPITAL_COMMUNITY): Payer: BC Managed Care – PPO

## 2014-02-20 ENCOUNTER — Encounter (HOSPITAL_COMMUNITY): Payer: BC Managed Care – PPO

## 2014-02-25 ENCOUNTER — Encounter (HOSPITAL_COMMUNITY): Payer: BC Managed Care – PPO

## 2014-02-27 ENCOUNTER — Encounter (HOSPITAL_COMMUNITY): Payer: BC Managed Care – PPO

## 2014-03-04 ENCOUNTER — Encounter (HOSPITAL_COMMUNITY): Payer: BC Managed Care – PPO

## 2014-03-05 ENCOUNTER — Other Ambulatory Visit (INDEPENDENT_AMBULATORY_CARE_PROVIDER_SITE_OTHER): Payer: BLUE CROSS/BLUE SHIELD | Admitting: *Deleted

## 2014-03-05 DIAGNOSIS — E785 Hyperlipidemia, unspecified: Secondary | ICD-10-CM

## 2014-03-05 LAB — ALT: ALT: 21 U/L (ref 0–53)

## 2014-03-05 LAB — LIPID PANEL
Cholesterol: 146 mg/dL (ref 0–200)
HDL: 44.4 mg/dL (ref >39.00)
LDL CALC: 72 mg/dL (ref 0–99)
NONHDL: 101.6
Total CHOL/HDL Ratio: 3
Triglycerides: 148 mg/dL (ref 0.0–149.0)
VLDL: 29.6 mg/dL (ref 0.0–40.0)

## 2014-03-06 ENCOUNTER — Encounter (HOSPITAL_COMMUNITY): Payer: BC Managed Care – PPO

## 2014-03-10 ENCOUNTER — Telehealth: Payer: Self-pay | Admitting: Cardiology

## 2014-03-10 NOTE — Telephone Encounter (Signed)
Left message to call back  

## 2014-03-10 NOTE — Telephone Encounter (Signed)
New Msg        Pt states he is returning a call from yesterday. Please return pt to [phone].

## 2014-03-10 NOTE — Telephone Encounter (Signed)
Spoke with pt and informed him of lab results.   Informed pt that lipids are at goal and to continue current therapy. Pt verbalized understanding and agreed with plan.

## 2014-03-10 NOTE — Telephone Encounter (Signed)
F/u ° ° °Pt returning your call °

## 2014-03-11 ENCOUNTER — Encounter (HOSPITAL_COMMUNITY): Payer: BC Managed Care – PPO

## 2014-03-13 ENCOUNTER — Encounter (HOSPITAL_COMMUNITY): Payer: BC Managed Care – PPO

## 2014-04-01 ENCOUNTER — Encounter: Payer: Self-pay | Admitting: Cardiology

## 2014-04-04 ENCOUNTER — Emergency Department (HOSPITAL_COMMUNITY)
Admission: EM | Admit: 2014-04-04 | Discharge: 2014-04-04 | Disposition: A | Discharge status: Home / Self Care | Payer: BLUE CROSS/BLUE SHIELD | Attending: Emergency Medicine | Admitting: Emergency Medicine

## 2014-04-04 ENCOUNTER — Encounter (HOSPITAL_COMMUNITY): Payer: Self-pay | Admitting: Emergency Medicine

## 2014-04-04 DIAGNOSIS — I252 Old myocardial infarction: Secondary | ICD-10-CM | POA: Insufficient documentation

## 2014-04-04 DIAGNOSIS — Z7982 Long term (current) use of aspirin: Secondary | ICD-10-CM | POA: Insufficient documentation

## 2014-04-04 DIAGNOSIS — Y9389 Activity, other specified: Secondary | ICD-10-CM | POA: Insufficient documentation

## 2014-04-04 DIAGNOSIS — Y998 Other external cause status: Secondary | ICD-10-CM | POA: Diagnosis not present

## 2014-04-04 DIAGNOSIS — I2510 Atherosclerotic heart disease of native coronary artery without angina pectoris: Secondary | ICD-10-CM | POA: Insufficient documentation

## 2014-04-04 DIAGNOSIS — X58XXXA Exposure to other specified factors, initial encounter: Secondary | ICD-10-CM | POA: Insufficient documentation

## 2014-04-04 DIAGNOSIS — I10 Essential (primary) hypertension: Secondary | ICD-10-CM | POA: Insufficient documentation

## 2014-04-04 DIAGNOSIS — E785 Hyperlipidemia, unspecified: Secondary | ICD-10-CM | POA: Diagnosis not present

## 2014-04-04 DIAGNOSIS — Z720 Tobacco use: Secondary | ICD-10-CM | POA: Insufficient documentation

## 2014-04-04 DIAGNOSIS — S0591XA Unspecified injury of right eye and orbit, initial encounter: Secondary | ICD-10-CM | POA: Diagnosis present

## 2014-04-04 DIAGNOSIS — Z791 Long term (current) use of non-steroidal anti-inflammatories (NSAID): Secondary | ICD-10-CM | POA: Diagnosis not present

## 2014-04-04 DIAGNOSIS — Y92410 Unspecified street and highway as the place of occurrence of the external cause: Secondary | ICD-10-CM | POA: Insufficient documentation

## 2014-04-04 DIAGNOSIS — H109 Unspecified conjunctivitis: Secondary | ICD-10-CM | POA: Diagnosis not present

## 2014-04-04 DIAGNOSIS — Z79899 Other long term (current) drug therapy: Secondary | ICD-10-CM | POA: Diagnosis not present

## 2014-04-04 DIAGNOSIS — Z9889 Other specified postprocedural states: Secondary | ICD-10-CM | POA: Insufficient documentation

## 2014-04-04 DIAGNOSIS — S0501XA Injury of conjunctiva and corneal abrasion without foreign body, right eye, initial encounter: Secondary | ICD-10-CM | POA: Insufficient documentation

## 2014-04-04 MED ORDER — PROPARACAINE HCL 0.5 % OP SOLN
1.0000 [drp] | Freq: Once | OPHTHALMIC | Status: AC
  Administered 2014-04-04: 1 [drp] via OPHTHALMIC
  Filled 2014-04-04: qty 15

## 2014-04-04 MED ORDER — FLUORESCEIN SODIUM 1 MG OP STRP
1.0000 | ORAL_STRIP | Freq: Once | OPHTHALMIC | Status: AC
  Administered 2014-04-04: 1 via OPHTHALMIC
  Filled 2014-04-04: qty 1

## 2014-04-04 MED ORDER — ERYTHROMYCIN 5 MG/GM OP OINT: 1.0000 "application " | TOPICAL_OINTMENT | Freq: Four times a day (QID) | OPHTHALMIC | Status: DC

## 2014-04-04 NOTE — ED Notes (Signed)
Pt c/o unable to see out of right eye since Friday. Pt reports pain to right eye onset Thursday. Pt reports drainage to right eye.

## 2014-04-04 NOTE — ED Notes (Signed)
Pt reports swelling and pain to R eye. Ongoing for several days. Swelling and redness noted to R eye upon arrival to ED. Pupils 3mm, round and reactive. Also states blurred vision to R side.

## 2014-04-04 NOTE — ED Notes (Signed)
Pt in fast track getting eyes examined by PA

## 2014-04-04 NOTE — ED Provider Notes (Signed)
CSN: [id number]     Arrival date & time 04/04/14  [id number] History   First MD Initiated Contact with Patient 04/04/14 [phone]     Chief Complaint  Patient presents with  . Eye Problem     (Consider location/radiation/quality/duration/timing/severity/associated sxs/prior Treatment) HPI Is a 44 year old male who presents emergency Department with chief complaint of left eye blurriness and orbital swelling. He has a past medical history of previous MI, coronary artery disease, hyperlipidemia, tobacco abuse, hypertension. Patient states that Thursday (2 days ago) he began having pain in the right lateral canthus. He also had sensations of "stabbing pain" in the eye. The patient states that yesterday morning he awoke, was periorbital swelling. He acknowledges photosensitivity, blurred vision, but denies injury to the eye, sensation of foreign body, history of glaucoma, severe headache, eye trauma, facial rash or history of facial herpetic infection.  Patient complains of eye mattering this morning. He denies contacts with similar symptoms.   Past Medical History  Diagnosis Date  . Hypertension   . CAD (coronary artery disease)     a. s/p STEMI 10/2013 - DES to ramus intermedius. Patent LM, LAD, RCA, EF 55%.  Marland Kitchen. Hyperlipidemia LDL goal <70   . Low TSH level   . Tobacco abuse   . Hx of cardiovascular stress test     ETT/Lexiscan Myoview (11/15):  Inferior/inferolateral fixed defect c/w soft tissue attenuation and / or subendocardial scar No significant ischemia. Overall low to intermediate risk scan.LV Ejection Fraction: 51%.   Past Surgical History  Procedure Laterality Date  . Finger fracture surgery Left 2014    4th finger  . Left heart catheterization with coronary angiogram N/A 10/20/2013    Procedure: LEFT HEART CATHETERIZATION WITH CORONARY ANGIOGRAM;  Surgeon: Corky CraftsJayadeep S Varanasi, MD;  [facility];  Service: Cardiovascular;  Laterality: N/A;   Family History  Problem Relation Age  of Onset  . Heart attack Father   . Hypertension Father   . Stroke Father    History  Substance Use Topics  . Smoking status: Current Some Day Smoker -- 0.75 packs/day for 30 years    Types: Cigarettes    Last Attempt to Quit: 11/09/2013  . Smokeless tobacco: Never Used  . Alcohol Use: 0.0 oz/week    0 Standard drinks or equivalent per week     Comment: 6-12 beers on Fridays    Review of Systems  Ten systems reviewed and are negative for acute change, except as noted in the HPI.    Allergies  Review of patient's allergies indicates no known allergies.  Home Medications   Prior to Admission medications   Medication Sig Start Date End Date Taking? Authorizing Provider  acetaminophen (TYLENOL) 325 MG tablet Take 2 tablets (650 mg total) by mouth every 4 (four) hours as needed for headache or mild pain. Patient not taking: Reported on 02/11/2014 10/21/13   Leone BrandLaura R Ingold, NP  aspirin EC 81 MG tablet Take 81 mg by mouth daily.    Historical Provider, MD  atorvastatin (LIPITOR) 80 MG tablet Take 1 tablet (80 mg total) by mouth daily at 6 PM. 10/21/13   Leone BrandLaura R Ingold, NP  diazepam (VALIUM) 2 MG tablet Take 1 tablet (2 mg total) by mouth 2 (two) times daily. 02/11/14   Glynn OctaveStephen Rancour, MD  ezetimibe (ZETIA) 10 MG tablet Take 1 tablet (10 mg total) by mouth daily. 01/08/14   Quintella Reichertraci R Turner, MD  ibuprofen (ADVIL,MOTRIN) 800 MG tablet Take 1 tablet (800 mg total)  by mouth 3 (three) times daily. 02/11/14   Glynn Octave, MD  lisinopril-hydrochlorothiazide (PRINZIDE,ZESTORETIC) 20-25 MG per tablet Take 1 tablet by mouth daily. 08/23/12   Reuben Likes, MD  Menthol, Topical Analgesic, (BLUE GEL) 2 % GEL Apply 1 application topically daily as needed (pain).    Historical Provider, MD  metoprolol tartrate (LOPRESSOR) 25 MG tablet Take 1 tablet (25 mg total) by mouth 2 (two) times daily. 10/21/13   Leone Brand, NP  nitroGLYCERIN (NITROSTAT) 0.4 MG SL tablet Place 1 tablet (0.4 mg total) under the  tongue every 5 (five) minutes x 3 doses as needed for chest pain. 10/21/13   Leone Brand, NP  oxyCODONE-acetaminophen (PERCOCET/ROXICET) 5-325 MG per tablet Take 2 tablets by mouth every 4 (four) hours as needed for severe pain. 02/11/14   Glynn Octave, MD  ticagrelor (BRILINTA) 90 MG TABS tablet Take 1 tablet (90 mg total) by mouth 2 (two) times daily. This is for regular prescription. 10/21/13   Leone Brand, NP   BP 146/102 mmHg  Pulse 82  Temp(Src) 98.3 F (36.8 C) (Oral)  Resp 18  Ht  (1.753 m)  Wt 169 lb (76.658 kg)  BMI 24.95 kg/m2  SpO2 100% Physical Exam  Constitutional: He appears well-developed and well-nourished. No distress.  HENT:  Head: Normocephalic and atraumatic.  Eyes: EOM are normal. Pupils are equal, round, and reactive to light. Lids are everted and swept, no foreign bodies found. Right conjunctiva is injected. No scleral icterus.  Slit lamp exam:      The right eye shows corneal abrasion and fluorescein uptake. The right eye shows no corneal flare, no corneal ulcer and no foreign body.    Pressure OD: 14 OS: 19   Bilateral Distance: 20/40 ; R Distance: 20/70 ; L Distance: 20/40  Neck: Normal range of motion. Neck supple.  Cardiovascular: Normal rate, regular rhythm and normal heart sounds.   Pulmonary/Chest: Effort normal and breath sounds normal. No respiratory distress.  Abdominal: Soft. There is no tenderness.  Musculoskeletal: He exhibits no edema.  Neurological: He is alert.  Skin: Skin is warm and dry. He is not diaphoretic.  Psychiatric: His behavior is normal.  Nursing note and vitals reviewed.   ED Course  Procedures (including critical care time) Labs Review Labs Reviewed - No data to display  Imaging Review No results found.   EKG Interpretation None      MDM   Final diagnoses:  Corneal abrasion, right, initial encounter  Conjunctivitis of right eye    Corneal abrasion/ conjunctivitis  Pt with corneal abrasion on  PE. Marland Kitchen Eye irrigated w NS, no evidence of FB. Vision somewhat blurry on right with slight difference on  Acuity exam. I am not concerned for significant eye pathology or neurologic cause. Pt is not a contact lens wearer.  Exam non-concerning for orbital cellulitis, hyphema, corneal ulcers. Patient will be discharged home with erythromycin.   Patient understands to follow up with ophthalmology, & to return to ER if new symptoms develop including change in vision, purulent drainage, or entrapment.      Arthor Captain, PA-C 04/04/14 1133  Arby Barrette, MD 04/08/14 [phone]

## 2014-04-04 NOTE — ED Notes (Signed)
Pt returned to room. Pt ambulatory.

## 2014-04-04 NOTE — Discharge Instructions (Signed)
Corneal Abrasion  The cornea is the clear covering at the front and center of the eye. When looking at the colored portion of the eye (iris), you are looking through the cornea. This very thin tissue is made up of many layers. The surface layer is a single layer of cells (corneal epithelium) and is one of the most sensitive tissues in the body. If a scratch or injury causes the corneal epithelium to come off, it is called a corneal abrasion. If the injury extends to the tissues below the epithelium, the condition is called a corneal ulcer.  CAUSES    Scratches.   Trauma.   Foreign body in the eye.  Some people have recurrences of abrasions in the area of the original injury even after it has healed (recurrent erosion syndrome). Recurrent erosion syndrome generally improves and goes away with time.  SYMPTOMS    Eye pain.   Difficulty or inability to keep the injured eye open.   The eye becomes very sensitive to light.   Recurrent erosions tend to happen suddenly, first thing in the morning, usually after waking up and opening the eye.  DIAGNOSIS   Your health care provider can diagnose a corneal abrasion during an eye exam. Dye is usually placed in the eye using a drop or a small paper strip moistened by your tears. When the eye is examined with a special light, the abrasion shows up clearly because of the dye.  TREATMENT    Small abrasions may be treated with antibiotic drops or ointment alone.   A pressure patch may be put over the eye. If this is done, follow your doctor's instructions for when to remove the patch. Do not drive or use machines while the eye patch is on. Judging distances is hard to do with a patch on.  If the abrasion becomes infected and spreads to the deeper tissues of the cornea, a corneal ulcer can result. This is serious because it can cause corneal scarring. Corneal scars interfere with light passing through the cornea and cause a loss of vision in the involved eye.  HOME CARE  INSTRUCTIONS   Use medicine or ointment as directed. Only take over-the-counter or prescription medicines for pain, discomfort, or fever as directed by your health care provider.   Do not drive or operate machinery if your eye is patched. Your ability to judge distances is impaired.   If your health care provider has given you a follow-up appointment, it is very important to keep that appointment. Not keeping the appointment could result in a severe eye infection or permanent loss of vision. If there is any problem keeping the appointment, let your health care provider know.  SEEK MEDICAL CARE IF:    You have pain, light sensitivity, and a scratchy feeling in one eye or both eyes.   Your pressure patch keeps loosening up, and you can blink your eye under the patch after treatment.   Any kind of discharge develops from the eye after treatment or if the lids stick together in the morning.   You have the same symptoms in the morning as you did with the original abrasion days, weeks, or months after the abrasion healed.  MAKE SURE YOU:    Understand these instructions.   Will watch your condition.   Will get help right away if you are not doing well or get worse.  Document Released: 01/21/2000 Document Revised: 01/28/2013 Document Reviewed: 09/30/2012  ExitCare Patient Information 2015 [employer].   This information is not intended to replace advice given to you by your health care provider. Make sure you discuss any questions you have with your health care provider.  Conjunctivitis  Conjunctivitis is commonly called "pink eye." Conjunctivitis can be caused by bacterial or viral infection, allergies, or injuries. There is usually redness of the lining of the eye, itching, discomfort, and sometimes discharge. There may be deposits of matter along the eyelids. A viral infection usually causes a watery discharge, while a bacterial infection causes a yellowish, thick discharge. Pink eye is very contagious and  spreads by direct contact.  You may be given antibiotic eyedrops as part of your treatment. Before using your eye medicine, remove all drainage from the eye by washing gently with warm water and cotton balls. Continue to use the medication until you have awakened 2 mornings in a row without discharge from the eye. Do not rub your eye. This increases the irritation and helps spread infection. Use separate towels from other household members. Wash your hands with soap and water before and after touching your eyes. Use cold compresses to reduce pain and sunglasses to relieve irritation from light. Do not wear contact lenses or wear eye makeup until the infection is gone.  SEEK MEDICAL CARE IF:    Your symptoms are not better after 3 days of treatment.   You have increased pain or trouble seeing.   The outer eyelids become very red or swollen.  Document Released: 03/02/2004 Document Revised: 04/17/2011 Document Reviewed: 01/23/2005  Exit[employer] Patient Information 2015 [employer]. This information is not intended to replace advice given to you by your health care provider. Make sure you discuss any questions you have with your health care provider.

## 2014-05-15 ENCOUNTER — Other Ambulatory Visit: Payer: Self-pay | Admitting: Internal Medicine

## 2014-05-15 DIAGNOSIS — R590 Localized enlarged lymph nodes: Secondary | ICD-10-CM

## 2014-05-21 ENCOUNTER — Other Ambulatory Visit: Payer: Self-pay | Admitting: Cardiology

## 2014-05-22 ENCOUNTER — Other Ambulatory Visit: Payer: Self-pay | Admitting: Internal Medicine

## 2014-05-22 ENCOUNTER — Ambulatory Visit
Admission: RE | Admit: 2014-05-22 | Discharge: 2014-05-22 | Disposition: A | Discharge status: Home / Self Care | Payer: BLUE CROSS/BLUE SHIELD | Source: Ambulatory Visit | Attending: Internal Medicine | Admitting: Internal Medicine

## 2014-05-22 DIAGNOSIS — R590 Localized enlarged lymph nodes: Secondary | ICD-10-CM

## 2014-05-22 IMAGING — US US EXTREM UP*L* LTD
1 series · 14 of 24 positions shown · non-contrast
Comparison: Prior ultrasounds from the [REDACTED] dated
[DATE] and [DATE].

CLINICAL DATA: 43-year-old male -followup bilateral axillary lymph
nodes.

EXAM:
ULTRASOUND BILATERAL UPPER EXTREMITY LIMITED
TECHNIQUE: Ultrasound examination of bilateral axillary regions was performed.

[Series 1: us extrem up*left* ltd · 0.08mm/px · 14 of 24 slices shown]
[im 1/24]
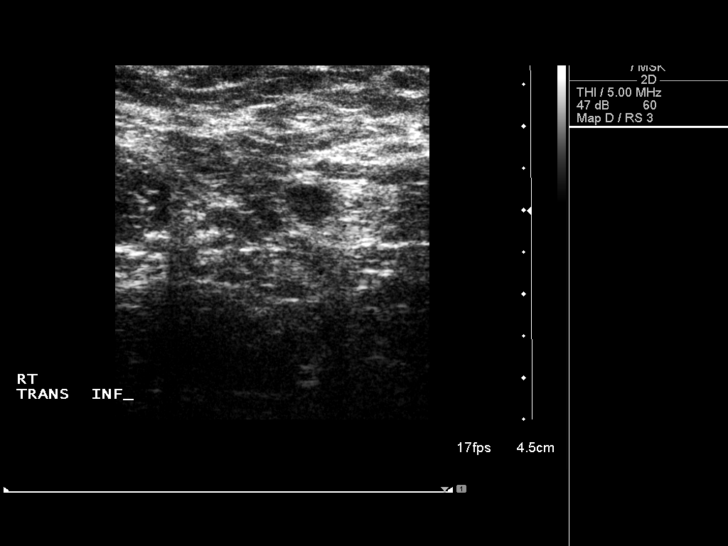
[im 3/24]
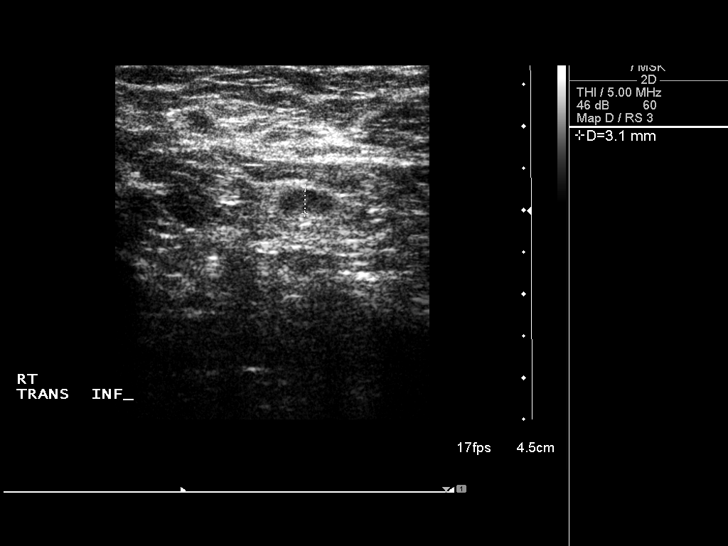
[im 5/24]
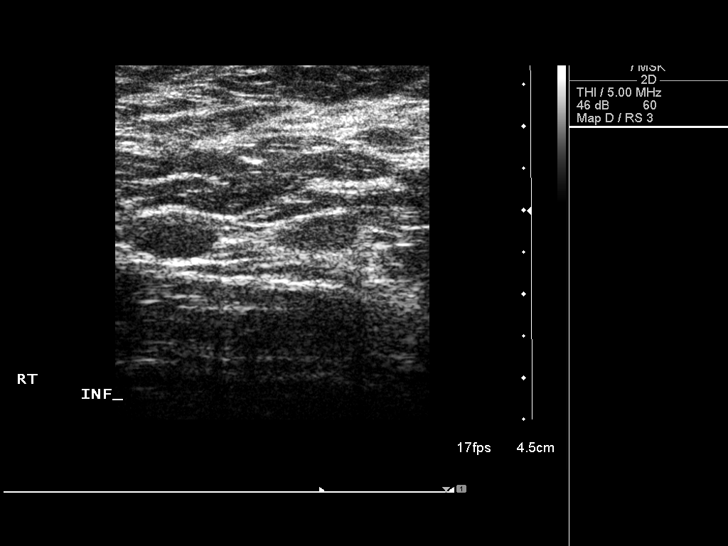
[im 7/24]
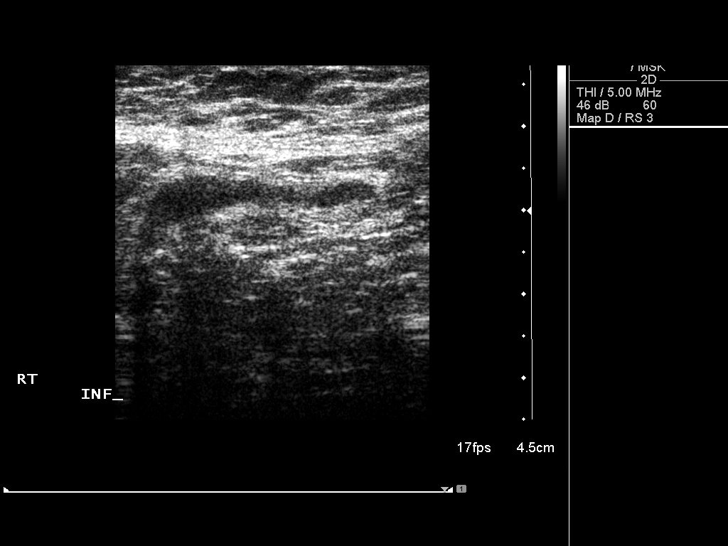
[im 8/24]
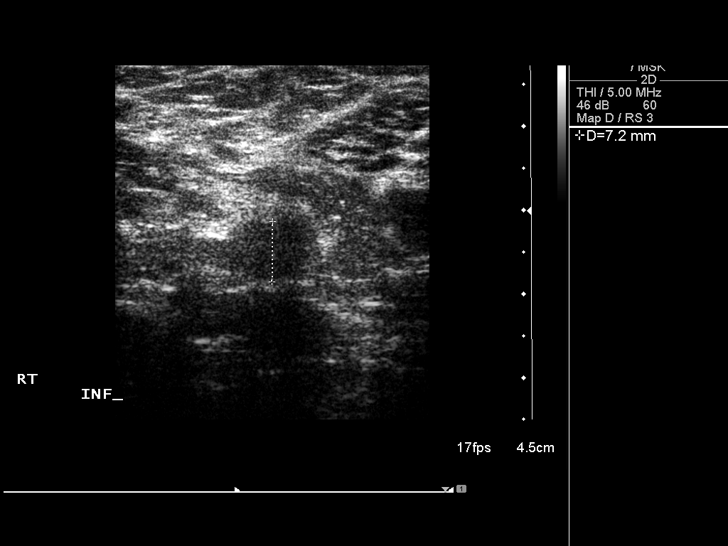
[im 10/24]
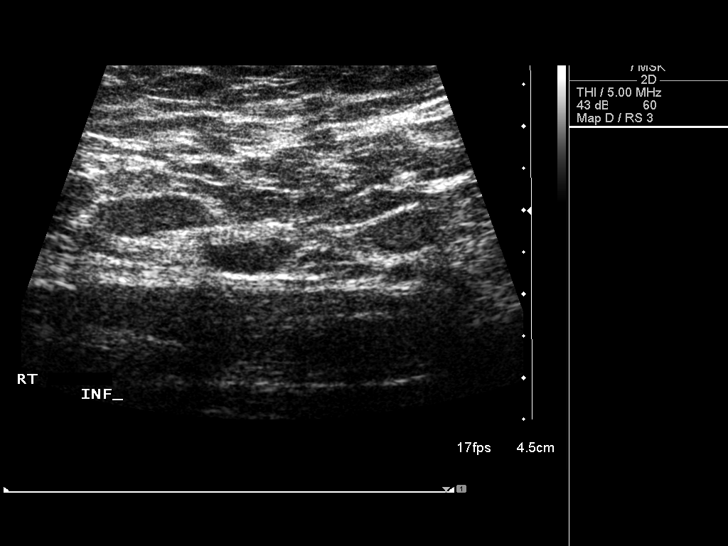
[im 12/24]
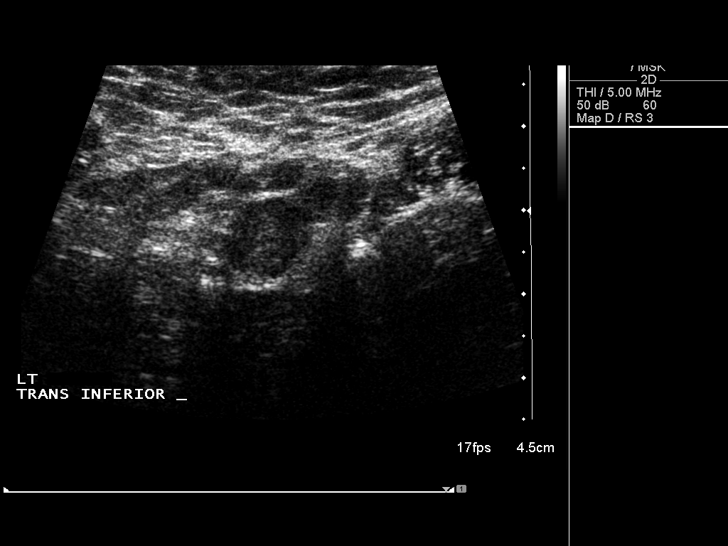
[im 13/24]
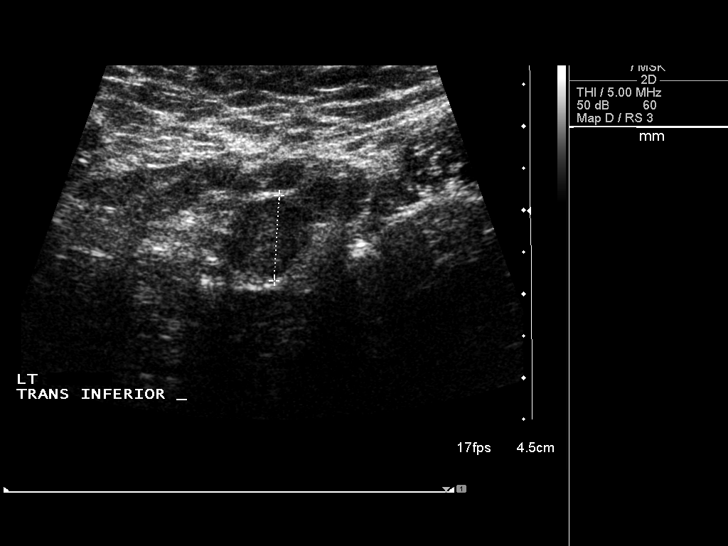
[im 15/24]
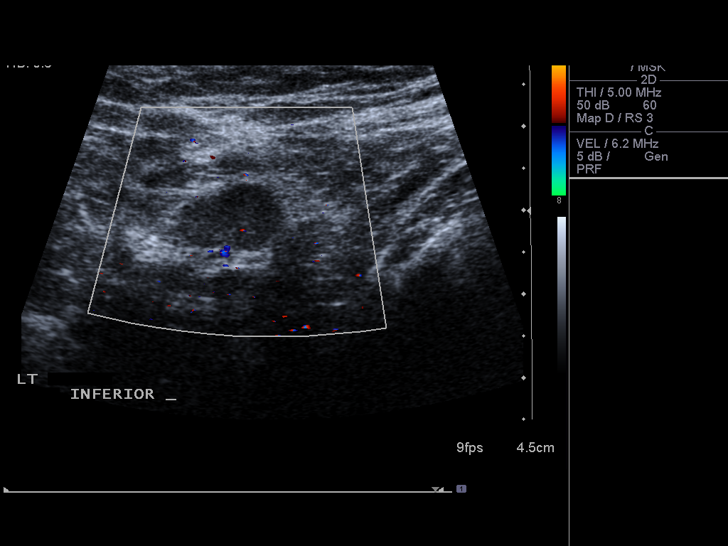
[im 17/24]
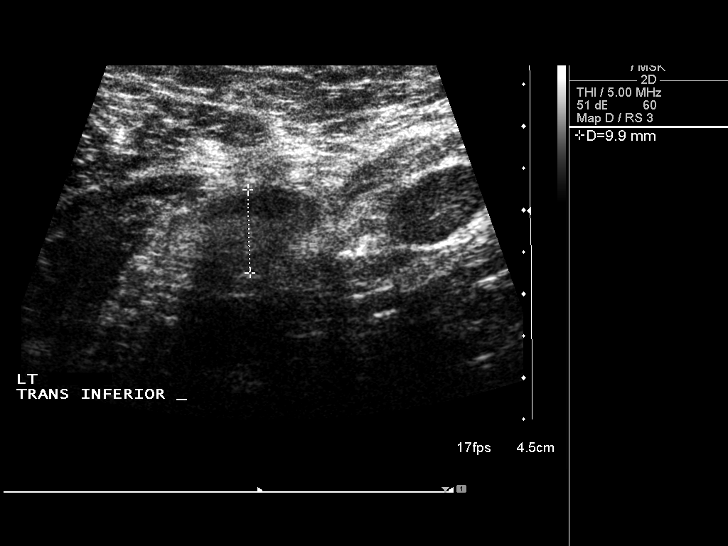
[im 19/24]
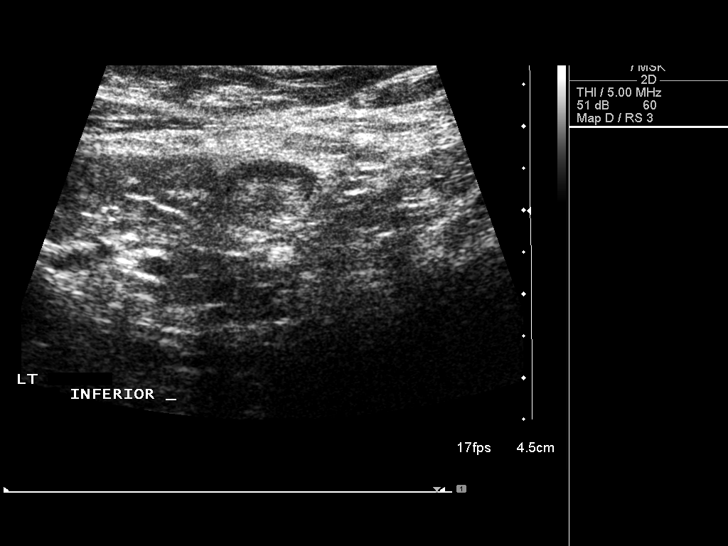
[im 20/24]
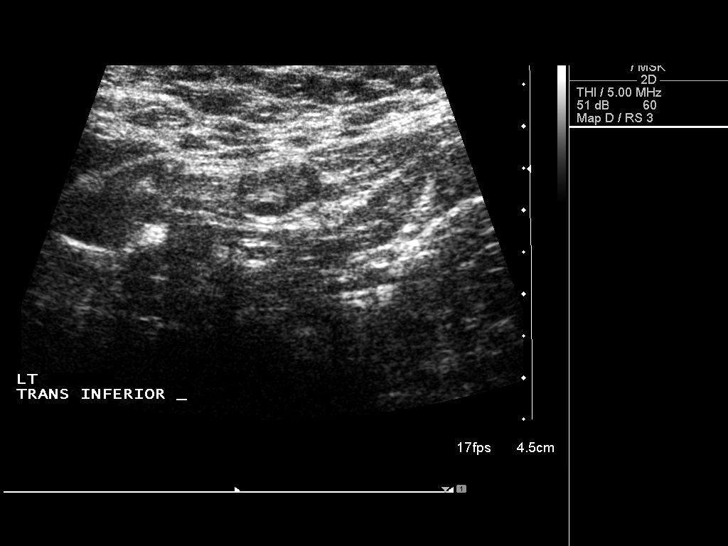
[im 22/24]
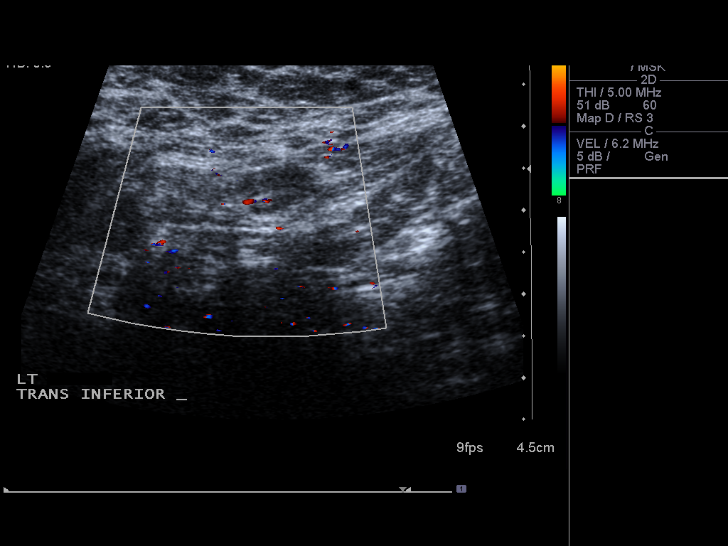
[im 24/24]
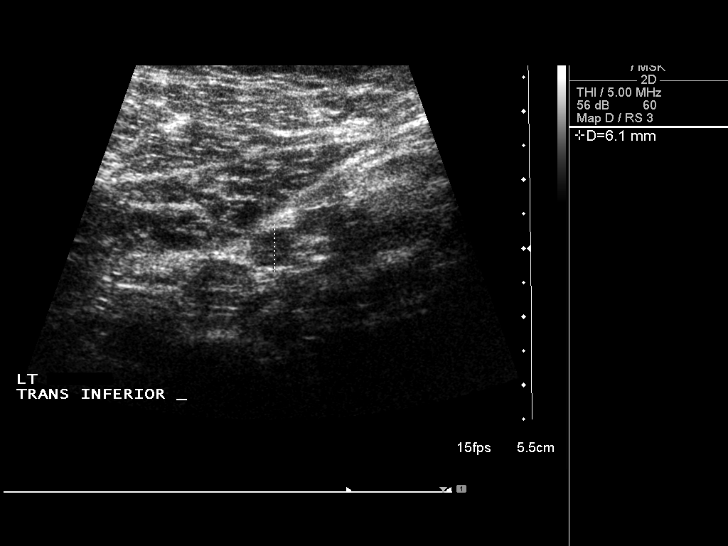

[14 of 24 positions shown; findings below may reference images not displayed]

FINDINGS: Bilateral axillary lymph nodes have are stable to slightly decreased
in size since the prior study.

No enlarged or suspicious lymph nodes are noted.
IMPRESSION: Stable to slightly decreased bilateral axillary lymph nodes since

## 2014-06-29 ENCOUNTER — Ambulatory Visit (INDEPENDENT_AMBULATORY_CARE_PROVIDER_SITE_OTHER): Payer: BLUE CROSS/BLUE SHIELD | Admitting: Cardiology

## 2014-06-29 ENCOUNTER — Encounter: Payer: Self-pay | Admitting: Cardiology

## 2014-06-29 VITALS — BP 130/80 | HR 82 | Ht 69.0 in | Wt 167.8 lb

## 2014-06-29 DIAGNOSIS — Z01812 Encounter for preprocedural laboratory examination: Secondary | ICD-10-CM | POA: Diagnosis not present

## 2014-06-29 DIAGNOSIS — I10 Essential (primary) hypertension: Secondary | ICD-10-CM

## 2014-06-29 DIAGNOSIS — I2510 Atherosclerotic heart disease of native coronary artery without angina pectoris: Secondary | ICD-10-CM

## 2014-06-29 DIAGNOSIS — E785 Hyperlipidemia, unspecified: Secondary | ICD-10-CM | POA: Diagnosis not present

## 2014-06-29 DIAGNOSIS — Z9861 Coronary angioplasty status: Secondary | ICD-10-CM

## 2014-06-29 LAB — BASIC METABOLIC PANEL
BUN: 13 mg/dL (ref 6–23)
CO2: 28 meq/L (ref 19–32)
CREATININE: 1.03 mg/dL (ref 0.40–1.50)
Calcium: 9.7 mg/dL (ref 8.4–10.5)
Chloride: 103 mEq/L (ref 96–112)
GFR: 100.44 mL/min (ref >60.00)
Glucose, Bld: 93 mg/dL (ref 70–99)
POTASSIUM: 4.3 meq/L (ref 3.5–5.1)
SODIUM: 136 meq/L (ref 135–145)

## 2014-06-29 LAB — CBC WITH DIFFERENTIAL/PLATELET
BASOS ABS: 0 10*3/uL (ref 0.0–0.1)
BASOS PCT: 0.3 % (ref 0.0–3.0)
Eosinophils Absolute: 0.3 10*3/uL (ref 0.0–0.7)
Eosinophils Relative: 4.9 % (ref 0.0–5.0)
HCT: 44.2 % (ref 39.0–52.0)
HEMOGLOBIN: 14.8 g/dL (ref 13.0–17.0)
Lymphocytes Relative: 25.3 % (ref 12.0–46.0)
Lymphs Abs: 1.7 10*3/uL (ref 0.7–4.0)
MCHC: 33.4 g/dL (ref 30.0–36.0)
MCV: 83.7 fl (ref 78.0–100.0)
MONO ABS: 0.5 10*3/uL (ref 0.1–1.0)
Monocytes Relative: 7.7 % (ref 3.0–12.0)
NEUTROS ABS: 4.1 10*3/uL (ref 1.4–7.7)
Neutrophils Relative %: 61.8 % (ref 43.0–77.0)
Platelets: 260 10*3/uL (ref 150.0–400.0)
RBC: 5.28 Mil/uL (ref 4.22–5.81)
RDW: 15.2 % (ref 11.5–15.5)
WBC: 6.7 10*3/uL (ref 4.0–10.5)

## 2014-06-29 LAB — APTT: aPTT: 34.4 s — ABNORMAL HIGH (ref 23.4–32.7)

## 2014-06-29 LAB — PROTIME-INR
INR: 0.9 ratio (ref 0.8–1.0)
PROTHROMBIN TIME: 9.7 s (ref 9.6–13.1)

## 2014-06-29 NOTE — Patient Instructions (Signed)
Medication Instructions:  Your physician recommends that you continue on your current medications as directed. Please refer to the Current Medication list given to you today.   Labwork: TODAY: BMET, CBC, PT, INR  Testing/Procedures: Your physician has requested that you have a cardiac catheterization. Cardiac catheterization is used to diagnose and/or treat various heart conditions. Doctors may recommend this procedure for a number of different reasons. The most common reason is to evaluate chest pain. Chest pain can be a symptom of coronary artery disease (CAD), and cardiac catheterization can show whether plaque is narrowing or blocking your heart's arteries. This procedure is also used to evaluate the valves, as well as measure the blood flow and oxygen levels in different parts of your heart. For further information please visit [email or web address] Please follow instruction sheet, as given.  Follow-Up: Your physician wants you to follow-up in: 6 months with Dr. Mayford Knifeurner. You will receive a reminder letter in the mail two months in advance. If you don't receive a letter, please call our office to schedule the follow-up appointment.   Any Other Special Instructions Will Be Listed Below (If Applicable).

## 2014-06-29 NOTE — Progress Notes (Signed)
  Cardiology Office Note   Date:  06/29/2014   ID:  Joseph Carr, DOB 05/28/1970, MRN [id number]  PCP:  Shamleffer, Ibethal JARALLA, MD  Cardiologist:   TURNER,TRACI R, MD   Chief Complaint  Patient presents with  . Follow-up    essential hypertension, CAD with NSTEMI      History of Present Illness: Joseph Carr is a 44 y.o. male with a history of CAD s/p NSTEMI 10/2013 s/p PCI with DES to RI, HTN, tobacco abuse, HL. He goes to cardiac rehab regularly.  He continues to have chest pain that is identical to his pain he had with his MI.  It is usually exertional when he is walking fast.  He left arm will go numb with the pain. He had a stress myoview that was normal.  He denies significant dyspnea, orthopnea, PND, edema. He denies syncope. He denies SOB, dizziness, palpitations or syncope.    Past Medical History  Diagnosis Date  . Hypertension   . CAD (coronary artery disease)     a. s/p STEMI 10/2013 - DES to ramus intermedius. Patent LM, LAD, RCA, EF 55%.  . Hyperlipidemia LDL goal <70   . Low TSH level   . Tobacco abuse   . Hx of cardiovascular stress test     ETT/Lexiscan Myoview (11/15):  Inferior/inferolateral fixed defect c/w soft tissue attenuation and / or subendocardial scar No significant ischemia. Overall low to intermediate risk scan.LV Ejection Fraction: 51%.    Past Surgical History  Procedure Laterality Date  . Finger fracture surgery Left 2014    4th finger  . Left heart catheterization with coronary angiogram N/A 10/20/2013    Procedure: LEFT HEART CATHETERIZATION WITH CORONARY ANGIOGRAM;  Surgeon: Jayadeep S Varanasi, MD;  Location: [facility] CATH LAB;  Service: Cardiovascular;  Laterality: N/A;     Current Outpatient Prescriptions  Medication Sig Dispense Refill  . acetaminophen (TYLENOL) 325 MG tablet Take 2 tablets (650 mg total) by mouth every 4 (four) hours as needed for headache or mild pain.    . aspirin EC 81 MG tablet Take 81 mg by mouth  daily.    . atorvastatin (LIPITOR) 80 MG tablet Take 1 tablet (80 mg total) by mouth daily at 6 PM. 30 tablet 6  . diazepam (VALIUM) 2 MG tablet Take 1 tablet (2 mg total) by mouth 2 (two) times daily. 6 tablet 0  . ezetimibe (ZETIA) 10 MG tablet Take 1 tablet (10 mg total) by mouth daily. 90 tablet 3  . lisinopril-hydrochlorothiazide (PRINZIDE,ZESTORETIC) 20-25 MG per tablet Take 1 tablet by mouth daily. 30 tablet 2  . metoprolol tartrate (LOPRESSOR) 25 MG tablet TAKE ONE TABLET BY MOUTH TWICE DAILY 60 tablet 6  . nitroGLYCERIN (NITROSTAT) 0.4 MG SL tablet Place 1 tablet (0.4 mg total) under the tongue every 5 (five) minutes x 3 doses as needed for chest pain. 25 tablet 4  . ticagrelor (BRILINTA) 90 MG TABS tablet Take 1 tablet (90 mg total) by mouth 2 (two) times daily. This is for regular prescription. 60 tablet 11   No current facility-administered medications for this visit.    Allergies:   Review of patient's allergies indicates no known allergies.    Social History:  The patient  reports that he has been smoking Cigarettes.  He has a 22.5 pack-year smoking history. He has never used smokeless tobacco. He reports that he drinks alcohol. He reports that he does not use illicit drugs.   Family History:    The patient's family history includes Heart attack in his father; Hypertension in his father; Stroke in his father.    ROS:  Please see the history of present illness.   Otherwise, review of systems are positive for none.   All other systems are reviewed and negative.    PHYSICAL EXAM: VS:  BP 130/80 mmHg  Pulse 82  Ht 5' 9" (1.753 m)  Wt 167 lb 12.8 oz (76.114 kg)  BMI 24.77 kg/m2  SpO2 99% , BMI Body mass index is 24.77 kg/(m^2). GEN: Well nourished, well developed, in no acute distress HEENT: normal Neck: no JVD, carotid bruits, or masses Cardiac: RRR; no murmurs, rubs, or gallops,no edema  Respiratory:  clear to auscultation bilaterally, normal work of breathing GI: soft,  nontender, nondistended, + BS MS: no deformity or atrophy Skin: warm and dry, no rash Neuro:  Strength and sensation are intact Psych: euthymic mood, full affect   EKG:  EKG was not ordered today.    Recent Labs: 10/18/2013: Magnesium 2.4 10/30/2013: TSH 0.32* 02/11/2014: BUN 14; Creatinine 0.90; Hemoglobin 15.0; Platelets 322; Potassium 3.7; Sodium 138 03/05/2014: ALT 21    Lipid Panel    Component Value Date/Time   CHOL 146 03/05/2014 0843   TRIG 148.0 03/05/2014 0843   HDL 44.40 03/05/2014 0843   CHOLHDL 3 03/05/2014 0843   VLDL 29.6 03/05/2014 0843   LDLCALC 72 03/05/2014 0843      Wt Readings from Last 3 Encounters:  06/29/14 167 lb 12.8 oz (76.114 kg)  04/04/14 169 lb (76.658 kg)  01/08/14 166 lb (75.297 kg)      ASSESSMENT AND PLAN:  1. Chest pain, unspecified chest pain type - stress myoview with no ischemia.  He continues to have chest pain that is identical to his pain he had with his MI.  It is usually exertional when he is walking fast.  He left arm will go numb with the pain.  His lesion was in a ramus branch which may not show up on nuclear stress test.  He has had to stop working some due to pain.  I think at this point we should bring him back to the cath lab to assess the patency of the RI stent.  He had no other significant CAD on cath last fall.   2. Coronary artery disease with remote NSTEMI: S/P PCI of RI - Continue aspirin, Brilinta, statin, beta blocker. 3. Essential hypertension: Controlled. Continue BB/ACE I/ diuretic 4. HLD (hyperlipidemia): Continue statin/Zetia. LDL near goal .  5. Former Cigarette Smoker: He has stopped smoking.   Current medicines are reviewed at length with the patient today.  The patient does not have concerns regarding medicines.  The following changes have been made:  no change  Labs/ tests ordered today include: none  No orders of the defined types were placed in this encounter.     Disposition:   FU  with me in 6 months  Signed, TURNER,TRACI R, MD  06/29/2014 9:33 AM    [facility] [address], [place], [place]  [address] Phone: [phone]; Fax: [phone]   

## 2014-07-03 ENCOUNTER — Encounter (HOSPITAL_COMMUNITY): Payer: Self-pay | Admitting: Cardiology

## 2014-07-03 ENCOUNTER — Encounter (HOSPITAL_COMMUNITY)
Admission: RE | Disposition: A | Discharge status: Home / Self Care | Payer: BLUE CROSS/BLUE SHIELD | Source: Ambulatory Visit | Attending: Cardiology

## 2014-07-03 ENCOUNTER — Ambulatory Visit (HOSPITAL_COMMUNITY)
Admission: RE | Admit: 2014-07-03 | Discharge: 2014-07-03 | Disposition: A | Discharge status: Home / Self Care | Payer: BLUE CROSS/BLUE SHIELD | Source: Ambulatory Visit | Attending: Cardiology | Admitting: Cardiology

## 2014-07-03 DIAGNOSIS — I209 Angina pectoris, unspecified: Secondary | ICD-10-CM | POA: Diagnosis present

## 2014-07-03 DIAGNOSIS — Z79899 Other long term (current) drug therapy: Secondary | ICD-10-CM | POA: Insufficient documentation

## 2014-07-03 DIAGNOSIS — Z9861 Coronary angioplasty status: Secondary | ICD-10-CM | POA: Diagnosis not present

## 2014-07-03 DIAGNOSIS — I10 Essential (primary) hypertension: Secondary | ICD-10-CM | POA: Diagnosis present

## 2014-07-03 DIAGNOSIS — E785 Hyperlipidemia, unspecified: Secondary | ICD-10-CM | POA: Diagnosis present

## 2014-07-03 DIAGNOSIS — Z7982 Long term (current) use of aspirin: Secondary | ICD-10-CM | POA: Diagnosis not present

## 2014-07-03 DIAGNOSIS — I25119 Atherosclerotic heart disease of native coronary artery with unspecified angina pectoris: Secondary | ICD-10-CM | POA: Insufficient documentation

## 2014-07-03 DIAGNOSIS — I252 Old myocardial infarction: Secondary | ICD-10-CM | POA: Insufficient documentation

## 2014-07-03 DIAGNOSIS — I2510 Atherosclerotic heart disease of native coronary artery without angina pectoris: Secondary | ICD-10-CM

## 2014-07-03 DIAGNOSIS — F17210 Nicotine dependence, cigarettes, uncomplicated: Secondary | ICD-10-CM | POA: Diagnosis not present

## 2014-07-03 DIAGNOSIS — Z720 Tobacco use: Secondary | ICD-10-CM | POA: Diagnosis present

## 2014-07-03 HISTORY — PX: CARDIAC CATHETERIZATION: SHX172

## 2014-07-03 HISTORY — PX: PERCUTANEOUS CORONARY STENT INTERVENTION (PCI-S): SHX6016

## 2014-07-03 LAB — POCT ACTIVATED CLOTTING TIME: Activated Clotting Time: 706 seconds

## 2014-07-03 SURGERY — LEFT HEART CATH AND CORONARY ANGIOGRAPHY

## 2014-07-03 MED ORDER — BIVALIRUDIN BOLUS VIA INFUSION - CUPID
INTRAVENOUS | Status: DC | PRN
  Administered 2014-07-03: 56.85 mg via INTRAVENOUS

## 2014-07-03 MED ORDER — ATORVASTATIN CALCIUM 80 MG PO TABS
80.0000 mg | ORAL_TABLET | Freq: Every day | ORAL | Status: DC
  Filled 2014-07-03: qty 1

## 2014-07-03 MED ORDER — HEPARIN SODIUM (PORCINE) 1000 UNIT/ML IJ SOLN
INTRAMUSCULAR | Status: AC
  Filled 2014-07-03: qty 1

## 2014-07-03 MED ORDER — PNEUMOCOCCAL VAC POLYVALENT 25 MCG/0.5ML IJ INJ
0.5000 mL | INJECTION | Freq: Once | INTRAMUSCULAR | Status: AC
  Administered 2014-07-03: 0.5 mL via INTRAMUSCULAR
  Filled 2014-07-03: qty 0.5

## 2014-07-03 MED ORDER — HYDROCHLOROTHIAZIDE 25 MG PO TABS
25.0000 mg | ORAL_TABLET | Freq: Every day | ORAL | Status: DC
  Administered 2014-07-03: 25 mg via ORAL
  Filled 2014-07-03: qty 1

## 2014-07-03 MED ORDER — SODIUM CHLORIDE 0.9 % WEIGHT BASED INFUSION
3.0000 mL/kg/h | INTRAVENOUS | Status: AC
  Administered 2014-07-03: 3 mL/kg/h via INTRAVENOUS

## 2014-07-03 MED ORDER — MIDAZOLAM HCL 2 MG/2ML IJ SOLN
INTRAMUSCULAR | Status: AC
  Filled 2014-07-03: qty 2

## 2014-07-03 MED ORDER — TICAGRELOR 90 MG PO TABS: 90.0000 mg | ORAL_TABLET | Freq: Two times a day (BID) | ORAL | Status: DC

## 2014-07-03 MED ORDER — SODIUM CHLORIDE 0.9 % IJ SOLN: 3.0000 mL | Freq: Two times a day (BID) | INTRAMUSCULAR | Status: DC

## 2014-07-03 MED ORDER — FENTANYL CITRATE (PF) 100 MCG/2ML IJ SOLN
INTRAMUSCULAR | Status: AC
  Filled 2014-07-03: qty 2

## 2014-07-03 MED ORDER — VERAPAMIL HCL 2.5 MG/ML IV SOLN
INTRAVENOUS | Status: DC | PRN
  Administered 2014-07-03: 08:00:00 via INTRA_ARTERIAL

## 2014-07-03 MED ORDER — SODIUM CHLORIDE 0.9 % IV SOLN: 250.0000 mL | INTRAVENOUS | Status: DC | PRN

## 2014-07-03 MED ORDER — ACETAMINOPHEN 325 MG PO TABS: 650.0000 mg | ORAL_TABLET | ORAL | Status: DC | PRN

## 2014-07-03 MED ORDER — SODIUM CHLORIDE 0.9 % IJ SOLN: 3.0000 mL | INTRAMUSCULAR | Status: DC | PRN

## 2014-07-03 MED ORDER — LISINOPRIL 10 MG PO TABS
20.0000 mg | ORAL_TABLET | Freq: Every day | ORAL | Status: DC
  Administered 2014-07-03: 20 mg via ORAL
  Filled 2014-07-03: qty 2

## 2014-07-03 MED ORDER — NITROGLYCERIN 1 MG/10 ML FOR IR/CATH LAB
INTRA_ARTERIAL | Status: DC | PRN
  Administered 2014-07-03: 200 ug via INTRA_ARTERIAL

## 2014-07-03 MED ORDER — NITROGLYCERIN 0.4 MG SL SUBL: 0.4000 mg | SUBLINGUAL_TABLET | SUBLINGUAL | Status: DC | PRN

## 2014-07-03 MED ORDER — SODIUM CHLORIDE 0.9 % IV SOLN
250.0000 mg | INTRAVENOUS | Status: DC | PRN
  Administered 2014-07-03: 1.75 mg/kg/h via INTRAVENOUS

## 2014-07-03 MED ORDER — FENTANYL CITRATE (PF) 100 MCG/2ML IJ SOLN
INTRAMUSCULAR | Status: DC | PRN
  Administered 2014-07-03: 50 ug via INTRAVENOUS

## 2014-07-03 MED ORDER — HEPARIN (PORCINE) IN NACL 2-0.9 UNIT/ML-% IJ SOLN
INTRAMUSCULAR | Status: AC
Start: 2014-07-03 — End: 2014-07-03
  Filled 2014-07-03: qty 1500

## 2014-07-03 MED ORDER — HEPARIN SODIUM (PORCINE) 1000 UNIT/ML IJ SOLN
INTRAMUSCULAR | Status: DC | PRN
  Administered 2014-07-03: 4000 [IU] via INTRAVENOUS

## 2014-07-03 MED ORDER — MIDAZOLAM HCL 2 MG/2ML IJ SOLN
INTRAMUSCULAR | Status: DC | PRN
  Administered 2014-07-03: 2 mg via INTRAVENOUS

## 2014-07-03 MED ORDER — LISINOPRIL-HYDROCHLOROTHIAZIDE 20-25 MG PO TABS: 1.0000 | ORAL_TABLET | Freq: Every day | ORAL | Status: DC

## 2014-07-03 MED ORDER — SODIUM CHLORIDE 0.9 % WEIGHT BASED INFUSION
3.0000 mL/kg/h | INTRAVENOUS | Status: DC
  Administered 2014-07-03: 3 mL/kg/h via INTRAVENOUS

## 2014-07-03 MED ORDER — METOPROLOL TARTRATE 25 MG PO TABS
25.0000 mg | ORAL_TABLET | Freq: Two times a day (BID) | ORAL | Status: DC
  Administered 2014-07-03: 25 mg via ORAL
  Filled 2014-07-03: qty 1

## 2014-07-03 MED ORDER — IOHEXOL 350 MG/ML SOLN
INTRAVENOUS | Status: DC | PRN
  Administered 2014-07-03: 130 mL via INTRAVENOUS

## 2014-07-03 MED ORDER — LIDOCAINE HCL (PF) 1 % IJ SOLN
INTRAMUSCULAR | Status: DC | PRN
  Administered 2014-07-03: 5 mL via INTRADERMAL

## 2014-07-03 MED ORDER — ASPIRIN 81 MG PO CHEW
81.0000 mg | CHEWABLE_TABLET | Freq: Every day | ORAL | Status: DC
  Administered 2014-07-03: 81 mg via ORAL
  Filled 2014-07-03: qty 1

## 2014-07-03 MED ORDER — VERAPAMIL HCL 2.5 MG/ML IV SOLN
INTRAVENOUS | Status: AC
  Filled 2014-07-03: qty 2

## 2014-07-03 MED ORDER — LIDOCAINE HCL (PF) 1 % IJ SOLN
INTRAMUSCULAR | Status: AC
  Filled 2014-07-03: qty 30

## 2014-07-03 MED ORDER — SODIUM CHLORIDE 0.9 % WEIGHT BASED INFUSION: 1.0000 mL/kg/h | INTRAVENOUS | Status: DC

## 2014-07-03 MED ORDER — ASPIRIN 81 MG PO CHEW: 81.0000 mg | CHEWABLE_TABLET | ORAL | Status: DC

## 2014-07-03 MED ORDER — BIVALIRUDIN 250 MG IV SOLR
INTRAVENOUS | Status: AC
  Filled 2014-07-03: qty 250

## 2014-07-03 MED ORDER — ONDANSETRON HCL 4 MG/2ML IJ SOLN: 4.0000 mg | Freq: Four times a day (QID) | INTRAMUSCULAR | Status: DC | PRN

## 2014-07-03 SURGICAL SUPPLY — 19 items
BALLN EUPHORA RX 2.0X12 (BALLOONS) ×3
BALLN ~~LOC~~ EUPHORA RX 2.75X8 (BALLOONS) ×3
BALLOON EUPHORA RX 2.0X12 (BALLOONS) ×1 IMPLANT
BALLOON ~~LOC~~ EUPHORA RX 2.75X8 (BALLOONS) ×1 IMPLANT
CATH INFINITI 5 FR JL3.5 (CATHETERS) ×3 IMPLANT
CATH INFINITI 5FR ANG PIGTAIL (CATHETERS) ×3 IMPLANT
CATH INFINITI JR4 5F (CATHETERS) ×3 IMPLANT
DEVICE RAD COMP TR BAND LRG (VASCULAR PRODUCTS) ×3 IMPLANT
GLIDESHEATH SLEND SS 6F .021 (SHEATH) ×3 IMPLANT
GUIDE CATH RUNWAY 6FR CLS3 (CATHETERS) ×3 IMPLANT
KIT ENCORE 26 ADVANTAGE (KITS) ×3 IMPLANT
KIT HEART LEFT (KITS) ×3 IMPLANT
PACK CARDIAC CATHETERIZATION (CUSTOM PROCEDURE TRAY) ×3 IMPLANT
STENT PROMUS PREM MR 2.5X12 (Permanent Stent) ×3 IMPLANT
SYR MEDRAD MARK V 150ML (SYRINGE) ×3 IMPLANT
TRANSDUCER W/STOPCOCK (MISCELLANEOUS) ×3 IMPLANT
TUBING CIL FLEX 10 FLL-RA (TUBING) ×3 IMPLANT
WIRE ASAHI PROWATER 180CM (WIRE) ×3 IMPLANT
WIRE SAFE-T 1.5MM-J .035X260CM (WIRE) ×3 IMPLANT

## 2014-07-03 NOTE — Care Management Note (Signed)
Case Management Note  Patient Details  Name: Joseph Carr RolesJames M Stamp MRN: [id number] Date of Birth: 21-Jan-1971  Subjective/Objective:          Chest pain         Action/Plan:   Expected Discharge Date:                  Expected Discharge Plan:  Home/Self Care  In-House Referral:     Discharge planning Services  CM Consult, Medication Assistance   Status of Service:     Medicare Important Message Given:  No Date Medicare IM Given:    Medicare IM give by:    Date Additional Medicare IM Given:    Additional Medicare Important Message give by:     If discussed at Long Length of Stay Meetings, dates discussed:    Additional Comments: NCM spoke to pt and he is with [facility] which provides him with Brilinta and ASA. No NCM needs identified.  Elliot CousinShavis,  Ellen, RN 07/03/2014, 1:49 PM

## 2014-07-03 NOTE — Discharge Instructions (Signed)

## 2014-07-03 NOTE — Progress Notes (Deleted)
CARDIAC REHAB PHASE I   PRE:Rate/Rhythm: 84 afib  BP: sitting 100/59  SaO2: 96 RA  MODE: Ambulation: 350 ft  POST:Rate/Rhythm: 125 regular while walking, 80 afib then 40s SB at rest  BP: lying 84/56, 92/69  SaO2: 94 RA  Pt steady walking, no LOB. Used RW and gait belt. Pt c/o lightheadedness entire walk. SAO2 stable on RA but HR elevated then dropping to 40s with sinus beats after walking. BP low after walk in bed after 3 min. Notified RN. Pt asking appropriate questions regarding his heart and meds.  [id number] ,  Kristan CES, ACSM 07/03/2014 2:39 PM 

## 2014-07-03 NOTE — Discharge Summary (Signed)
Discharge Summary   Patient ID: Joseph Carr,  MRN: [id number], DOB/AGE: [birth year] 44 y.o.  Admit date: 07/03/2014 Discharge date: 07/03/2014  Primary Care Provider: Tommy RainwaterShamleffer, Ibethal JARALLA Primary Cardiologist: T. Turner, MD   Discharge Diagnoses Principal Problem:   Angina pectoris syndrome  **Status post successful PCI and stenting of the ramus intermedius with a drug-eluting stent. Active Problems:   CAD with NSTEMI 10/2013 s/p PCI of Ramus Intermedius - Promus Premier DES 2.5 mm  20 mm (2.75 mm)    Essential hypertension   Tobacco abuse   Hyperlipidemia LDL goal <70  Allergies No Known Allergies  Procedures  Cardiac Catheterization and Percutaneous Coronary Intervention 5.27.2016  Coronary Findings     Dominance: Right    Left Main  The vessel , is normal in caliber is angiographically normal.      Left Anterior Descending   . Prox LAD lesion, 15% stenosed. diffuse .   Marland Kitchen. Second Diagonal Branch   The vessel is small in size.      Ramus Intermedius  The vessel , is large .   Marland Kitchen. Ramus-1 lesion, 0% stenosed. Previously placed Ramus-1 drug eluting stent is patent.   . Ramus-2 lesion, 90% stenosed. discrete . Just distal to prior stent and before bifurcation.   Marland Kitchen. PCI: The second RI was successfully stented using a 2.5 x 12 mm Promus Premier DES.        Left Circumflex   . Prox Cx lesion, 40% stenosed. discrete .    Left Heart     Left Ventricle The left ventricular size is normal. The left ventricular systolic function is normal. The left ventricular ejection fraction is 55-65% by visual estimate. There are no wall motion abnormalities in the left ventricle.    Mitral Valve There is no mitral valve regurgitation.  _____________   History of Present Illness  44 y/o male with a h/o coronary artery disease status post non-ST segment elevation myocardial infarction in September 2015 requiring drilling stent placement to the ramus intermedius. He also has a  history of hypertension, hyperlipidemia, and tobacco abuse. He was recently seen in clinic with complaints of exertional chest discomfort and decision was made to pursue repeat diagnostic catheterization.  Hospital Course  [facility] cardiac [facility] laboratory in the morning of 07/03/2014 underwent diagnostic catheterization. This revealed a 90% proximal stenosis in the ramus intermedius just distal to previously placed stent. This area was successfully intervened upon using a 2.5 x 12 mm Promus Premier drug eluting stent. Patient tolerated procedure well and postprocedure has been doing well without recurrent symptoms, limitations or bleeding from his radial site. He is felt to be stable for discharge today and will follow-up in the office next week.   Discharge Vitals Blood pressure 142/84, pulse 77, temperature 97.9 F (36.6 C), temperature source Oral, resp. rate 18, height 5\' 9"  (1.753 m), weight 167 lb (75.751 kg), SpO2 100 %.  Filed Weights   07/03/14 0540  Weight: 167 lb (75.751 kg)   Labs  None  Disposition  Pt is being discharged home today in good condition.  Follow-up Plans & Appointments  Follow-up Information    Follow up with [name], PA-C On 07/09/2014.   Specialties:  Physician Assistant, Radiology, Interventional Cardiology   Why:  9:50 - Dr. Norris Crossurner's PA.   Contact information:   [address] [place] [address] [phone]       Discharge Medications    Medication List  TAKE these medications        aspirin EC 81 MG tablet  Take 81 mg by mouth daily.     atorvastatin 80 MG tablet  Commonly known as:  LIPITOR  Take 1 tablet (80 mg total) by mouth daily at 6 PM.     ezetimibe 10 MG tablet  Commonly known as:  ZETIA  Take 1 tablet (10 mg total) by mouth daily.     lisinopril-hydrochlorothiazide 20-25 MG per tablet  Commonly known as:  PRINZIDE,ZESTORETIC  Take 1 tablet by mouth daily.      metoprolol tartrate 25 MG tablet  Commonly known as:  LOPRESSOR  TAKE ONE TABLET BY MOUTH TWICE DAILY     nitroGLYCERIN 0.4 MG SL tablet  Commonly known as:  NITROSTAT  Place 1 tablet (0.4 mg total) under the tongue every 5 (five) minutes x 3 doses as needed for chest pain.     ticagrelor 90 MG Tabs tablet  Commonly known as:  BRILINTA  Take 1 tablet (90 mg total) by mouth 2 (two) times daily. This is for regular prescription.        Outstanding Labs/Studies  None single-vessel radial  Duration of Discharge Encounter   Greater than 30 minutes including physician time.  Signed, Nicolasa Ducking NP 07/03/2014, 3:47 PM

## 2014-07-03 NOTE — Interval H&P Note (Signed)
History and Physical Interval Note:  07/03/2014 7:35 AM  Joseph RolesJames M Carr  has presented today for surgery, with the diagnosis of cp  The various methods of treatment have been discussed with the patient and family. After consideration of risks, benefits and other options for treatment, the patient has consented to  Procedure(s): Left Heart Cath and Coronary Angiography (N/A) as a surgical intervention .  The patient's history has been reviewed, patient examined, no change in status, stable for surgery.  I have reviewed the patient's chart and labs.  Questions were answered to the patient's satisfaction.    Cath Lab Visit (complete for each Cath Lab visit)  Clinical Evaluation Leading to the Procedure:   ACS: No.  Non-ACS:    Anginal Classification: CCS III  Anti-ischemic medical therapy: Minimal Therapy (1 class of medications)  Non-Invasive Test Results: No non-invasive testing performed  Prior CABG: No previous CABG       Theron Aristaeter [facility] ZionJordanMD,FACC 07/03/2014 7:35 AM

## 2014-07-03 NOTE — Progress Notes (Signed)
TR BAND REMOVAL  LOCATION:  right radial  DEFLATED PER PROTOCOL:  Yes.    TIME BAND OFF / DRESSING APPLIED:   1230   SITE UPON ARRIVAL:   Level 0  SITE AFTER BAND REMOVAL:  Level 0  REVERSE ALLEN'S TEST:    positive  CIRCULATION SENSATION AND MOVEMENT:  Within Normal Limits  Yes.    COMMENTS:

## 2014-07-03 NOTE — Progress Notes (Signed)
CARDIAC REHAB PHASE I   PRE:  Rate/Rhythm: 78 SR  BP:   Sitting: 147/105     SaO2:   MODE:  Ambulation: 1000 ft   POST:  Rate/Rhythm: 88 SR  BP:   Sitting: 150/90     SaO2: 1351-1437  Pt ambulated 106500ft without complications. Maintained steady gait and required no assistance. Returned pt to bed in upright position to check vitals, VSS . Pt. BP was high, notified nurse. Post walk BP taken manually.Did education and smoking cessation. Pt. is willing to wean off smoking and to take meds on a consistent basis. Offered CRPII and pt declined at this time. CRP II brochure was left with pt. in case he changes his mind.   D ,MS,ACSM-RCEP 07/03/2014 2:32 PM

## 2014-07-03 NOTE — H&P (View-Only) (Signed)
Cardiology Office Note   Date:  06/29/2014   ID:  Joseph RolesJames M Dagher, DOB 1970-02-27, MRN [id number]  PCP:  Lonzo CloudShamleffer, Landry MellowIbethal JARALLA, MD  Cardiologist:   Quintella ReichertURNER,TRACI R, MD   Chief Complaint  Patient presents with  . Follow-up    essential hypertension, CAD with NSTEMI      History of Present Illness: Joseph Carr is a 44 y.o. male with a history of CAD s/p NSTEMI 10/2013 s/p PCI with DES to RI, HTN, tobacco abuse, HL. He goes to cardiac rehab regularly.  He continues to have chest pain that is identical to his pain he had with his MI.  It is usually exertional when he is walking fast.  He left arm will go numb with the pain. He had a stress myoview that was normal.  He denies significant dyspnea, orthopnea, PND, edema. He denies syncope. He denies SOB, dizziness, palpitations or syncope.    Past Medical History  Diagnosis Date  . Hypertension   . CAD (coronary artery disease)     a. s/p STEMI 10/2013 - DES to ramus intermedius. Patent LM, LAD, RCA, EF 55%.  Marland Kitchen. Hyperlipidemia LDL goal <70   . Low TSH level   . Tobacco abuse   . Hx of cardiovascular stress test     ETT/Lexiscan Myoview (11/15):  Inferior/inferolateral fixed defect c/w soft tissue attenuation and / or subendocardial scar No significant ischemia. Overall low to intermediate risk scan.LV Ejection Fraction: 51%.    Past Surgical History  Procedure Laterality Date  . Finger fracture surgery Left 2014    4th finger  . Left heart catheterization with coronary angiogram N/A 10/20/2013    Procedure: LEFT HEART CATHETERIZATION WITH CORONARY ANGIOGRAM;  Surgeon: Corky CraftsJayadeep S Varanasi, MD;  [facility];  Service: Cardiovascular;  Laterality: N/A;     Current Outpatient Prescriptions  Medication Sig Dispense Refill  . acetaminophen (TYLENOL) 325 MG tablet Take 2 tablets (650 mg total) by mouth every 4 (four) hours as needed for headache or mild pain.    Marland Kitchen. aspirin EC 81 MG tablet Take 81 mg by mouth  daily.    Marland Kitchen. atorvastatin (LIPITOR) 80 MG tablet Take 1 tablet (80 mg total) by mouth daily at 6 PM. 30 tablet 6  . diazepam (VALIUM) 2 MG tablet Take 1 tablet (2 mg total) by mouth 2 (two) times daily. 6 tablet 0  . ezetimibe (ZETIA) 10 MG tablet Take 1 tablet (10 mg total) by mouth daily. 90 tablet 3  . lisinopril-hydrochlorothiazide (PRINZIDE,ZESTORETIC) 20-25 MG per tablet Take 1 tablet by mouth daily. 30 tablet 2  . metoprolol tartrate (LOPRESSOR) 25 MG tablet TAKE ONE TABLET BY MOUTH TWICE DAILY 60 tablet 6  . nitroGLYCERIN (NITROSTAT) 0.4 MG SL tablet Place 1 tablet (0.4 mg total) under the tongue every 5 (five) minutes x 3 doses as needed for chest pain. 25 tablet 4  . ticagrelor (BRILINTA) 90 MG TABS tablet Take 1 tablet (90 mg total) by mouth 2 (two) times daily. This is for regular prescription. 60 tablet 11   No current facility-administered medications for this visit.    Allergies:   Review of patient's allergies indicates no known allergies.    Social History:  The patient  reports that he has been smoking Cigarettes.  He has a 22.5 pack-year smoking history. He has never used smokeless tobacco. He reports that he drinks alcohol. He reports that he does not use illicit drugs.   Family History:  The patient's family history includes Heart attack in his father; Hypertension in his father; Stroke in his father.    ROS:  Please see the history of present illness.   Otherwise, review of systems are positive for none.   All other systems are reviewed and negative.    PHYSICAL EXAM: VS:  BP 130/80 mmHg  Pulse 82  Ht  (1.753 m)  Wt 167 lb 12.8 oz (76.114 kg)  BMI 24.77 kg/m2  SpO2 99% , BMI Body mass index is 24.77 kg/(m^2). GEN: Well nourished, well developed, in no acute distress HEENT: normal Neck: no JVD, carotid bruits, or masses Cardiac: RRR; no murmurs, rubs, or gallops,no edema  Respiratory:  clear to auscultation bilaterally, normal work of breathing GI: soft,  nontender, nondistended, + BS MS: no deformity or atrophy Skin: warm and dry, no rash Neuro:  Strength and sensation are intact Psych: euthymic mood, full affect   EKG:  EKG was not ordered today.    Recent Labs: 10/18/2013: Magnesium 2.4 10/30/2013: TSH 0.32* 02/11/2014: BUN 14; Creatinine 0.90; Hemoglobin 15.0; Platelets 322; Potassium 3.7; Sodium 138 03/05/2014: ALT 21    Lipid Panel    Component Value Date/Time   CHOL 146 03/05/2014 0843   TRIG 148.0 03/05/2014 0843   HDL 44.40 03/05/2014 0843   CHOLHDL 3 03/05/2014 0843   VLDL 29.6 03/05/2014 0843   LDLCALC 72 03/05/2014 0843      Wt Readings from Last 3 Encounters:  06/29/14 167 lb 12.8 oz (76.114 kg)  04/04/14 169 lb (76.658 kg)  01/08/14 166 lb (75.297 kg)      ASSESSMENT AND PLAN:  1. Chest pain, unspecified chest pain type - stress myoview with no ischemia.  He continues to have chest pain that is identical to his pain he had with his MI.  It is usually exertional when he is walking fast.  He left arm will go numb with the pain.  His lesion was in a ramus branch which may not show up on nuclear stress test.  He has had to stop working some due to pain.  I think at this point we should bring him back to the cath lab to assess the patency of the RI stent.  He had no other significant CAD on cath last fall.   2. Coronary artery disease with remote NSTEMI: S/P PCI of RI - Continue aspirin, Brilinta, statin, beta blocker. 3. Essential hypertension: Controlled. Continue BB/ACE I/ diuretic 4. HLD (hyperlipidemia): Continue statin/Zetia. LDL near goal .  5. Former Cigarette Smoker: He has stopped smoking.   Current medicines are reviewed at length with the patient today.  The patient does not have concerns regarding medicines.  The following changes have been made:  no change  Labs/ tests ordered today include: none  No orders of the defined types were placed in this encounter.     Disposition:   FU  with me in 6 months  Signed, Quintella Reichert, MD  06/29/2014 9:33 AM    [facility] [address] [place], [place], [place]  [address] Phone: [phone]; Fax: [phone]

## 2014-07-03 NOTE — Research (Signed)
TWLIGHT Informed Consent   Subject Name: Joseph Carr  Subject met inclusion and exclusion criteria.  The informed consent form, study requirements and expectations were reviewed with the subject and questions and concerns were addressed prior to the signing of the consent form.  The subject verbalized understanding of the trail requirements.  The subject agreed to participate in the [facility] trial and signed the informed consent.  The informed consent was obtained prior to performance of any protocol-specific procedures for the subject.  A copy of the signed informed consent was given to the subject and a copy was placed in the subject's medical record.  Hedrick,Tammy W 07/03/2014,  [id number]

## 2014-07-06 ENCOUNTER — Other Ambulatory Visit: Payer: Self-pay | Admitting: Cardiology

## 2014-07-07 ENCOUNTER — Other Ambulatory Visit: Payer: Self-pay | Admitting: *Deleted

## 2014-07-07 DIAGNOSIS — I2510 Atherosclerotic heart disease of native coronary artery without angina pectoris: Secondary | ICD-10-CM

## 2014-07-07 DIAGNOSIS — Z9861 Coronary angioplasty status: Principal | ICD-10-CM

## 2014-07-07 MED ORDER — AMBULATORY NON FORMULARY MEDICATION: 90.0000 mg | Freq: Two times a day (BID) | Status: DC

## 2014-07-07 MED ORDER — AMBULATORY NON FORMULARY MEDICATION: 81.0000 mg | Freq: Every day | Status: DC

## 2014-07-07 MED FILL — Heparin Sodium (Porcine) 2 Unit/ML in Sodium Chloride 0.9%: INTRAMUSCULAR | Qty: 1500 | Status: AC

## 2014-07-08 NOTE — Progress Notes (Signed)
Cardiology Office Note   Date:  07/09/2014   ID:  Joseph Carr, DOB [birth year], MRN [id number]  PCP:  Joseph Rainwater, MD  Cardiologist:  Dr. Armanda Carr     Chief Complaint  Patient presents with  . Coronary Artery Disease     History of Present Illness: Joseph Carr is a 44 y.o. male with a hx of CAD, HTN, tobacco abuse, HL. He was admitted 9/15 with a non-STEMI. He underwent DES to the ramus intermediate. He had recurrent chest pain in 12/2013 and a Myoview was low risk with no ischemia.  He was last seen by Dr. Armanda Carr 06/29/14 and continued to complain of exertional anginal symptoms.  Cardiac cath was arranged.  LHC demonstrated a patent stent in the RI with a 2nd 90% lesion in the RI that was treated with a DES.  Post PCI course was uneventful.  He returns for FU.    He is feeling much better.  The patient denies chest pain, shortness of breath, syncope, orthopnea, PND or significant pedal edema.  He is already back at work and doing well.    Studies/Reports Reviewed Today:  LHC/PCI 07/03/14 Conclusion     Prox Cx lesion, 40% stenosed.  Prox LAD lesion, 15% stenosed.  DES in ramus intermediate is patent  Ramus-2 lesion, 90% stenosed just distal to prior stent. There is a 0% residual stenosis post intervention.  A drug-eluting stent was placed. To RI (Promus Premier DES 2.5x12 mm)  Normal LV function. EF 55-65%  Recommendation: Continue DAPT. Anticipate DC later today    Myoview 12/2013 Myoview scan with inferior/inferolateral fixed defect consistent with soft tissue attenuation and / or subendocardial scar No significatn ischemia. Overall low to intermediate risk scan.  LV Ejection Fraction: 51%. LV Wall Motion: Normal wall thickening  LHC (9/15):  EF 55%,Proximal RI 60-70% >> FFR 0.74 >> PCI: Promus 2.5 x 20 mm DES to the RI   Past Medical History  Diagnosis Date  . Hypertension   . CAD (coronary artery disease)     a. s/p  STEMI 10/2013 - DES to ramus intermedius. Patent LM, LAD, RCA, EF 55%;  b. 06/2014 Cath/PCI: LM nl, LAD 15p, RI patent stent prox, 60m (2.5x12 Promus Premier DES), LCX 40p, EF 55-65%.  . Hyperlipidemia LDL goal <70   . Low TSH level   . Tobacco abuse   . Hx of cardiovascular stress test     ETT/Lexiscan Myoview (11/15):  Inferior/inferolateral fixed defect c/w soft tissue attenuation and / or subendocardial scar No significant ischemia. Overall low to intermediate risk scan.LV Ejection Fraction: 51%.    Past Surgical History  Procedure Laterality Date  . Finger fracture surgery Left 2014    4th finger  . Left heart catheterization with coronary angiogram N/A 10/20/2013    Procedure: LEFT HEART CATHETERIZATION WITH CORONARY ANGIOGRAM;  Surgeon: Corky Crafts, MD;  Location: [facility] CATH LAB;  Service: Cardiovascular;  Laterality: N/A;  . Cardiac catheterization N/A 07/03/2014    Procedure: Left Heart Cath and Coronary Angiography;  Surgeon: Peter M Swaziland, MD;  Location: [facility] INVASIVE CV LAB;  Service: Cardiovascular;  Laterality: N/A;  . Cardiac catheterization N/A 07/03/2014    Procedure: Coronary Stent Intervention;  Surgeon: Peter M Swaziland, MD;  Location: [facility] INVASIVE CV LAB;  Service: Cardiovascular;  Laterality: N/A;  . Percutaneous coronary stent intervention (pci-s)  07/03/2014    RAMUS       Current Outpatient Prescriptions  Medication Sig Dispense  Refill  . AMBULATORY NON FORMULARY MEDICATION Take 90 mg by mouth 2 (two) times daily. Medication Name:  Ticagrelor  90 mg BID provided by Research TWILIGHT Study    . AMBULATORY NON FORMULARY MEDICATION Take 81 mg by mouth daily. Medication Name: Aspirin 81 mg provided by the TWILIGHT research study    . atorvastatin (LIPITOR) 80 MG tablet TAKE ONE TABLET BY MOUTH ONCE DAILY AT  6PM 30 tablet 5  . ezetimibe (ZETIA) 10 MG tablet Take 1 tablet (10 mg total) by mouth daily. 90 tablet 3  . lisinopril-hydrochlorothiazide (PRINZIDE,ZESTORETIC)  20-25 MG per tablet Take 1 tablet by mouth daily. 30 tablet 2  . metoprolol tartrate (LOPRESSOR) 25 MG tablet TAKE ONE TABLET BY MOUTH TWICE DAILY 60 tablet 6  . nitroGLYCERIN (NITROSTAT) 0.4 MG SL tablet Place 1 tablet (0.4 mg total) under the tongue every 5 (five) minutes x 3 doses as needed for chest pain. 25 tablet 4   No current facility-administered medications for this visit.    Allergies:   Review of patient's allergies indicates no known allergies.    Social History:  The patient  reports that he quit smoking 6 days ago. His smoking use included Cigarettes. He has a 22.5 pack-year smoking history. He has never used smokeless tobacco. He reports that he drinks alcohol. He reports that he does not use illicit drugs.   Family History:  The patient's family history includes Heart attack in his father; Hypertension in his father; Stroke in his father.    ROS:   Please see the history of present illness.   Review of Systems  Cardiovascular: Positive for irregular heartbeat.  All other systems reviewed and are negative.    PHYSICAL EXAM: VS:  BP 120/70 mmHg  Pulse 72  Ht 5\' 9"  (1.753 m)  Wt 168 lb (76.204 kg)  BMI 24.80 kg/m2    Wt Readings from Last 3 Encounters:  07/09/14 168 lb (76.204 kg)  07/03/14 167 lb (75.751 kg)  06/29/14 167 lb 12.8 oz (76.114 kg)     GEN: Well nourished, well developed, in no acute distress HEENT: normal Neck: no JVD,   no masses Cardiac:  Normal S1/S2, RRR; no murmur,  no rubs or gallops, no edema; right wrist without hematoma or mass  Respiratory:  clear to auscultation bilaterally, no wheezing, rhonchi or rales. GI: soft, nontender, nondistended, + BS MS: no deformity or atrophy Skin: warm and dry  Neuro:  CNs II-XII intact, Strength and sensation are intact Psych: Normal affect   EKG:  EKG is ordered today.  It demonstrates:   NSR, HR 72, normal axis, J-point elevation, LVH, no change from prior tracing   Recent Labs: 10/18/2013:  Magnesium 2.4 10/30/2013: TSH 0.32* 03/05/2014: ALT 21 06/29/2014: BUN 13; Creatinine 1.03; Hemoglobin 14.8; Platelets 260.0; Potassium 4.3; Sodium 136    Lipid Panel    Component Value Date/Time   CHOL 146 03/05/2014 0843   TRIG 148.0 03/05/2014 0843   HDL 44.40 03/05/2014 0843   CHOLHDL 3 03/05/2014 0843   VLDL 29.6 03/05/2014 0843   LDLCALC 72 03/05/2014 0843      ASSESSMENT AND PLAN:  Coronary artery disease involving native coronary artery of native heart without angina pectoris:  Doing well after recent PCI to the RI with a DES.  We discussed the importance of dual antiplatelet therapy. He receives his Brilinta and ASA from the [employer] [facility].  He is in the TWILIGHT study.  Continue ASA, Brilinta, beta-blocker, ACE inhibitor.  Essential hypertension:  Controlled.   Hyperlipidemia LDL goal <70:  Recent LDL at goal.  Continue Lipitor and Zetia.   Tobacco abuse:  He has quit smoking.    Current medicines are reviewed at length with the patient today.  Concerns regarding medicines are as outlined above.  The following changes have been made:    None    Labs/ tests ordered today include:    Orders Placed This Encounter  Procedures  . EKG 12-Lead    Disposition:   FU with Dr. Armanda Carr 3 mos.    Signed, Brynda Rim, MHS 07/09/2014 10:24 AM    [facility] [address] [place], [place], [place]  [address] Phone: [phone]; Fax: [phone]

## 2014-07-09 ENCOUNTER — Ambulatory Visit (INDEPENDENT_AMBULATORY_CARE_PROVIDER_SITE_OTHER): Payer: BLUE CROSS/BLUE SHIELD | Admitting: Physician Assistant

## 2014-07-09 ENCOUNTER — Encounter: Payer: Self-pay | Admitting: Physician Assistant

## 2014-07-09 VITALS — BP 120/70 | HR 72 | Ht 69.0 in | Wt 168.0 lb

## 2014-07-09 DIAGNOSIS — I10 Essential (primary) hypertension: Secondary | ICD-10-CM | POA: Diagnosis not present

## 2014-07-09 DIAGNOSIS — E785 Hyperlipidemia, unspecified: Secondary | ICD-10-CM | POA: Diagnosis not present

## 2014-07-09 DIAGNOSIS — I2510 Atherosclerotic heart disease of native coronary artery without angina pectoris: Secondary | ICD-10-CM

## 2014-07-09 NOTE — Patient Instructions (Signed)
Medication Instructions:  Your physician recommends that you continue on your current medications as directed. Please refer to the Current Medication list given to you today.   Labwork: NONE  Testing/Procedures: NONE  Follow-Up: DR. Mayford KnifeURNER 10/16/14 1:30  Any Other Special Instructions Will Be Listed Below (If Applicable).

## 2014-10-16 ENCOUNTER — Ambulatory Visit (INDEPENDENT_AMBULATORY_CARE_PROVIDER_SITE_OTHER): Payer: BLUE CROSS/BLUE SHIELD | Admitting: Cardiology

## 2014-10-16 ENCOUNTER — Encounter: Payer: Self-pay | Admitting: Cardiology

## 2014-10-16 VITALS — BP 120/74 | HR 84 | Ht 69.0 in | Wt 168.4 lb

## 2014-10-16 DIAGNOSIS — I10 Essential (primary) hypertension: Secondary | ICD-10-CM | POA: Diagnosis not present

## 2014-10-16 DIAGNOSIS — I2510 Atherosclerotic heart disease of native coronary artery without angina pectoris: Secondary | ICD-10-CM

## 2014-10-16 DIAGNOSIS — E785 Hyperlipidemia, unspecified: Secondary | ICD-10-CM

## 2014-10-16 DIAGNOSIS — Z9861 Coronary angioplasty status: Secondary | ICD-10-CM

## 2014-10-16 NOTE — Patient Instructions (Signed)
Medication Instructions: - no changes  Labwork: - Your physician recommends that you return for FASTING lab work next week: BMP/ liver/ lipid  Procedures/Testing: - none  Follow-Up: - Your physician wants you to follow-up in: 6 months with Dr. Mayford Knife. You will receive a reminder letter in the mail two months in advance. If you don't receive a letter, please call our office to schedule the follow-up appointment.   Any Additional Special Instructions Will Be Listed Below (If Applicable).

## 2014-10-16 NOTE — Progress Notes (Signed)
Cardiology Office Note   Date:  10/16/2014   ID:  Joseph Carr, DOB 30-Aug-1970, MRN [id number]  PCP:  Tommy Rainwater, MD    Chief Complaint  Patient presents with  . CAD      History of Present Illness: Joseph Carr is a 44 y.o. male with a history of CAD s/p NSTEMI 10/2013 s/p PCI with DES to RI, HTN, tobacco abuse, HL.When I last saw him he continued to have chest pain identical to his pain he had with his MI.  Repeat cath showed prox Cx lesion 40% stenosed, prox LAD lesion 15% stenosed, DES in ramus intermediate is patent, Ramus-2 lesion 90% stenosed just distal to prior stent and underwent drug-eluting stent.  Since his PCI he has not had any further CP and denies any SOB.  He denies significant dyspnea, orthopnea, PND, edema. He denies syncope. He denies SOB, dizziness, palpitations or syncope.     Past Medical History  Diagnosis Date  . Hypertension   . Hyperlipidemia LDL goal <70   . Low TSH level   . Tobacco abuse   . Hx of cardiovascular stress test     ETT/Lexiscan Myoview (11/15):  Inferior/inferolateral fixed defect c/w soft tissue attenuation and / or subendocardial scar No significant ischemia. Overall low to intermediate risk scan.LV Ejection Fraction: 51%.  Marland Kitchen CAD (coronary artery disease)     a. s/p STEMI 10/2013 - DES to ramus intermedius. Patent LM, LAD, RCA, EF 55%;  b. 06/2014 Cath/PCI: LM nl, LAD 15p, RI patent stent prox, 61m (2.5x12 Promus Premier DES), LCX 40p, EF 55-65%.    Past Surgical History  Procedure Laterality Date  . Finger fracture surgery Left 2014    4th finger  . Left heart catheterization with coronary angiogram N/A 10/20/2013    Procedure: LEFT HEART CATHETERIZATION WITH CORONARY ANGIOGRAM;  Surgeon: Corky Crafts, MD;  Location: [facility] CATH LAB;  Service: Cardiovascular;  Laterality: N/A;  . Cardiac catheterization N/A 07/03/2014    Procedure: Left Heart Cath and Coronary Angiography;  Surgeon:  Peter M Swaziland, MD;  Location: [facility] INVASIVE CV LAB;  Service: Cardiovascular;  Laterality: N/A;  . Cardiac catheterization N/A 07/03/2014    Procedure: Coronary Stent Intervention;  Surgeon: Peter M Swaziland, MD;  Location: [facility] INVASIVE CV LAB;  Service: Cardiovascular;  Laterality: N/A;  . Percutaneous coronary stent intervention (pci-s)  07/03/2014    RAMUS    . Coronary angioplasty       Current Outpatient Prescriptions  Medication Sig Dispense Refill  . AMBULATORY NON FORMULARY MEDICATION Take 90 mg by mouth 2 (two) times daily. Medication Name:  Ticagrelor  90 mg BID provided by Research TWILIGHT Study    . AMBULATORY NON FORMULARY MEDICATION Take 81 mg by mouth daily. Medication Name: Aspirin 81 mg provided by the TWILIGHT research study    . atorvastatin (LIPITOR) 80 MG tablet TAKE ONE TABLET BY MOUTH ONCE DAILY AT  6PM 30 tablet 5  . ezetimibe (ZETIA) 10 MG tablet Take 1 tablet (10 mg total) by mouth daily. 90 tablet 3  . lisinopril-hydrochlorothiazide (PRINZIDE,ZESTORETIC) 20-25 MG per tablet Take 1 tablet by mouth daily. 30 tablet 2  . metoprolol tartrate (LOPRESSOR) 25 MG tablet TAKE ONE TABLET BY MOUTH TWICE DAILY 60 tablet 6  . nitroGLYCERIN (NITROSTAT) 0.4 MG SL tablet Place 0.4 mg under the tongue every 5 (five) minutes as needed for chest  pain.     No current facility-administered medications for this visit.    Allergies:   Review of patient's allergies indicates no known allergies.    Social History:  The patient  reports that he quit smoking about 3 months ago. His smoking use included Cigarettes. He has a 22.5 pack-year smoking history. He has never used smokeless tobacco. He reports that he drinks alcohol. He reports that he does not use illicit drugs.   Family History:  The patient's family history includes Heart attack in his father; Hypertension in his father; Stroke in his father.    ROS:  Please see the history of present illness.   Otherwise, review of systems are  positive for none.   All other systems are reviewed and negative.    PHYSICAL EXAM: VS:  BP 120/74 mmHg  Pulse 84  Ht  (1.753 m)  Wt 168 lb 6.4 oz (76.386 kg)  BMI 24.86 kg/m2  SpO2 98% , BMI Body mass index is 24.86 kg/(m^2). GEN: Well nourished, well developed, in no acute distress HEENT: normal Neck: no JVD, carotid bruits, or masses Cardiac: RRR; no murmurs, rubs, or gallops,no edema  Respiratory:  clear to auscultation bilaterally, normal work of breathing GI: soft, nontender, nondistended, + BS MS: no deformity or atrophy Skin: warm and dry, no rash Neuro:  Strength and sensation are intact Psych: euthymic mood, full affect   EKG:  EKG is not ordered today.   Recent Labs: 10/18/2013: Magnesium 2.4 10/30/2013: TSH 0.32* 03/05/2014: ALT 21 06/29/2014: BUN 13; Creatinine, Ser 1.03; Hemoglobin 14.8; Platelets 260.0; Potassium 4.3; Sodium 136    Lipid Panel    Component Value Date/Time   CHOL 146 03/05/2014 0843   TRIG 148.0 03/05/2014 0843   HDL 44.40 03/05/2014 0843   CHOLHDL 3 03/05/2014 0843   VLDL 29.6 03/05/2014 0843   LDLCALC 72 03/05/2014 0843      Wt Readings from Last 3 Encounters:  10/16/14 168 lb 6.4 oz (76.386 kg)  07/09/14 168 lb (76.204 kg)  07/03/14 167 lb (75.751 kg)    ASSESSMENT AND PLAN: 1.  Chest pain - resolved after PCI of ramus-2 lesion 06/2014.     2. Coronary artery disease with remote NSTEMI: S/P PCI of RI.  Repeat cath 06/2014 showed prox Cx lesion 40% stenosed, prox LAD lesion 15% stenosed, DES in ramus intermediate is patent, Ramus-2 lesion 90% stenosed just distal to prior stent and underwent drug-eluting stent. - Continue aspirin, Brilinta, statin, beta blocker. 3. Essential hypertension: Controlled. Continue BB/ACE I/ diuretic.  Check BMET 4. HLD (hyperlipidemia): Continue statin/Zetia. LDL near goal . Check FLP and ALT 5. Former Cigarette Smoker: He has stopped smoking   Current medicines are reviewed at length  with the patient today.  The patient does not have concerns regarding medicines.  The following changes have been made:  no change  Labs/ tests ordered today: See above Assessment and Plan No orders of the defined types were placed in this encounter.     Disposition:   FU with me in 6 months  Signed, Quintella Reichert, MD  10/16/2014 1:44 PM    [facility] [address] [place], [place], [place]  [address] Phone: [phone]; Fax: [phone]

## 2014-10-22 ENCOUNTER — Telehealth: Payer: Self-pay | Admitting: *Deleted

## 2014-10-22 ENCOUNTER — Other Ambulatory Visit: Payer: Self-pay | Admitting: *Deleted

## 2014-10-22 NOTE — Telephone Encounter (Signed)
Called subject's work number.  Left message at patient's place of business.  Number provided for return call to [employer] in reference to TWILIGHT study drug.  Multiple attempts to reach patient at home and on cell unsuccessful.

## 2014-10-22 NOTE — Telephone Encounter (Signed)
Multiple attempts to reach patient at home and cell phone with no response.  Left message on voicemail regarding importance of TWILIGHT study drug appointment.  Will call work number.

## 2014-10-23 ENCOUNTER — Other Ambulatory Visit (INDEPENDENT_AMBULATORY_CARE_PROVIDER_SITE_OTHER): Payer: BLUE CROSS/BLUE SHIELD | Admitting: *Deleted

## 2014-10-23 ENCOUNTER — Encounter: Payer: Self-pay | Admitting: *Deleted

## 2014-10-23 DIAGNOSIS — I2510 Atherosclerotic heart disease of native coronary artery without angina pectoris: Secondary | ICD-10-CM | POA: Diagnosis not present

## 2014-10-23 DIAGNOSIS — I10 Essential (primary) hypertension: Secondary | ICD-10-CM | POA: Diagnosis not present

## 2014-10-23 DIAGNOSIS — Z9861 Coronary angioplasty status: Secondary | ICD-10-CM | POA: Diagnosis not present

## 2014-10-23 DIAGNOSIS — Z006 Encounter for examination for normal comparison and control in clinical research program: Secondary | ICD-10-CM

## 2014-10-23 DIAGNOSIS — E785 Hyperlipidemia, unspecified: Secondary | ICD-10-CM | POA: Diagnosis not present

## 2014-10-23 LAB — HEPATIC FUNCTION PANEL
ALK PHOS: 57 U/L (ref 39–117)
ALT: 24 U/L (ref 0–53)
AST: 28 U/L (ref 0–37)
Albumin: 4.4 g/dL (ref 3.5–5.2)
Bilirubin, Direct: 0.1 mg/dL (ref 0.0–0.3)
TOTAL PROTEIN: 7.6 g/dL (ref 6.0–8.3)
Total Bilirubin: 0.4 mg/dL (ref 0.2–1.2)

## 2014-10-23 LAB — BASIC METABOLIC PANEL WITH GFR
BUN: 13 mg/dL (ref 6–23)
CO2: 25 meq/L (ref 19–32)
Calcium: 9.2 mg/dL (ref 8.4–10.5)
Chloride: 105 meq/L (ref 96–112)
Creatinine, Ser: 1.02 mg/dL (ref 0.40–1.50)
GFR: 101.43 mL/min
Glucose, Bld: 82 mg/dL (ref 70–99)
Potassium: 3.9 meq/L (ref 3.5–5.1)
Sodium: 139 meq/L (ref 135–145)

## 2014-10-23 LAB — LIPID PANEL
Cholesterol: 154 mg/dL (ref 0–200)
HDL: 53.7 mg/dL (ref >39.00)
LDL Cholesterol: 86 mg/dL (ref 0–99)
NonHDL: 100.1
TRIGLYCERIDES: 69 mg/dL (ref 0.0–149.0)
Total CHOL/HDL Ratio: 3
VLDL: 13.8 mg/dL (ref 0.0–40.0)

## 2014-10-23 NOTE — Progress Notes (Signed)
TWILIGHT 3 month appointment original scheduled for 10/16/14 was rescheduled for today due to study drug shipment delayed (per study contact).Randomization window extension granted by Dr. Carolin Guernsey [facility] Medical monitor).  Patient states he may have missed "a few doses of Brilinta and ASA". Zero pills remaining in returned study bottles. Patient states he had not experienced any bleeding events or other adverse events. Study drug compliance emphasized and pt verbalizes understanding. Copy of follow up window given to patient questions encouraged and answered.

## 2014-10-30 ENCOUNTER — Telehealth: Payer: Self-pay | Admitting: Cardiology

## 2014-10-30 NOTE — Telephone Encounter (Signed)
Pt rtn call to Katie from 10-29-14-pls call after 1p aaaat [phone]

## 2014-10-30 NOTE — Telephone Encounter (Signed)
Left message to call back  

## 2014-10-30 NOTE — Telephone Encounter (Signed)
Informed pt of lab results. Pt verbalized understanding. 

## 2014-11-02 ENCOUNTER — Emergency Department (HOSPITAL_COMMUNITY)
Admission: EM | Admit: 2014-11-02 | Discharge: 2014-11-03 | Disposition: A | Discharge status: Home / Self Care | Payer: BLUE CROSS/BLUE SHIELD | Attending: Emergency Medicine | Admitting: Emergency Medicine

## 2014-11-02 ENCOUNTER — Encounter (HOSPITAL_COMMUNITY): Payer: Self-pay | Admitting: Emergency Medicine

## 2014-11-02 DIAGNOSIS — I2510 Atherosclerotic heart disease of native coronary artery without angina pectoris: Secondary | ICD-10-CM | POA: Diagnosis not present

## 2014-11-02 DIAGNOSIS — Y998 Other external cause status: Secondary | ICD-10-CM | POA: Insufficient documentation

## 2014-11-02 DIAGNOSIS — I10 Essential (primary) hypertension: Secondary | ICD-10-CM | POA: Insufficient documentation

## 2014-11-02 DIAGNOSIS — Z87891 Personal history of nicotine dependence: Secondary | ICD-10-CM | POA: Diagnosis not present

## 2014-11-02 DIAGNOSIS — T783XXA Angioneurotic edema, initial encounter: Secondary | ICD-10-CM | POA: Diagnosis not present

## 2014-11-02 DIAGNOSIS — X58XXXA Exposure to other specified factors, initial encounter: Secondary | ICD-10-CM | POA: Diagnosis not present

## 2014-11-02 DIAGNOSIS — Y9289 Other specified places as the place of occurrence of the external cause: Secondary | ICD-10-CM | POA: Diagnosis not present

## 2014-11-02 DIAGNOSIS — Z9889 Other specified postprocedural states: Secondary | ICD-10-CM | POA: Diagnosis not present

## 2014-11-02 DIAGNOSIS — Z79899 Other long term (current) drug therapy: Secondary | ICD-10-CM | POA: Insufficient documentation

## 2014-11-02 DIAGNOSIS — T464X5A Adverse effect of angiotensin-converting-enzyme inhibitors, initial encounter: Secondary | ICD-10-CM

## 2014-11-02 DIAGNOSIS — Y9389 Activity, other specified: Secondary | ICD-10-CM | POA: Insufficient documentation

## 2014-11-02 DIAGNOSIS — E785 Hyperlipidemia, unspecified: Secondary | ICD-10-CM | POA: Diagnosis not present

## 2014-11-02 DIAGNOSIS — Z9861 Coronary angioplasty status: Secondary | ICD-10-CM | POA: Insufficient documentation

## 2014-11-02 DIAGNOSIS — R220 Localized swelling, mass and lump, head: Secondary | ICD-10-CM | POA: Diagnosis present

## 2014-11-02 MED ORDER — PREDNISONE 20 MG PO TABS
60.0000 mg | ORAL_TABLET | Freq: Once | ORAL | Status: AC
  Administered 2014-11-03: 60 mg via ORAL
  Filled 2014-11-02: qty 3

## 2014-11-02 MED ORDER — FAMOTIDINE 20 MG PO TABS
20.0000 mg | ORAL_TABLET | Freq: Once | ORAL | Status: AC
  Administered 2014-11-03: 20 mg via ORAL
  Filled 2014-11-02: qty 1

## 2014-11-02 NOTE — ED Notes (Signed)
Pt. presents with upper/lower lip swelling onset this morning , denies injury , no tongue swelling /respirations unlabored .

## 2014-11-03 ENCOUNTER — Other Ambulatory Visit: Payer: Self-pay | Admitting: *Deleted

## 2014-11-03 ENCOUNTER — Encounter (HOSPITAL_COMMUNITY): Payer: Self-pay | Admitting: Emergency Medicine

## 2014-11-03 MED ORDER — AMBULATORY NON FORMULARY MEDICATION: 81.0000 mg | Freq: Every day | Status: DC

## 2014-11-03 MED ORDER — PREDNISONE 20 MG PO TABS: ORAL_TABLET | ORAL | Status: DC

## 2014-11-03 NOTE — ED Notes (Signed)
PO challenge started pt swallow fluids without coughing or discomfort. MD to be notified.

## 2014-11-03 NOTE — ED Provider Notes (Signed)
CSN: [id number]     Arrival date & time 11/02/14  2229 History  By signing my name below, I, Evon Slack, attest that this documentation has been prepared under the direction and in the presence of April Palumbo, MD. Electronically Signed: Evon Slack, ED Scribe. 11/03/2014. 12:33 AM.      Chief Complaint  Patient presents with  . Angioedema   Patient is a 44 y.o. male presenting with allergic reaction. The history is provided by the patient. No language interpreter was used.  Allergic Reaction Presenting symptoms: swelling   Presenting symptoms: no difficulty swallowing and no itching   Severity:  Moderate Prior allergic episodes:  No prior episodes Context: medications   Relieved by:  Nothing Worsened by:  Nothing tried Ineffective treatments:  None tried  HPI Comments: Joseph Carr is a 44 y.o. male who presents to the Emergency Department complaining of facial swelling onset yesterday morning at 7:45 AM. Pt states that the swelling is located in her upper and lower lips. Pt states that he is currently on lisinopril. Since being in the ED he has had prednisone with no relief. Pt denies trouble swallowing, drooling or other related symptoms.   Past Medical History  Diagnosis Date  . Hypertension   . Hyperlipidemia LDL goal <70   . Low TSH level   . Tobacco abuse   . Hx of cardiovascular stress test     ETT/Lexiscan Myoview (11/15):  Inferior/inferolateral fixed defect c/w soft tissue attenuation and / or subendocardial scar No significant ischemia. Overall low to intermediate risk scan.LV Ejection Fraction: 51%.  Marland Kitchen CAD (coronary artery disease)     a. s/p STEMI 10/2013 - DES to ramus intermedius. Patent LM, LAD, RCA, EF 55%;  b. 06/2014 Cath/PCI: LM nl, LAD 15p, RI patent stent prox, 49m (2.5x12 Promus Premier DES), LCX 40p, EF 55-65%.   Past Surgical History  Procedure Laterality Date  . Finger fracture surgery Left 2014    4th finger  . Left heart catheterization  with coronary angiogram N/A 10/20/2013    Procedure: LEFT HEART CATHETERIZATION WITH CORONARY ANGIOGRAM;  Surgeon: Corky Crafts, MD;  Location: [facility] CATH LAB;  Service: Cardiovascular;  Laterality: N/A;  . Cardiac catheterization N/A 07/03/2014    Procedure: Left Heart Cath and Coronary Angiography;  Surgeon: Peter M Swaziland, MD;  Location: [facility] INVASIVE CV LAB;  Service: Cardiovascular;  Laterality: N/A;  . Cardiac catheterization N/A 07/03/2014    Procedure: Coronary Stent Intervention;  Surgeon: Peter M Swaziland, MD;  Location: [facility] INVASIVE CV LAB;  Service: Cardiovascular;  Laterality: N/A;  . Percutaneous coronary stent intervention (pci-s)  07/03/2014    RAMUS    . Coronary angioplasty     Family History  Problem Relation Age of Onset  . Heart attack Father   . Hypertension Father   . Stroke Father    Social History  Substance Use Topics  . Smoking status: Former Smoker -- 0.75 packs/day for 30 years    Types: Cigarettes    Quit date: 07/03/2014  . Smokeless tobacco: Never Used  . Alcohol Use: Yes    Review of Systems  HENT: Positive for facial swelling. Negative for drooling and trouble swallowing.   Skin: Negative for itching.  All other systems reviewed and are negative.    Allergies  Review of patient's allergies indicates no known allergies.  Home Medications   Prior to Admission medications   Medication Sig Start Date End Date Taking? Authorizing Provider  AMBULATORY NON FORMULARY  MEDICATION Take 90 mg by mouth 2 (two) times daily. Medication Name:  Ticagrelor  90 mg BID provided by Research TWILIGHT Study 07/07/14   Corky Crafts, MD  AMBULATORY NON FORMULARY MEDICATION Take 81 mg by mouth daily. Medication Name: Aspirin 81 mg provided by the TWILIGHT research study 07/03/14   Corky Crafts, MD  atorvastatin (LIPITOR) 80 MG tablet TAKE ONE TABLET BY MOUTH ONCE DAILY AT  [facility] 07/07/14   Quintella Reichert, MD  ezetimibe (ZETIA) 10 MG tablet Take 1 tablet (10 mg  total) by mouth daily. 01/08/14   Quintella Reichert, MD  lisinopril-hydrochlorothiazide (PRINZIDE,ZESTORETIC) 20-25 MG per tablet Take 1 tablet by mouth daily. 08/23/12   Reuben Likes, MD  metoprolol tartrate (LOPRESSOR) 25 MG tablet TAKE ONE TABLET BY MOUTH TWICE DAILY 05/21/14   Quintella Reichert, MD  nitroGLYCERIN (NITROSTAT) 0.4 MG SL tablet Place 0.4 mg under the tongue every 5 (five) minutes as needed for chest pain.    Historical Provider, MD   BP 112/86 mmHg  Pulse 84  Temp(Src) 98 F (36.7 C) (Oral)  Resp 14  Ht  (1.753 m)  Wt 160 lb (72.576 kg)  BMI 23.62 kg/m2  SpO2 100%   Physical Exam  Constitutional: He is oriented to person, place, and time. He appears well-developed and well-nourished. No distress.  HENT:  Head: Normocephalic and atraumatic.  Mouth/Throat: Uvula is midline, oropharynx is clear and moist and mucous membranes are normal. No uvula swelling. No oropharyngeal exudate.  No swelling of tongue or uvula. Swelling of upper and lower lip. Trachea midline.   Eyes: Conjunctivae and EOM are normal. Pupils are equal, round, and reactive to light.  Neck: Normal range of motion. Neck supple. No tracheal deviation present.  Cardiovascular: Normal rate, regular rhythm and normal heart sounds.   Pulmonary/Chest: Effort normal and breath sounds normal. No stridor. No respiratory distress. He has no wheezes. He has no rales.  Abdominal: Soft. Bowel sounds are normal. There is no tenderness. There is no rebound and no guarding.  Musculoskeletal: Normal range of motion.  Neurological: He is alert and oriented to person, place, and time.  Skin: Skin is warm and dry.  Psychiatric: He has a normal mood and affect. His behavior is normal.  Nursing note and vitals reviewed.   ED Course  Procedures (including critical care time) DIAGNOSTIC STUDIES: Oxygen Saturation is 100% on RA, normal by my interpretation.    COORDINATION OF CARE: 12:29 AM-Discussed treatment plan with pt  at bedside and pt agreed to plan.     Labs Review Labs Reviewed - No data to display  Imaging Review No results found.    EKG Interpretation None      MDM   Final diagnoses:  None    No additional swelling lower lip improving slightly call your doctor and tell them of your allergy to lisinopril and have them change you to a new class of medication.  Return for worsening swelling wheezing or shortness of breath   I personally performed the services described in this documentation, which was scribed in my presence. The recorded information has been reviewed and is accurate.      Cy Blamer, MD 11/03/14 0157

## 2014-11-03 NOTE — Discharge Instructions (Signed)
Angioedema °Angioedema is sudden puffiness (swelling), often of the skin. It can happen: °· On your face or privates (genitals). °· In your belly (abdomen) or other body parts. °It usually happens quickly and gets better in 1 or 2 days. It often starts at night and is found when you wake up. You may get red, itchy patches of skin (hives). Attacks can be dangerous if your breathing passages get puffy. °The condition may happen only once, or it can come back at random times. It may happen for several years before it goes away for good. °HOME CARE °· Only take medicines as told by your doctor. °· Always carry your emergency allergy medicines with you. °· Wear a medical bracelet as told by your doctor. °· Avoid things that you know will cause attacks (triggers). °GET HELP IF: °· You have another attack. °· Your attacks happen more often or get worse. °· The condition was passed to you by your parents and you want to have children. °GET HELP RIGHT AWAY IF:  °· Your mouth, tongue, or lips are very puffy. °· You have trouble breathing. °· You have trouble swallowing. °· You pass out (faint). °MAKE SURE YOU:  °· Understand these instructions. °· Will watch your condition. °· Will get help right away if you are not doing well or get worse. °Document Released: 01/11/2009 Document Revised: 11/13/2012 Document Reviewed: 09/16/2012 °ExitCare® Patient Information ©2015 [employer]. This information is not intended to replace advice given to you by your health care provider. Make sure you discuss any questions you have with your health care provider. ° °

## 2014-11-06 ENCOUNTER — Encounter: Payer: Self-pay | Admitting: *Deleted

## 2014-11-06 DIAGNOSIS — Z006 Encounter for examination for normal comparison and control in clinical research program: Secondary | ICD-10-CM

## 2014-11-06 NOTE — Progress Notes (Signed)
TWILIGHT Research study 4 month  Follow up telephone call completed. Patient denies any bleeding events, see epic er visit for ACE allergy. Patient compliant with study medication. Questions encouraged and answered. Next study visit late Jan research office will call to schedule.

## 2014-11-13 ENCOUNTER — Ambulatory Visit (INDEPENDENT_AMBULATORY_CARE_PROVIDER_SITE_OTHER): Payer: BLUE CROSS/BLUE SHIELD | Admitting: Pharmacist

## 2014-11-13 DIAGNOSIS — Z9861 Coronary angioplasty status: Secondary | ICD-10-CM | POA: Diagnosis not present

## 2014-11-13 DIAGNOSIS — E785 Hyperlipidemia, unspecified: Secondary | ICD-10-CM | POA: Diagnosis not present

## 2014-11-13 DIAGNOSIS — I2510 Atherosclerotic heart disease of native coronary artery without angina pectoris: Secondary | ICD-10-CM | POA: Diagnosis not present

## 2014-11-13 DIAGNOSIS — I10 Essential (primary) hypertension: Secondary | ICD-10-CM

## 2014-11-13 MED ORDER — HYDROCHLOROTHIAZIDE 25 MG PO TABS: 25.0000 mg | ORAL_TABLET | Freq: Every day | ORAL | Status: DC

## 2014-11-13 MED ORDER — LOSARTAN POTASSIUM 50 MG PO TABS: 50.0000 mg | ORAL_TABLET | Freq: Every day | ORAL | Status: DC

## 2014-11-13 NOTE — Progress Notes (Signed)
Patient ID: [name]                 DOB: 11/06/2070, 44 yo                          MRN: [id number]     HPI: Joseph Carr is a 44 y.o. male patient referred to lipid clinic by Dr. Mayford Knife. PMH is significant for CAD s/p NSTEMI in 10/2013 with DES to RI, HTN, tobacco abuse, and HLD. He is currently taking Lipitor  and Zetia  daily and tolerating them both without a problem. However, his LDL is still above goal < /dL given history of ASCVD. Patient presents today for evaluation for PCSK9i therapy.  Also of note, patient went to the ED on 9/26 for lip swelling - likely induced by his lisinopril. He reports that he did not have trouble breathing at all. He was discharged with a short prednisone taper. Patient reports that he restarted his lisinopril upon discharge and has not had any further swelling.   Current Medications: Lipitor , Zetia  Intolerances: none Risk Factors: CAD s/p NSTEMI with DES in 2015 LDL goal: 70mg /dL for secondary prevention  Diet: Eats a lot of fruits and vegetables. Had baked chicken and spaghetti yesterday. Likes to snack on low sodium chips and ice cream occasionally. Drinks a lot of water throughout the day with an occasional gatorade.  Exercise: Walks ~30 minutes daily around his neighborhood.  Family History: Heart attack, stroke, and hypertension in his father.  Social History: Quit smoking about 4 months ago. He has a 22.5 pack-year smoking history. He reports that he drinks alcohol but does not use illicit drugs.  Labs: 10/2014: TC 154, TG 69, HDL 53.7, LDL 86, LFTs wnl (on Lipitor  daily and Zetia  daily)  Past Medical History  Diagnosis Date  . Hypertension   . Hyperlipidemia LDL goal <70   . Low TSH level   . Tobacco abuse   . Hx of cardiovascular stress test     ETT/Lexiscan Myoview (11/15):  Inferior/inferolateral fixed defect c/w soft tissue attenuation and / or subendocardial scar No significant ischemia. Overall  low to intermediate risk scan.LV Ejection Fraction: 51%.  Marland Kitchen CAD (coronary artery disease)     a. s/p STEMI 10/2013 - DES to ramus intermedius. Patent LM, LAD, RCA, EF 55%;  b. 06/2014 Cath/PCI: LM nl, LAD 15p, RI patent stent prox, 19m (2.5x12 Promus Premier DES), LCX 40p, EF 55-65%.    Current Outpatient Prescriptions on File Prior to Visit  Medication Sig Dispense Refill  . AMBULATORY NON FORMULARY MEDICATION Take 90 mg by mouth 2 (two) times daily. Medication Name:  Ticagrelor  90 mg BID provided by Research TWILIGHT Study (Patient not taking: Reported on 11/03/2014)    . AMBULATORY NON FORMULARY MEDICATION Take 81 mg by mouth daily. Medication Name: ASA 81 mg daily or [place]ACE[place] (TWILIGHT Research Study)    . atorvastatin (LIPITOR) 80 MG tablet TAKE ONE TABLET BY MOUTH ONCE DAILY AT  6PM (Patient not taking: Reported on 11/03/2014) 30 tablet 5  . ezetimibe (ZETIA) 10 MG tablet Take 1 tablet (10 mg total) by mouth daily. (Patient not taking: Reported on 11/03/2014) 90 tablet 3  . metoprolol tartrate (LOPRESSOR) 25 MG tablet TAKE ONE TABLET BY MOUTH TWICE DAILY (Patient not taking: Reported on 11/03/2014) 60 tablet 6  . nitroGLYCERIN (NITROSTAT) 0.4 MG SL tablet Place 0.4 mg under the tongue every 5 (five)  minutes as needed for chest pain.     No current facility-administered medications on file prior to visit.    Allergies  Allergen Reactions  . Ace Inhibitors Swelling    Lip swelling that resolved with benadryl, did not compromise his breathing    Assessment/Plan:  1. Hyperlipidemia - Patient is already on max dose statin and Zetia with LDL still at /dL above goal 70mg /dL. Patient needs additional LDL lowering due to history of CAD s/p STEMI and DES in 2015. Only option left to lower LDL is with PCSK9 inhibitor. Discussed administration technique, benefits and side effects of Praluent injections. Will initiate paperwork for Praluent coverage.  2. HTN/CAD - Patient had ACE  inhibitor-induced lip angioedema on 11/02/14 with lisinopril. He reports that he had localized lip swelling but never had any trouble breathing. Symptoms resolved in ED with Benadryl and prednisone. He reports that he restarted his lisinopril when he left the hospital and has not had any problems. Given that patient is young and has had recent NSTEMI, there is a definite mortality benefit with ACEi/ARB therapy. Since patient did not have extremely severe reaction, breathing was not compromised, and he restarted lisinopril with no issues since then, will trial an ARB. Discontinued lisinopril/HCTZ 20/25mg , will start HCTZ  daily and losartan  daily. Had an extensive discussion with patient to watch for any further facial swelling and to call his doctor if this occurs on losartan too. At that point, would not restart any ACEi or ARB in the future.     E. , PharmD [facility] [address] [place], [place] [address] Phone: [phone]; Fax: [phone] 11/13/2014 11:32 AM

## 2014-11-13 NOTE — Patient Instructions (Signed)
Stop taking your lisinopril/hydrochlorothiazide. Start taking hydrochlorothiazide  once daily and losartan  once daily. Monitor for any signs of lip swelling - if you notice this, call your doctor. We will begin paperwork for your Praluent cholesterol injections. Once we have approval and the Praluent is shipped to your house, cal [place] at [phone] in clinic so we can help with the first injection and set up lab work in a few months.

## 2014-11-17 ENCOUNTER — Telehealth: Payer: Self-pay | Admitting: Pharmacist

## 2014-11-17 DIAGNOSIS — E785 Hyperlipidemia, unspecified: Secondary | ICD-10-CM

## 2014-11-17 MED ORDER — ALIROCUMAB 75 MG/ML ~~LOC~~ SOPN: 1.0000 "pen " | PEN_INJECTOR | SUBCUTANEOUS | Status: DC

## 2014-11-17 NOTE — Telephone Encounter (Signed)
LMOM - Praluent approved by insurance. Will send to rx to [facility]. Advised patient to call clinic when he receives first shipment of Praluent so that he can give his first injection in clinic and we can set up f/u lipid panel.

## 2014-11-25 ENCOUNTER — Telehealth: Payer: Self-pay | Admitting: Pharmacist

## 2014-11-25 NOTE — Telephone Encounter (Signed)
New message      Calling to let the pharmacist know he has not received the cholesterol injections.

## 2014-11-30 ENCOUNTER — Encounter (HOSPITAL_COMMUNITY): Payer: Self-pay | Admitting: Emergency Medicine

## 2014-11-30 ENCOUNTER — Emergency Department (HOSPITAL_COMMUNITY)
Admission: EM | Admit: 2014-11-30 | Discharge: 2014-11-30 | Disposition: A | Discharge status: Home / Self Care | Payer: BLUE CROSS/BLUE SHIELD | Attending: Emergency Medicine | Admitting: Emergency Medicine

## 2014-11-30 DIAGNOSIS — N390 Urinary tract infection, site not specified: Secondary | ICD-10-CM | POA: Insufficient documentation

## 2014-11-30 DIAGNOSIS — Z7901 Long term (current) use of anticoagulants: Secondary | ICD-10-CM | POA: Insufficient documentation

## 2014-11-30 DIAGNOSIS — Z9889 Other specified postprocedural states: Secondary | ICD-10-CM | POA: Diagnosis not present

## 2014-11-30 DIAGNOSIS — Z87891 Personal history of nicotine dependence: Secondary | ICD-10-CM | POA: Insufficient documentation

## 2014-11-30 DIAGNOSIS — E785 Hyperlipidemia, unspecified: Secondary | ICD-10-CM | POA: Diagnosis not present

## 2014-11-30 DIAGNOSIS — Z79899 Other long term (current) drug therapy: Secondary | ICD-10-CM | POA: Insufficient documentation

## 2014-11-30 DIAGNOSIS — I252 Old myocardial infarction: Secondary | ICD-10-CM | POA: Diagnosis not present

## 2014-11-30 DIAGNOSIS — Z9861 Coronary angioplasty status: Secondary | ICD-10-CM | POA: Diagnosis not present

## 2014-11-30 DIAGNOSIS — R319 Hematuria, unspecified: Secondary | ICD-10-CM | POA: Diagnosis present

## 2014-11-30 DIAGNOSIS — I10 Essential (primary) hypertension: Secondary | ICD-10-CM | POA: Insufficient documentation

## 2014-11-30 DIAGNOSIS — I2510 Atherosclerotic heart disease of native coronary artery without angina pectoris: Secondary | ICD-10-CM | POA: Insufficient documentation

## 2014-11-30 LAB — I-STAT CHEM 8, ED
BUN: 10 mg/dL (ref 6–20)
CHLORIDE: 104 mmol/L (ref 101–111)
Calcium, Ion: 1.19 mmol/L (ref 1.12–1.23)
Creatinine, Ser: 1.2 mg/dL (ref 0.61–1.24)
GLUCOSE: 103 mg/dL — AB (ref 65–99)
HCT: 48 % (ref 39.0–52.0)
Hemoglobin: 16.3 g/dL (ref 13.0–17.0)
Potassium: 4.1 mmol/L (ref 3.5–5.1)
SODIUM: 141 mmol/L (ref 135–145)
TCO2: 24 mmol/L (ref 0–100)

## 2014-11-30 LAB — URINALYSIS, ROUTINE W REFLEX MICROSCOPIC
Glucose, UA: NEGATIVE mg/dL
Ketones, ur: 15 mg/dL — AB
Nitrite: POSITIVE — AB
PROTEIN: 100 mg/dL — AB
Specific Gravity, Urine: 1.02 (ref 1.005–1.030)
UROBILINOGEN UA: 0.2 mg/dL (ref 0.0–1.0)
pH: 5 (ref 5.0–8.0)

## 2014-11-30 LAB — URINE MICROSCOPIC-ADD ON

## 2014-11-30 MED ORDER — CEPHALEXIN 500 MG PO CAPS: 500.0000 mg | ORAL_CAPSULE | Freq: Two times a day (BID) | ORAL | Status: DC

## 2014-11-30 NOTE — Discharge Instructions (Signed)
1. Medications: keflex, usual home medications 2. Treatment: rest, drink plenty of fluids  3. Follow Up: please followup with urology this week for discussion of your diagnoses and further evaluation after today's visit; please return to the ER for dizziness, lightheadedness, loss of consciousness, severe abdominal pain, persistent vomiting, new or worsening symptoms  Urinary Tract Infection Urinary tract infections (UTIs) can develop anywhere along your urinary tract. Your urinary tract is your body's drainage system for removing wastes and extra water. Your urinary tract includes two kidneys, two ureters, a bladder, and a urethra. Your kidneys are a pair of bean-shaped organs. Each kidney is about the size of your fist. They are located below your ribs, one on each side of your spine. CAUSES Infections are caused by microbes, which are microscopic organisms, including fungi, viruses, and bacteria. These organisms are so small that they can only be seen through a microscope. Bacteria are the microbes that most commonly cause UTIs. SYMPTOMS  Symptoms of UTIs may vary by age and gender of the patient and by the location of the infection. Symptoms in young women typically include a frequent and intense urge to urinate and a painful, burning feeling in the bladder or urethra during urination. Older women and men are more likely to be tired, shaky, and weak and have muscle aches and abdominal pain. A fever may mean the infection is in your kidneys. Other symptoms of a kidney infection include pain in your back or sides below the ribs, nausea, and vomiting. DIAGNOSIS To diagnose a UTI, your caregiver will ask you about your symptoms. Your caregiver will also ask you to provide a urine sample. The urine sample will be tested for bacteria and white blood cells. White blood cells are made by your body to help fight infection. TREATMENT  Typically, UTIs can be treated with medication. Because most UTIs are  caused by a bacterial infection, they usually can be treated with the use of antibiotics. The choice of antibiotic and length of treatment depend on your symptoms and the type of bacteria causing your infection. HOME CARE INSTRUCTIONS  If you were prescribed antibiotics, take them exactly as your caregiver instructs you. Finish the medication even if you feel better after you have only taken some of the medication.  Drink enough water and fluids to keep your urine clear or pale yellow.  Avoid caffeine, tea, and carbonated beverages. They tend to irritate your bladder.  Empty your bladder often. Avoid holding urine for long periods of time.  Empty your bladder before and after sexual intercourse.  After a bowel movement, women should cleanse from front to back. Use each tissue only once. SEEK MEDICAL CARE IF:   You have back pain.  You develop a fever.  Your symptoms do not begin to resolve within 3 days. SEEK IMMEDIATE MEDICAL CARE IF:   You have severe back pain or lower abdominal pain.  You develop chills.  You have nausea or vomiting.  You have continued burning or discomfort with urination. MAKE SURE YOU:   Understand these instructions.  Will watch your condition.  Will get help right away if you are not doing well or get worse.   This information is not intended to replace advice given to you by your health care provider. Make sure you discuss any questions you have with your health care provider.   Document Released: 11/02/2004 Document Revised: 10/14/2014 Document Reviewed: 03/03/2011 Elsevier Interactive Patient Education 2016 Elsevier Inc.   Hematuria, Adult Hematuria is  blood in your urine. It can be caused by a bladder infection, kidney infection, prostate infection, kidney stone, or cancer of your urinary tract. Infections can usually be treated with medicine, and a kidney stone usually will pass through your urine. If neither of these is the cause of your  hematuria, further workup to find out the reason may be needed. It is very important that you tell your health care provider about any blood you see in your urine, even if the blood stops without treatment or happens without causing pain. Blood in your urine that happens and then stops and then happens again can be a symptom of a very serious condition. Also, pain is not a symptom in the initial stages of many urinary cancers. HOME CARE INSTRUCTIONS   Drink lots of fluid, 3-4 quarts a day. If you have been diagnosed with an infection, cranberry juice is especially recommended, in addition to large amounts of water.  Avoid caffeine, tea, and carbonated beverages because they tend to irritate the bladder.  Avoid alcohol because it may irritate the prostate.  Take all medicines as directed by your health care provider.  If you were prescribed an antibiotic medicine, finish it all even if you start to feel better.  If you have been diagnosed with a kidney stone, follow your health care provider's instructions regarding straining your urine to catch the stone.  Empty your bladder often. Avoid holding urine for long periods of time.  After a bowel movement, women should cleanse front to back. Use each tissue only once.  Empty your bladder before and after sexual intercourse if you are a male. SEEK MEDICAL CARE IF:  You develop back pain.  You have a fever.  You have a feeling of sickness in your stomach (nausea) or vomiting.  Your symptoms are not better in 3 days. Return sooner if you are getting worse. SEEK IMMEDIATE MEDICAL CARE IF:   You develop severe vomiting and are unable to keep the medicine down.  You develop severe back or abdominal pain despite taking your medicines.  You begin passing a large amount of blood or clots in your urine.  You feel extremely weak or faint, or you pass out. MAKE SURE YOU:   Understand these instructions.  Will watch your condition.  Will  get help right away if you are not doing well or get worse.   This information is not intended to replace advice given to you by your health care provider. Make sure you discuss any questions you have with your health care provider.   Document Released: 01/23/2005 Document Revised: 02/13/2014 Document Reviewed: 09/23/2012 Elsevier Interactive Patient Education 2016 [employer].    Emergency Department Resource Guide 1) Find a Doctor and Pay Out of Pocket Although you won't have to find out who is covered by your insurance plan, it is a good idea to ask around and get recommendations. You will then need to call the office and see if the doctor you have chosen will accept you as a new patient and what types of options they offer for patients who are self-pay. Some doctors offer discounts or will set up payment plans for their patients who do not have insurance, but you will need to ask so you aren't surprised when you get to your appointment.  2) Contact Your Local Health Department Not all health departments have doctors that can see patients for sick visits, but many do, so it is worth a call to see if  yours does. If you don't know where your local health department is, you can check in your phone book. The CDC also has a tool to help you locate your state's health department, and many state websites also have listings of all of their local health departments.  3) Find a Walk-in Clinic If your illness is not likely to be very severe or complicated, you may want to try a walk in clinic. These are popping up all over the country in pharmacies, drugstores, and shopping centers. They're usually staffed by nurse practitioners or physician assistants that have been trained to treat common illnesses and complaints. They're usually fairly quick and inexpensive. However, if you have serious medical issues or chronic medical problems, these are probably not your best option.  No Primary Care  Doctor: - Call Health Connect at  [phone] - they can help you locate a primary care doctor that  accepts your insurance, provides certain services, etc. - Physician Referral Service- [phone]  Chronic Pain Problems: Organization         Address  Phone   Notes  [facility]  [phone] Patients need to be referred by their primary care doctor.   Medication Assistance: Organization         Address  Phone   Notes  [facility] Medication [facility] [address] [place], [place] [address] [phone] --Must be a resident of [facility] [place] -- Must have NO insurance coverage whatsoever (no Medicaid/ Medicare, etc.) -- The pt. MUST have a primary care doctor that directs their care regularly and follows them in the community   MedAssist  [phone]   [employer]  [phone]    Agencies that provide inexpensive medical care: Organization         Address  Phone   Notes  [facility]  [phone]   [facility]    [phone]) [phone]   [facility] [address] [place], [place] [address] [phone]   [facility] [address], [place] [phone]   Planned Parenthood    [phone]   [facility]    [phone]) [phone]   [facility] and [facility]  [address], [place] Phone:  [phone], Fax:  [phone] Hours of Operation:  9 am - 6 pm, M-F.  Also accepts Medicaid/Medicare and self-pay.  [facility]  [address], [place] Phone: [phone]) [phone], Fax: [phone]. Hours of Operation:  8:30 am - 5:30 pm, M-F.  Also accepts Medicaid and self-pay.  [facility] [place] [address], [place] Phone: [phone]) [phone]   [facility] [address], [place], [place] [phone], Ext. [phone] Mondays & Thursdays: 7-9 AM.  First 15 patients are seen on a first  come, first serve basis.    Medicaid-accepting [facility] Providers:  Organization         Address  Phone   Notes  [facility] [address], [place] [phone] Also accepts self-pay patients.  [facility] [address], [place]  [phone]   [facility] [address], [place] [phone]   [facility] [address] [place] [address], [place] [phone]) [phone]   Renaye RakersVeita Bland [address], [place]   [phone] Only accepts [place] Access Medicaid patients after they have their name applied to their card.   Self-Pay (no insurance) in [facility]:  Organization         Address  Phone   Notes  Sickle Cell Patients, [facility] [place] [phone]   [facility] [place] [phone]   [facility] [place]  [place], [place], [place] [phone]   [facility]/Dr. Osei-Bonsu  [address] [place] or [address], [place] [phone] Phone number for both [place] and [place] locations is the same.  [facility] [address] [place] [phone]   [facility] [address], [place] or [address] [phone]   [facility] [address] [place] [phone], phone; [phone], fax Sees patients 1st and 3rd Saturday of every month.  Must not qualify for public or private insurance (i.e. Medicaid, Medicare, [place] Health Choice, Veterans' Benefits)  Household income should be no more than 200% of the poverty level The clinic cannot treat you if you are pregnant or think you are pregnant  Sexually transmitted diseases are not treated at the clinic.    Dental Care: Organization          Address  Phone  Notes  [facility] Department of [facility] [place] [phone] Accepts children up to age 71 who are enrolled in [place] or [place]; pregnant women with a Medicaid card; and children who have applied for Medicaid or [place] Health Choice, but were declined, whose parents can pay a reduced fee at time of service.  [facility] of [facility]  [address], [place] [phone] Accepts children up to age 40 who are enrolled in [place] or [place]; pregnant women with a Medicaid card; and children who have applied for Medicaid or [place] Health Choice, but were declined, whose parents can pay a reduced fee at time of service.  [facility] Adult Dental Access PROGRAM  [place] [phone] Patients are seen by appointment only. Walk-ins are not accepted. [place] will see patients 30 years of age and older. Monday - Tuesday (8am-5pm) Most Wednesdays (8:30-5pm) $30 per visit, cash only  [facility] Adult Dental Access PROGRAM  [address], [facility] [phone] Patients are seen by appointment only. Walk-ins are not accepted. [place] will see patients 18 years of age and older. One Wednesday Evening (Monthly: Volunteer Based).  $30 per visit, cash only  [place]  [phone] for adults; Children under age 23, call Graduate Pediatric Dentistry at [phone]. Children aged 42-14, please call [phone] to request a pediatric application.  Dental services are provided in all areas of dental care including fillings, crowns and bridges, complete and partial dentures, implants, gum treatment, root canals, and extractions. Preventive care is also provided. Treatment is provided to both adults and children. Patients are selected via a lottery and there is often a waiting list.   [facility] [address] [place]  [phone] [email or web address], [place], [place] [phone], Ext. [phone] Second and Fourth Thursday of each month, opens at 6:30 AM;  Clinic ends at 9 AM.  Patients are seen on a first-come first-served basis, and a limited number are seen during each clinic.   [facility]  [address] Hillard Danker [place], [place] [phone]   Eligibility Requirements You must have lived in [place], [place], or [place] counties for at least the last three months.   You cannot be eligible for state or federal sponsored [employer], including [employer], [place], or [employer].   You generally cannot be eligible for healthcare insurance through your employer.    How to apply: Eligibility screenings are held every Tuesday and Wednesday afternoon from 1:00 pm until 4:00 pm. You do not need an appointment for the interview!  [facility] [address], [place], [place]   [place]  [place] Department  [place]  [phone]    Behavioral Health Resources in the Community: Intensive Outpatient Programs Organization         Address  Phone  Notes  [place]. [address] [place], [place] [phone]   [facility] Outpatient [address] [place], [place]   ADS: Alcohol & Drug Svcs [address], [place], [place]   [place] [address],  [place], [place] or [phone]   Substance Abuse Resources Organization         Address  Phone  Notes  Alcohol and Drug Services  2060444119   [place]  [phone]   The [place]   Chinita Pester  [phone]   Residential & Outpatient Substance Abuse Program  [phone]   Psychological  Services Organization         Address  Phone  Notes  [facility] [place]  [place]  [phone]   [place] [address], [place] or [phone]    Mobile Crisis Teams Organization         Address  Phone  Notes  [facility], Mobile Crisis Care Unit  [phone]   [facility]  [address] [place], [place]   [place] Levels [address] [place] [phone]    Self-Help/Support Groups Organization         Address  Phone             Notes  [place]. of [place] - variety of support groups  [place] Call for more information  Narcotics Anonymous (NA), [facility] [address], [employer] [place]  2 meetings at this location   Special educational needs teacher         Address  Phone  Notes  [facility] Residential Treatment [place],    [place]  [phone]   [facility]  [address], [place] [address]6415, [place], [place]   [place], [place] [phone] Admissions: 8am-3pm M-F  Incentives Substance [place] [address]    [place], [place] [phone]   [facility] [address] Jadene Pierini [place], [place]   [facility] [address]  [place], [place]   [facility] - Intensive Outpatient [address], [place], [place]   [facility] ([place].) [address] [place], [place] or [phone]  [facility] (RTS) [address], [place], [place] [phone] Accepts Medicaid  [facility] [address],  [place] [phone] Substance Abuse/Addiction Treatment   [facility] Organization         Address  Phone  Notes  [facility]  [phone]   Angie Fava, PhD [address] [place]vin [place], [place]   [phone] or [phone]   [facility]al   [address] [place], [place] [phone]   [facility] [address] [place], [place] [phone] Insurance/Medicaid/sponsorship through [facility] and Families [address]                                    [place], [place] [phone] Therapy/tele-psych/case  [facility] [address], [place] [phone]    Dr. Lolly Mustache  [phone]   [facility] [place]  [employer] [facility] Dept. 1) [address] [place] 2) [address] [place] 3)  [address], [place] [phone]  [phone]   [facility] Child Abuse Hotline 805-343-1831 or [phone] (After Hours)

## 2014-11-30 NOTE — ED Provider Notes (Signed)
CSN: [id number]     Arrival date & time 11/30/14  0730 History   First MD Initiated Contact with Patient 11/30/14 [id number]     No chief complaint on file.   HPI   Johna RolesJames M Shough is a 44 y.o. male with a PMH of HTN, HLD, CAD s/p STEMI 10/2013, tobacco abuse who presents to the ED with hematuria. He reports this started last night, and occurs each time he urinates. He states initially, the blood is bright red, but turns dark toward the end of his stream. He reports he has passed several clots. He denies recent injury, fever, chills, dizziness, lightheadedness chest pain, shortness of breath, abdominal pain, N/V/D/C, hematochezia, melena. He reports dysuria, and states he urinates "longer than normal." He states he takes brilinta for anticoagulation.    Past Medical History  Diagnosis Date  . Hypertension   . Hyperlipidemia LDL goal <70   . Low TSH level   . Tobacco abuse   . Hx of cardiovascular stress test     ETT/Lexiscan Myoview (11/15):  Inferior/inferolateral fixed defect c/w soft tissue attenuation and / or subendocardial scar No significant ischemia. Overall low to intermediate risk scan.LV Ejection Fraction: 51%.  Marland Kitchen. CAD (coronary artery disease)     a. s/p STEMI 10/2013 - DES to ramus intermedius. Patent LM, LAD, RCA, EF 55%;  b. 06/2014 Cath/PCI: LM nl, LAD 15p, RI patent stent prox, 3433m (2.5x12 Promus Premier DES), LCX 40p, EF 55-65%.   Past Surgical History  Procedure Laterality Date  . Finger fracture surgery Left 2014    4th finger  . Left heart catheterization with coronary angiogram N/A 10/20/2013    Procedure: LEFT HEART CATHETERIZATION WITH CORONARY ANGIOGRAM;  Surgeon: Corky CraftsJayadeep S Varanasi, MD;  [facility];  Service: Cardiovascular;  Laterality: N/A;  . Cardiac catheterization N/A 07/03/2014    Procedure: Left Heart Cath and Coronary Angiography;  Surgeon: Peter M SwazilandJordan, MD;  [facility];  Service: Cardiovascular;  Laterality: N/A;  . Cardiac  catheterization N/A 07/03/2014    Procedure: Coronary Stent Intervention;  Surgeon: Peter M SwazilandJordan, MD;  [facility];  Service: Cardiovascular;  Laterality: N/A;  . Percutaneous coronary stent intervention (pci-s)  07/03/2014    RAMUS    . Coronary angioplasty     Family History  Problem Relation Age of Onset  . Heart attack Father   . Hypertension Father   . Stroke Father    Social History  Substance Use Topics  . Smoking status: Former Smoker -- 0.75 packs/day for 30 years    Types: Cigarettes    Quit date: 07/03/2014  . Smokeless tobacco: Never Used  . Alcohol Use: Yes     Review of Systems  Constitutional: Negative for fever and chills.  Respiratory: Negative for shortness of breath.   Cardiovascular: Negative for chest pain.  Gastrointestinal: Negative for nausea, vomiting, abdominal pain, diarrhea, constipation and blood in stool.  Genitourinary: Positive for dysuria and hematuria. Negative for urgency and frequency.  Neurological: Negative for dizziness, light-headedness and headaches.  All other systems reviewed and are negative.     Allergies  Ace inhibitors  Home Medications   Prior to Admission medications   Medication Sig Start Date End Date Taking? Authorizing Provider  Alirocumab (PRALUENT) 75 MG/ML SOPN Inject 1 pen into the skin every 14 (fourteen) days. 11/17/14   Quintella Reichertraci R Turner, MD  AMBULATORY NON FORMULARY MEDICATION Take 90 mg by mouth 2 (two) times daily.  Medication Name:  Ticagrelor  90 mg BID provided by Research TWILIGHT Study Patient not taking: Reported on 11/03/2014 07/07/14   Corky Crafts, MD  AMBULATORY NON FORMULARY MEDICATION Take 81 mg by mouth daily. Medication Name: ASA 81 mg daily or PLACEBO (TWILIGHT Research Study) 10/23/14   Kathleene Hazel, MD  atorvastatin (LIPITOR) 80 MG tablet TAKE ONE TABLET BY MOUTH ONCE DAILY AT  6PM Patient not taking: Reported on 11/03/2014 07/07/14   Quintella Reichert, MD  ezetimibe  (ZETIA) 10 MG tablet Take 1 tablet (10 mg total) by mouth daily. Patient not taking: Reported on 11/03/2014 01/08/14   Quintella Reichert, MD  hydrochlorothiazide (HYDRODIURIL) 25 MG tablet Take 1 tablet (25 mg total) by mouth daily. 11/13/14   Quintella Reichert, MD  losartan (COZAAR) 50 MG tablet Take 1 tablet (50 mg total) by mouth daily. 11/13/14   Quintella Reichert, MD  metoprolol tartrate (LOPRESSOR) 25 MG tablet TAKE ONE TABLET BY MOUTH TWICE DAILY Patient not taking: Reported on 11/03/2014 05/21/14   Quintella Reichert, MD  nitroGLYCERIN (NITROSTAT) 0.4 MG SL tablet Place 0.4 mg under the tongue every 5 (five) minutes as needed for chest pain.    Historical Provider, MD    BP 154/98 mmHg  Pulse 113  Temp(Src) 98.9 F (37.2 C) (Oral)  Resp 17  Ht  (1.753 m)  Wt 167 lb (75.751 kg)  BMI 24.65 kg/m2  SpO2 100% Physical Exam  Constitutional: He is oriented to person, place, and time. He appears well-developed and well-nourished. No distress.  HENT:  Head: Normocephalic and atraumatic.  Right Ear: External ear normal.  Left Ear: External ear normal.  Nose: Nose normal.  Mouth/Throat: Uvula is midline, oropharynx is clear and moist and mucous membranes are normal.  Eyes: Conjunctivae, EOM and lids are normal. Pupils are equal, round, and reactive to light. Right eye exhibits no discharge. Left eye exhibits no discharge. No scleral icterus.  Neck: Normal range of motion. Neck supple.  Cardiovascular: Normal rate, regular rhythm, normal heart sounds, intact distal pulses and normal pulses.   Pulmonary/Chest: Effort normal and breath sounds normal. No respiratory distress. He has no wheezes. He has no rales.  Abdominal: Soft. Normal appearance and bowel sounds are normal. He exhibits no distension and no mass. There is no tenderness. There is no rigidity, no rebound and no guarding.  Genitourinary: Testes normal and penis normal. Right testis shows no mass, no swelling and no tenderness. Left testis  shows no mass, no swelling and no tenderness. No penile erythema or penile tenderness. No discharge found.  Musculoskeletal: Normal range of motion. He exhibits no edema or tenderness.  Lymphadenopathy:       Right: No inguinal adenopathy present.       Left: No inguinal adenopathy present.  Neurological: He is alert and oriented to person, place, and time.  Skin: Skin is warm, dry and intact. No rash noted. He is not diaphoretic. No erythema. No pallor.  Psychiatric: He has a normal mood and affect. His speech is normal and behavior is normal.  Nursing note and vitals reviewed.   ED Course  Procedures (including critical care time)  Labs Review Labs Reviewed  URINALYSIS, ROUTINE W REFLEX MICROSCOPIC (NOT AT [facility]) - Abnormal; Notable for the following:    Color, Urine RED (*)    APPearance TURBID (*)    Hgb urine dipstick LARGE (*)    Bilirubin Urine LARGE (*)    Ketones, ur 15 (*)  Protein, ur 100 (*)    Nitrite POSITIVE (*)    Leukocytes, UA MODERATE (*)    All other components within normal limits  URINE MICROSCOPIC-ADD ON  I-STAT CHEM 8, ED   Imaging Review No results found.   I have personally reviewed and evaluated these lab results as part of my medical decision-making.   EKG Interpretation None      MDM   Final diagnoses:  Hematuria  Acute UTI    44 year old male presents with hematuria, which started last night and has occurred with urination since that time. Denies recent injury, fever, chills, chest pain, shortness of breath, abdominal pain, N/V/D/C, hematochezia, melena. Reports dysuria, and states he urinates "longer than normal." Currently takes brilinta for anticoagulation. Patient is afebrile. Mildly tachycardic. Heart regular rhythm. Abdomen soft, non-tender, non-distended. No rebound, guarding, or masses. Normal male GU exam, no penile bleeding.  Chem 8 remarkable for creatinine 1.2, hemoglobin 16. UA with large hemoglobin, positive nitrates,  moderate leukocytes.  Will treat UTI with keflex. Patient to follow up with urology for further evaluation and management of hematuria. Patient is well-appearing, heart rate improved to 90s. Feel he is stable for discharge at this time. Patient verbalizes his understanding and is in agreement with plan.  BP 142/86 mmHg  Pulse 90  Temp(Src) 98.9 F (37.2 C) (Oral)  Resp 17  Ht  (1.753 m)  Wt 167 lb (75.751 kg)  BMI 24.65 kg/m2  SpO2 100%         Mady Gemma, PA-C 11/30/14 1122  Gerhard Munch, MD 12/01/14 2134

## 2014-11-30 NOTE — ED Notes (Signed)
Reports hematuria onset last night, denies GIB symptoms, no CP or SOB, A/O X4, ambulatory and in NAD

## 2014-11-30 NOTE — Telephone Encounter (Signed)
Spoke with pt and [facility] [employer] - pt had not heard from pharmacy about Praluent delivery yet. Rx was sent 2 weeks ago but [facility] did not have Furn[employer] conservator/restorerinsurance info on file. Provided them with info and pt should receive a call today to set up delivery of Praluent injections. Pt is aware and will call when he receives his first shipment.

## 2014-11-30 NOTE — ED Notes (Addendum)
Per Malachi BondsIda, NT patient is getting undressed and into a gown

## 2014-12-10 ENCOUNTER — Other Ambulatory Visit: Payer: Self-pay | Admitting: Cardiology

## 2014-12-18 ENCOUNTER — Telehealth: Payer: Self-pay | Admitting: Pharmacist

## 2014-12-18 DIAGNOSIS — E785 Hyperlipidemia, unspecified: Secondary | ICD-10-CM

## 2014-12-18 NOTE — Telephone Encounter (Signed)
New Message    [facility], stating that he was told that he could come back in the office for her to show him how to do injections. Please call back and advise.

## 2014-12-18 NOTE — Telephone Encounter (Signed)
Returned patient's call. Gave him verbal instructions over the telephone for Praluent injections. He will give his first shot in his abdomen today. Scheduled f/u lipid panel in 2 months. Pt aware to call if he has any other questions concerning Praluent injections.

## 2015-01-07 ENCOUNTER — Other Ambulatory Visit: Payer: Self-pay

## 2015-01-07 ENCOUNTER — Other Ambulatory Visit: Payer: Self-pay | Admitting: Cardiology

## 2015-01-12 ENCOUNTER — Other Ambulatory Visit: Payer: Self-pay | Admitting: Physician Assistant

## 2015-01-12 ENCOUNTER — Telehealth: Payer: Self-pay | Admitting: Physician Assistant

## 2015-01-12 MED ORDER — METOPROLOL TARTRATE 25 MG PO TABS: 25.0000 mg | ORAL_TABLET | Freq: Two times a day (BID) | ORAL | Status: DC

## 2015-01-12 NOTE — Telephone Encounter (Signed)
Patient with history CAD, HTN, HLD and tobacco abuse contacted after hour cardiology service for refill on his metoprolol. His record was reviewed, he was last seen by Dr. Mayford Knifeurner on 10/16/2014 who commented continue ASA, brilinta, statin and beta blocker. There was no medication change.  However under medication list, it looks like patient reported near end of September that his metoprolol has been discontinued by provider. Patient was not aware of this and has been taking his metoprolol at home. I have reviewed all of the note in EPIC from Sept until now. I can NOT find any evidence that his BB was discontinued by a provider. Given the patient has not had any symptom taking metoprolol, I have given him 6 month refill.  Note he did present to ED on 11/03/2014 with facial swelling, his lisinopril/HCTZ has been changed to separate losartan and HCTZ to potentially avoid any side effect/angioedema.   Ramond DialSigned,   PA Pager: [phone]

## 2015-02-12 ENCOUNTER — Other Ambulatory Visit (INDEPENDENT_AMBULATORY_CARE_PROVIDER_SITE_OTHER): Payer: No Typology Code available for payment source | Admitting: *Deleted

## 2015-02-12 DIAGNOSIS — E785 Hyperlipidemia, unspecified: Secondary | ICD-10-CM | POA: Diagnosis not present

## 2015-02-12 LAB — LIPID PANEL
Cholesterol: 168 mg/dL (ref 125–200)
HDL: 42 mg/dL (ref >=40)
LDL CALC: 108 mg/dL (ref <130)
Total CHOL/HDL Ratio: 4 Ratio (ref <=5.0)
Triglycerides: 91 mg/dL (ref <150)
VLDL: 18 mg/dL (ref <30)

## 2015-02-12 LAB — HEPATIC FUNCTION PANEL
ALT: 24 U/L (ref 9–46)
AST: 35 U/L (ref 10–40)
Albumin: 4.4 g/dL (ref 3.6–5.1)
Alkaline Phosphatase: 41 U/L (ref 40–115)
BILIRUBIN DIRECT: 0.1 mg/dL (ref <=0.2)
BILIRUBIN INDIRECT: 0 mg/dL — AB (ref 0.2–1.2)
BILIRUBIN TOTAL: 0.1 mg/dL — AB (ref 0.2–1.2)
Total Protein: 7.6 g/dL (ref 6.1–8.1)

## 2015-02-12 NOTE — Addendum Note (Signed)
Addended by: Tonita PhoenixBOWDEN,  K on: 02/12/2015 07:53 AM   Modules accepted: Orders

## 2015-02-22 ENCOUNTER — Telehealth: Payer: Self-pay | Admitting: Pharmacist

## 2015-02-22 DIAGNOSIS — E785 Hyperlipidemia, unspecified: Secondary | ICD-10-CM

## 2015-02-22 MED ORDER — ALIROCUMAB 75 MG/ML ~~LOC~~ SOPN: 1.0000 "pen " | PEN_INJECTOR | SUBCUTANEOUS | Status: DC

## 2015-02-22 NOTE — Telephone Encounter (Signed)
Called to let pt know that Praluent was approved by his insurance company. Rx sent to [facility]. Pt will call when he receives his first shipment and does his first injection so that we can schedule follow up lab work.

## 2015-03-02 ENCOUNTER — Telehealth: Payer: Self-pay | Admitting: Pharmacist

## 2015-03-02 NOTE — Telephone Encounter (Signed)
Pt returned call stating [facility] has not called him to set-up shipment yet and he has not received injections.   Spoke to [place] at [employer]. She states she is awaiting a phone call from the patient to set-up shipment. Will call patient back and have him call to schedule shipment.   Spoke to Mr. Golightly and instructed him to call [facility] to set-up shipment. He stated he understood and would call this afternoon. He will call us when he has received his injections.

## 2015-03-02 NOTE — Telephone Encounter (Signed)
LM for patient to call back regarding shipment of praluent and to schedule follow-up labs.

## 2015-03-09 ENCOUNTER — Other Ambulatory Visit: Payer: Self-pay | Admitting: Pharmacist

## 2015-03-09 DIAGNOSIS — E785 Hyperlipidemia, unspecified: Secondary | ICD-10-CM

## 2015-04-15 ENCOUNTER — Other Ambulatory Visit: Payer: Self-pay | Admitting: Internal Medicine

## 2015-04-15 DIAGNOSIS — R590 Localized enlarged lymph nodes: Secondary | ICD-10-CM

## 2015-04-16 ENCOUNTER — Other Ambulatory Visit: Payer: Self-pay | Admitting: Internal Medicine

## 2015-04-16 DIAGNOSIS — R590 Localized enlarged lymph nodes: Secondary | ICD-10-CM

## 2015-04-19 ENCOUNTER — Other Ambulatory Visit: Payer: Self-pay | Admitting: Internal Medicine

## 2015-04-19 DIAGNOSIS — R590 Localized enlarged lymph nodes: Secondary | ICD-10-CM

## 2015-04-20 ENCOUNTER — Other Ambulatory Visit: Payer: Self-pay | Admitting: Internal Medicine

## 2015-04-21 ENCOUNTER — Encounter: Payer: Self-pay | Admitting: *Deleted

## 2015-04-21 ENCOUNTER — Other Ambulatory Visit: Payer: No Typology Code available for payment source

## 2015-04-21 ENCOUNTER — Encounter: Payer: Self-pay | Admitting: Cardiology

## 2015-04-21 ENCOUNTER — Ambulatory Visit (INDEPENDENT_AMBULATORY_CARE_PROVIDER_SITE_OTHER): Payer: No Typology Code available for payment source | Admitting: Cardiology

## 2015-04-21 VITALS — BP 140/96 | HR 93 | Ht 69.0 in | Wt 177.8 lb

## 2015-04-21 DIAGNOSIS — E785 Hyperlipidemia, unspecified: Secondary | ICD-10-CM

## 2015-04-21 DIAGNOSIS — I2510 Atherosclerotic heart disease of native coronary artery without angina pectoris: Secondary | ICD-10-CM | POA: Diagnosis not present

## 2015-04-21 DIAGNOSIS — Z9861 Coronary angioplasty status: Secondary | ICD-10-CM

## 2015-04-21 DIAGNOSIS — R011 Cardiac murmur, unspecified: Secondary | ICD-10-CM

## 2015-04-21 DIAGNOSIS — I10 Essential (primary) hypertension: Secondary | ICD-10-CM | POA: Diagnosis not present

## 2015-04-21 DIAGNOSIS — Z006 Encounter for examination for normal comparison and control in clinical research program: Secondary | ICD-10-CM

## 2015-04-21 NOTE — Progress Notes (Signed)
Cardiology Office Note   Date:  04/21/2015   ID:  Joseph Carr, DOB 07/18/1970, MRN [id number]  PCP:  Tommy Rainwater, MD    Chief Complaint  Patient presents with  . Coronary Artery Disease  . Hypertension  . Hyperlipidemia      History of Present Illness: Joseph Carr is a 45 y.o. male with a history of CAD s/p NSTEMI 10/2013 s/p PCI with DES to RI, HTN, tobacco abuse, HL.Repeat cath for CP showed ramus-2 lesion 90% stenosed just distal to prior stent and underwent drug-eluting stent. Since his PCI he has not had any further anginal CP and denies any SOB.He did have one episode of pain that did not feel like his angina and only lasted a second and has not had any further CP.   He denies significant dyspnea, orthopnea, PND, edema. He denies syncope but occasionally has some positional dizziness if moving too fast.    Past Medical History  Diagnosis Date  . Hypertension   . Hyperlipidemia LDL goal <70   . Low TSH level   . Tobacco abuse   . Hx of cardiovascular stress test     ETT/Lexiscan Myoview (11/15):  Inferior/inferolateral fixed defect c/w soft tissue attenuation and / or subendocardial scar No significant ischemia. Overall low to intermediate risk scan.LV Ejection Fraction: 51%.  Marland Kitchen CAD (coronary artery disease)     a. s/p STEMI 10/2013 - DES to ramus intermedius. Patent LM, LAD, RCA, EF 55%;  b. 06/2014 Cath/PCI: LM nl, LAD 15p, RI patent stent prox, 80m (2.5x12 Promus Premier DES), LCX 40p, EF 55-65%.    Past Surgical History  Procedure Laterality Date  . Finger fracture surgery Left 2014    4th finger  . Left heart catheterization with coronary angiogram N/A 10/20/2013    Procedure: LEFT HEART CATHETERIZATION WITH CORONARY ANGIOGRAM;  Surgeon: Corky Crafts, MD;  Location: [facility] CATH LAB;  Service: Cardiovascular;  Laterality: N/A;  . Cardiac catheterization N/A 07/03/2014    Procedure: Left Heart Cath and Coronary  Angiography;  Surgeon: Peter M Swaziland, MD;  Location: [facility] INVASIVE CV LAB;  Service: Cardiovascular;  Laterality: N/A;  . Cardiac catheterization N/A 07/03/2014    Procedure: Coronary Stent Intervention;  Surgeon: Peter M Swaziland, MD;  Location: [facility] INVASIVE CV LAB;  Service: Cardiovascular;  Laterality: N/A;  . Percutaneous coronary stent intervention (pci-s)  07/03/2014    RAMUS    . Coronary angioplasty       Current Outpatient Prescriptions  Medication Sig Dispense Refill  . Alirocumab (PRALUENT) 75 MG/ML SOPN Inject 1 pen into the skin every 14 (fourteen) days. 2 pen 11  . AMBULATORY NON FORMULARY MEDICATION Take 90 mg by mouth 2 (two) times daily. Medication Name:  Ticagrelor  90 mg BID provided by Research TWILIGHT Study    . AMBULATORY NON FORMULARY MEDICATION Take 81 mg by mouth daily. Medication Name: ASA 81 mg daily or PLACEBO (TWILIGHT Research Study)    . atorvastatin (LIPITOR) 80 MG tablet TAKE ONE TABLET BY MOUTH ONCE DAILY AT  6PM 30 tablet 5  . ezetimibe (ZETIA) 10 MG tablet Take 1 tablet (10 mg total) by mouth daily. 90 tablet 3  . hydrochlorothiazide (HYDRODIURIL) 25 MG tablet Take 1 tablet (25 mg total) by mouth daily. 90 tablet 3  . losartan (COZAAR) 50 MG tablet Take 1 tablet (50 mg total) by mouth daily. 90  tablet 3  . NITROSTAT 0.4 MG SL tablet DISSOLVE ONE TABLET UNDER THE TONGUE EVERY 5 MINUTES AS NEEDED FOR CHEST PAIN.  DO NOT EXCEED A TOTAL OF 3 DOSES IN 15 MINUTES 25 tablet 5   No current facility-administered medications for this visit.    Allergies:   Ace inhibitors    Social History:  The patient  reports that he quit smoking about 9 months ago. His smoking use included Cigarettes. He has a 22.5 pack-year smoking history. He has never used smokeless tobacco. He reports that he drinks alcohol. He reports that he does not use illicit drugs.   Family History:  The patient's family history includes Heart attack in his father; Hypertension in his father; Stroke in  his father.    ROS:  Please see the history of present illness.   Otherwise, review of systems are positive for none.   All other systems are reviewed and negative.    PHYSICAL EXAM: VS:  BP 140/96 mmHg  Pulse 93  Ht 5\' 9"  (1.753 m)  Wt 177 lb 12.8 oz (80.65 kg)  BMI 26.24 kg/m2 , BMI Body mass index is 26.24 kg/(m^2). GEN: Well nourished, well developed, in no acute distress HEENT: normal Neck: no JVD, carotid bruits, or masses Cardiac: RRR; no rubs, or gallops,no edema.  2/6 SM at LLSB Respiratory:  clear to auscultation bilaterally, normal work of breathing GI: soft, nontender, nondistended, + BS MS: no deformity or atrophy Skin: warm and dry, no rash Neuro:  Strength and sensation are intact Psych: euthymic mood, full affect   EKG:  EKG is not ordered today.    Recent Labs: 06/29/2014: Platelets 260.0 11/30/2014: BUN 10; Creatinine, Ser 1.20; Hemoglobin 16.3; Potassium 4.1; Sodium 141 02/12/2015: ALT 24    Lipid Panel    Component Value Date/Time   CHOL 168 02/12/2015 1257   TRIG 91 02/12/2015 1257   HDL 42 02/12/2015 1257   CHOLHDL 4.0 02/12/2015 1257   VLDL 18 02/12/2015 1257   LDLCALC 108 02/12/2015 1257      Wt Readings from Last 3 Encounters:  04/21/15 177 lb 12.8 oz (80.65 kg)  11/30/14 167 lb (75.751 kg)  11/02/14 160 lb (72.576 kg)      ASSESSMENT AND PLAN: 1. Chest pain - resolved after PCI of ramus-2 lesion 06/2014. He had 1 episode of atypical pain that lasted only a second and not like his prior angina.  I encouraged him to let me know if he has any of his typical angina.    2. Coronary artery disease with remote NSTEMI: S/P PCI of RI. Repeat cath 06/2014 showed prox Cx lesion 40% stenosed, prox LAD lesion 15% stenosed, DES in ramus intermediate is patent, Ramus-2 lesion 90% stenosed just distal to prior stent and underwent drug-eluting stent. - Continue aspirin, Brilinta, PCSK 9 drug, beta blocker. 3. Essential hypertension:borderline  Controlled but normal when he went to his PCP last week. Continue BB/ARB/ diuretic. Check BMET from PCP 4. HLD (hyperlipidemia): Continue Praulent/Zetia. Check FLP and ALT from PCP last week.  5. Former Cigarette Smoker: He has stopped smoking 6.  Heart murmur - get 2D echo to assess   Current medicines are reviewed at length with the patient today.  The patient does not have concerns regarding medicines.  The following changes have been made:  no change  Labs/ tests ordered today: See above Assessment and Plan No orders of the defined types were placed in this encounter.     Disposition:  FU with me in 6 months  Signed, Quintella Reichert, MD  04/21/2015 3:32 PM    [facility] [address] [place], [place], [place]  [address] Phone: [phone]; Fax: [phone]

## 2015-04-21 NOTE — Addendum Note (Signed)
Addended by: ,  K on: 04/21/2015 03:28 PM   Modules accepted: Orders  

## 2015-04-21 NOTE — Patient Instructions (Signed)
Medication Instructions:  Your physician recommends that you continue on your current medications as directed. Please refer to the Current Medication list given to you today.   Labwork: None  Testing/Procedures: Your physician has requested that you have an echocardiogram. Echocardiography is a painless test that uses sound waves to create images of your heart. It provides your doctor with information about the size and shape of your heart and how well your heart's chambers and valves are working. This procedure takes approximately one hour. There are no restrictions for this procedure.  Follow-Up: Your physician wants you to follow-up in: 6 months with Dr. Turner. You will receive a reminder letter in the mail two months in advance. If you don't receive a letter, please call our office to schedule the follow-up appointment.   Any Other Special Instructions Will Be Listed Below (If Applicable).     If you need a refill on your cardiac medications before your next appointment, please call your pharmacy.   

## 2015-04-23 ENCOUNTER — Other Ambulatory Visit: Payer: No Typology Code available for payment source

## 2015-04-26 ENCOUNTER — Ambulatory Visit
Admission: RE | Admit: 2015-04-26 | Discharge: 2015-04-26 | Disposition: A | Discharge status: Home / Self Care | Payer: No Typology Code available for payment source | Source: Ambulatory Visit | Attending: Internal Medicine | Admitting: Internal Medicine

## 2015-04-26 ENCOUNTER — Other Ambulatory Visit: Payer: Self-pay | Admitting: Internal Medicine

## 2015-04-26 DIAGNOSIS — R590 Localized enlarged lymph nodes: Secondary | ICD-10-CM

## 2015-04-27 NOTE — Progress Notes (Signed)
TWILIGHT research study 9 month visit completed. Patient did NOT return study medication , denies any adverse or bleeding events. Dispensed Bottles 3's [id number]; [id number] & [id number]. Instructed to remain on the ASA or PLACEBO provided by study and no open labeled ASA. Patient verbalized understanding. Next TWILIGHT research appointment window is 08/27/15-10/26/2015 Research office will call and schedule or combine visit with cardiologist in acceptable. At this visit the patient will return ALL study provided Brilinta and ASA/PLACEBO. FURTHER ANTIPLATELET THERAPY WILL BE AT THE DISCRETION OF THE CARDIOLOGIST. I will send an epic message to cardiologist in a timely manner so that a prescription can be called in if needed. Questions encouraged and answered.

## 2015-05-07 ENCOUNTER — Other Ambulatory Visit (HOSPITAL_COMMUNITY): Payer: No Typology Code available for payment source

## 2015-05-10 ENCOUNTER — Telehealth: Payer: Self-pay | Admitting: Cardiology

## 2015-05-10 ENCOUNTER — Emergency Department (HOSPITAL_COMMUNITY): Payer: No Typology Code available for payment source

## 2015-05-10 ENCOUNTER — Emergency Department (HOSPITAL_COMMUNITY)
Admission: EM | Admit: 2015-05-10 | Discharge: 2015-05-10 | Disposition: A | Discharge status: Home / Self Care | Payer: No Typology Code available for payment source | Attending: Emergency Medicine | Admitting: Emergency Medicine

## 2015-05-10 ENCOUNTER — Encounter (HOSPITAL_COMMUNITY): Payer: Self-pay | Admitting: *Deleted

## 2015-05-10 DIAGNOSIS — Z9889 Other specified postprocedural states: Secondary | ICD-10-CM | POA: Insufficient documentation

## 2015-05-10 DIAGNOSIS — I2510 Atherosclerotic heart disease of native coronary artery without angina pectoris: Secondary | ICD-10-CM | POA: Insufficient documentation

## 2015-05-10 DIAGNOSIS — I10 Essential (primary) hypertension: Secondary | ICD-10-CM | POA: Diagnosis not present

## 2015-05-10 DIAGNOSIS — Z8639 Personal history of other endocrine, nutritional and metabolic disease: Secondary | ICD-10-CM | POA: Diagnosis not present

## 2015-05-10 DIAGNOSIS — R079 Chest pain, unspecified: Secondary | ICD-10-CM | POA: Diagnosis present

## 2015-05-10 DIAGNOSIS — Z9861 Coronary angioplasty status: Secondary | ICD-10-CM | POA: Diagnosis not present

## 2015-05-10 DIAGNOSIS — Z79899 Other long term (current) drug therapy: Secondary | ICD-10-CM | POA: Insufficient documentation

## 2015-05-10 DIAGNOSIS — R0789 Other chest pain: Secondary | ICD-10-CM | POA: Diagnosis not present

## 2015-05-10 DIAGNOSIS — Z87891 Personal history of nicotine dependence: Secondary | ICD-10-CM | POA: Insufficient documentation

## 2015-05-10 LAB — CBC
HCT: 44.5 % (ref 39.0–52.0)
Hemoglobin: 15.4 g/dL (ref 13.0–17.0)
MCH: 28.8 pg (ref 26.0–34.0)
MCHC: 34.6 g/dL (ref 30.0–36.0)
MCV: 83.3 fL (ref 78.0–100.0)
PLATELETS: 257 10*3/uL (ref 150–400)
RBC: 5.34 MIL/uL (ref 4.22–5.81)
RDW: 15.1 % (ref 11.5–15.5)
WBC: 4.6 10*3/uL (ref 4.0–10.5)

## 2015-05-10 LAB — I-STAT TROPONIN, ED
Troponin i, poc: 0 ng/mL (ref 0.00–0.08)
Troponin i, poc: 0.01 ng/mL (ref 0.00–0.08)

## 2015-05-10 LAB — BASIC METABOLIC PANEL
Anion gap: 12 (ref 5–15)
BUN: 9 mg/dL (ref 6–20)
CHLORIDE: 104 mmol/L (ref 101–111)
CO2: 22 mmol/L (ref 22–32)
CREATININE: 1.09 mg/dL (ref 0.61–1.24)
Calcium: 9.7 mg/dL (ref 8.9–10.3)
GFR calc Af Amer: >60 mL/min (ref >60)
GFR calc non Af Amer: >60 mL/min (ref >60)
GLUCOSE: 98 mg/dL (ref 65–99)
Potassium: 3.8 mmol/L (ref 3.5–5.1)
SODIUM: 138 mmol/L (ref 135–145)

## 2015-05-10 IMAGING — DX DG CHEST 1V SAME DAY
1 series · 1 of 1 positions shown · non-contrast
Comparison: Prior film same day

CLINICAL DATA: Possible lung nodule or nipple shadow

EXAM:
CHEST - 1 VIEW SAME DAY

[chest pa]
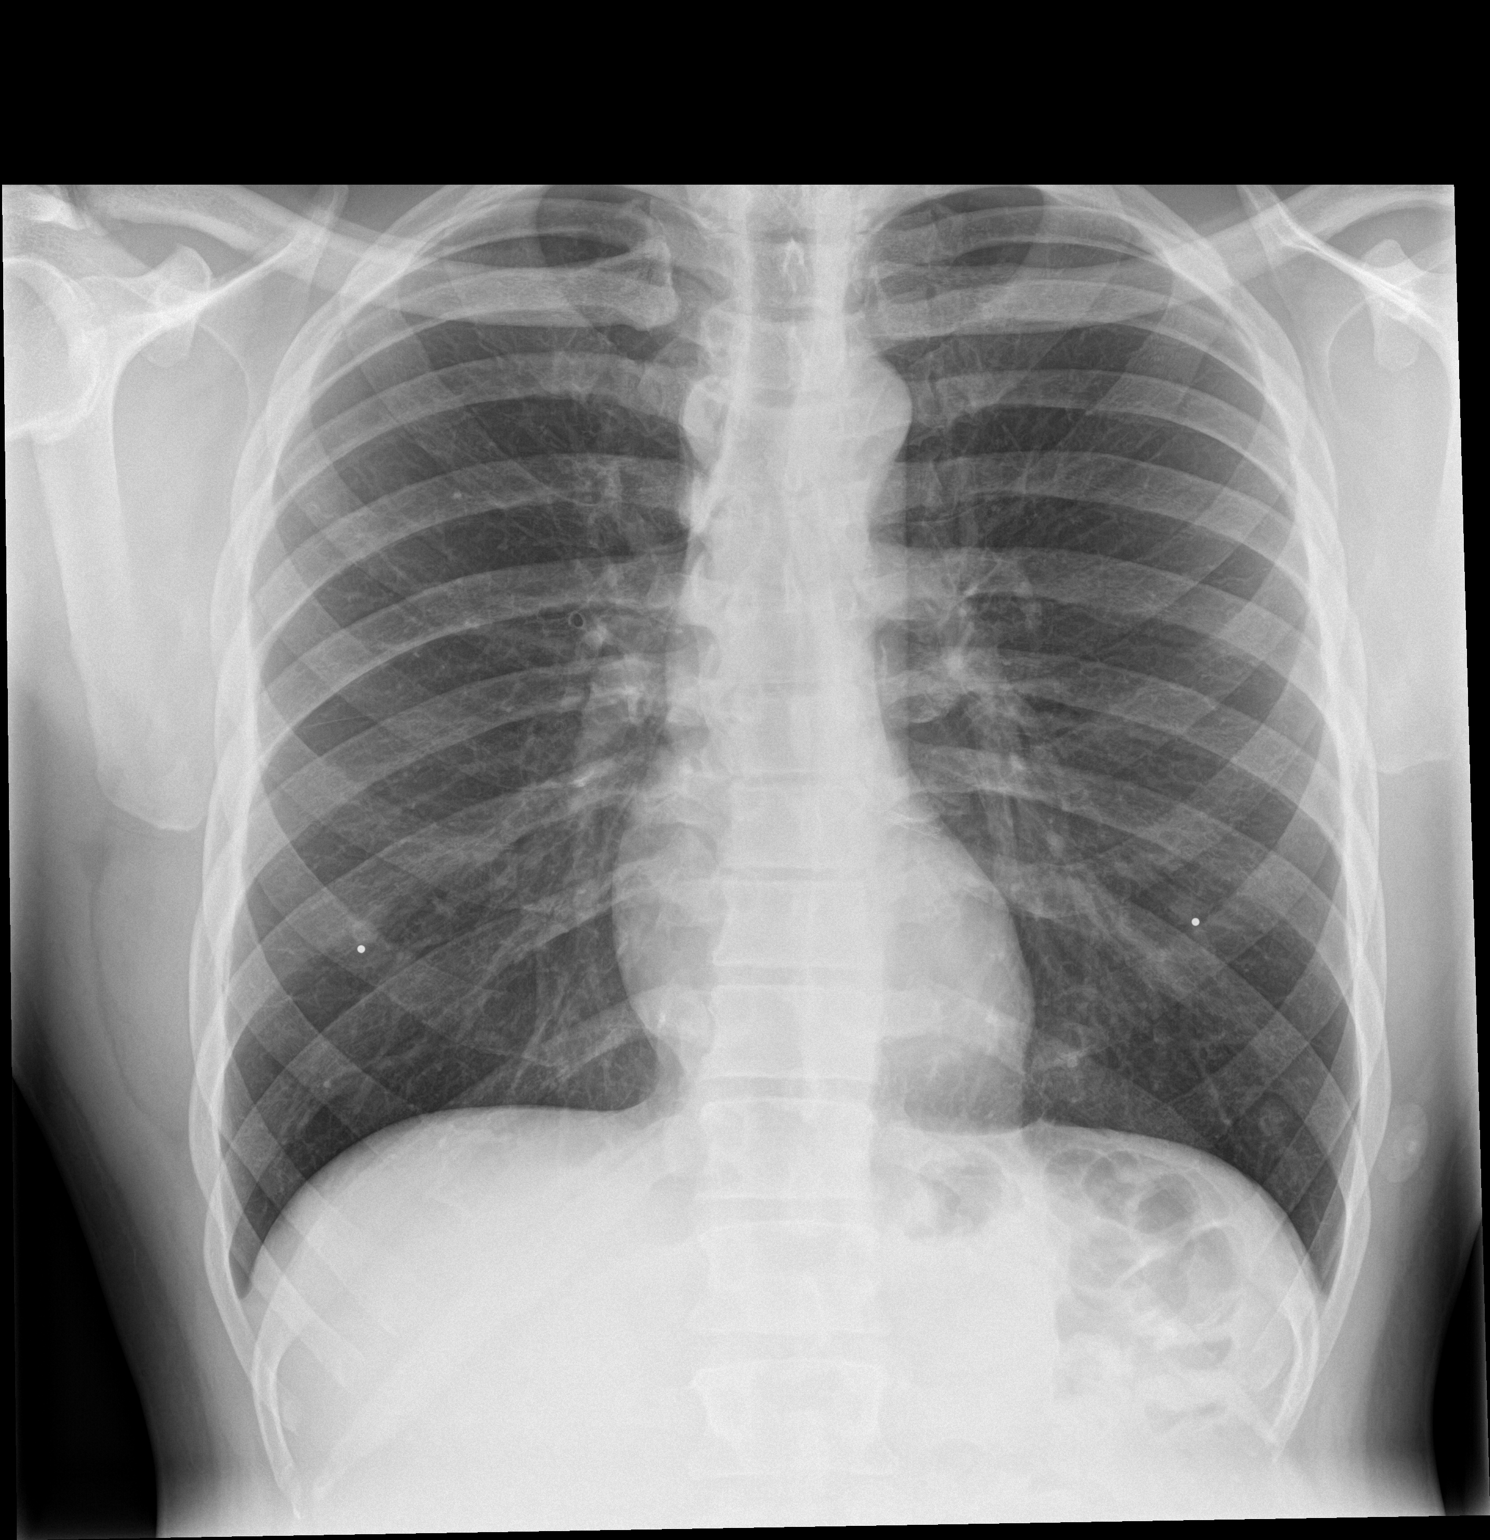

[1 of 1 positions shown; findings below may reference images not displayed]

FINDINGS: Cardiomediastinal silhouette is stable. Bilateral nipple markers are
noted. No evidence of lung nodules. Nodular density corresponds with
nipple. No infiltrate or pulmonary edema.
IMPRESSION: No active disease.  No pulmonary nodules.

## 2015-05-10 IMAGING — DX DG CHEST 2V
2 series · 2 of 2 positions shown · non-contrast
Comparison: [DATE]

CLINICAL DATA: Chest pressure with mild shortness of breath for 2
days

EXAM:
CHEST  2 VIEW

[chest pa]
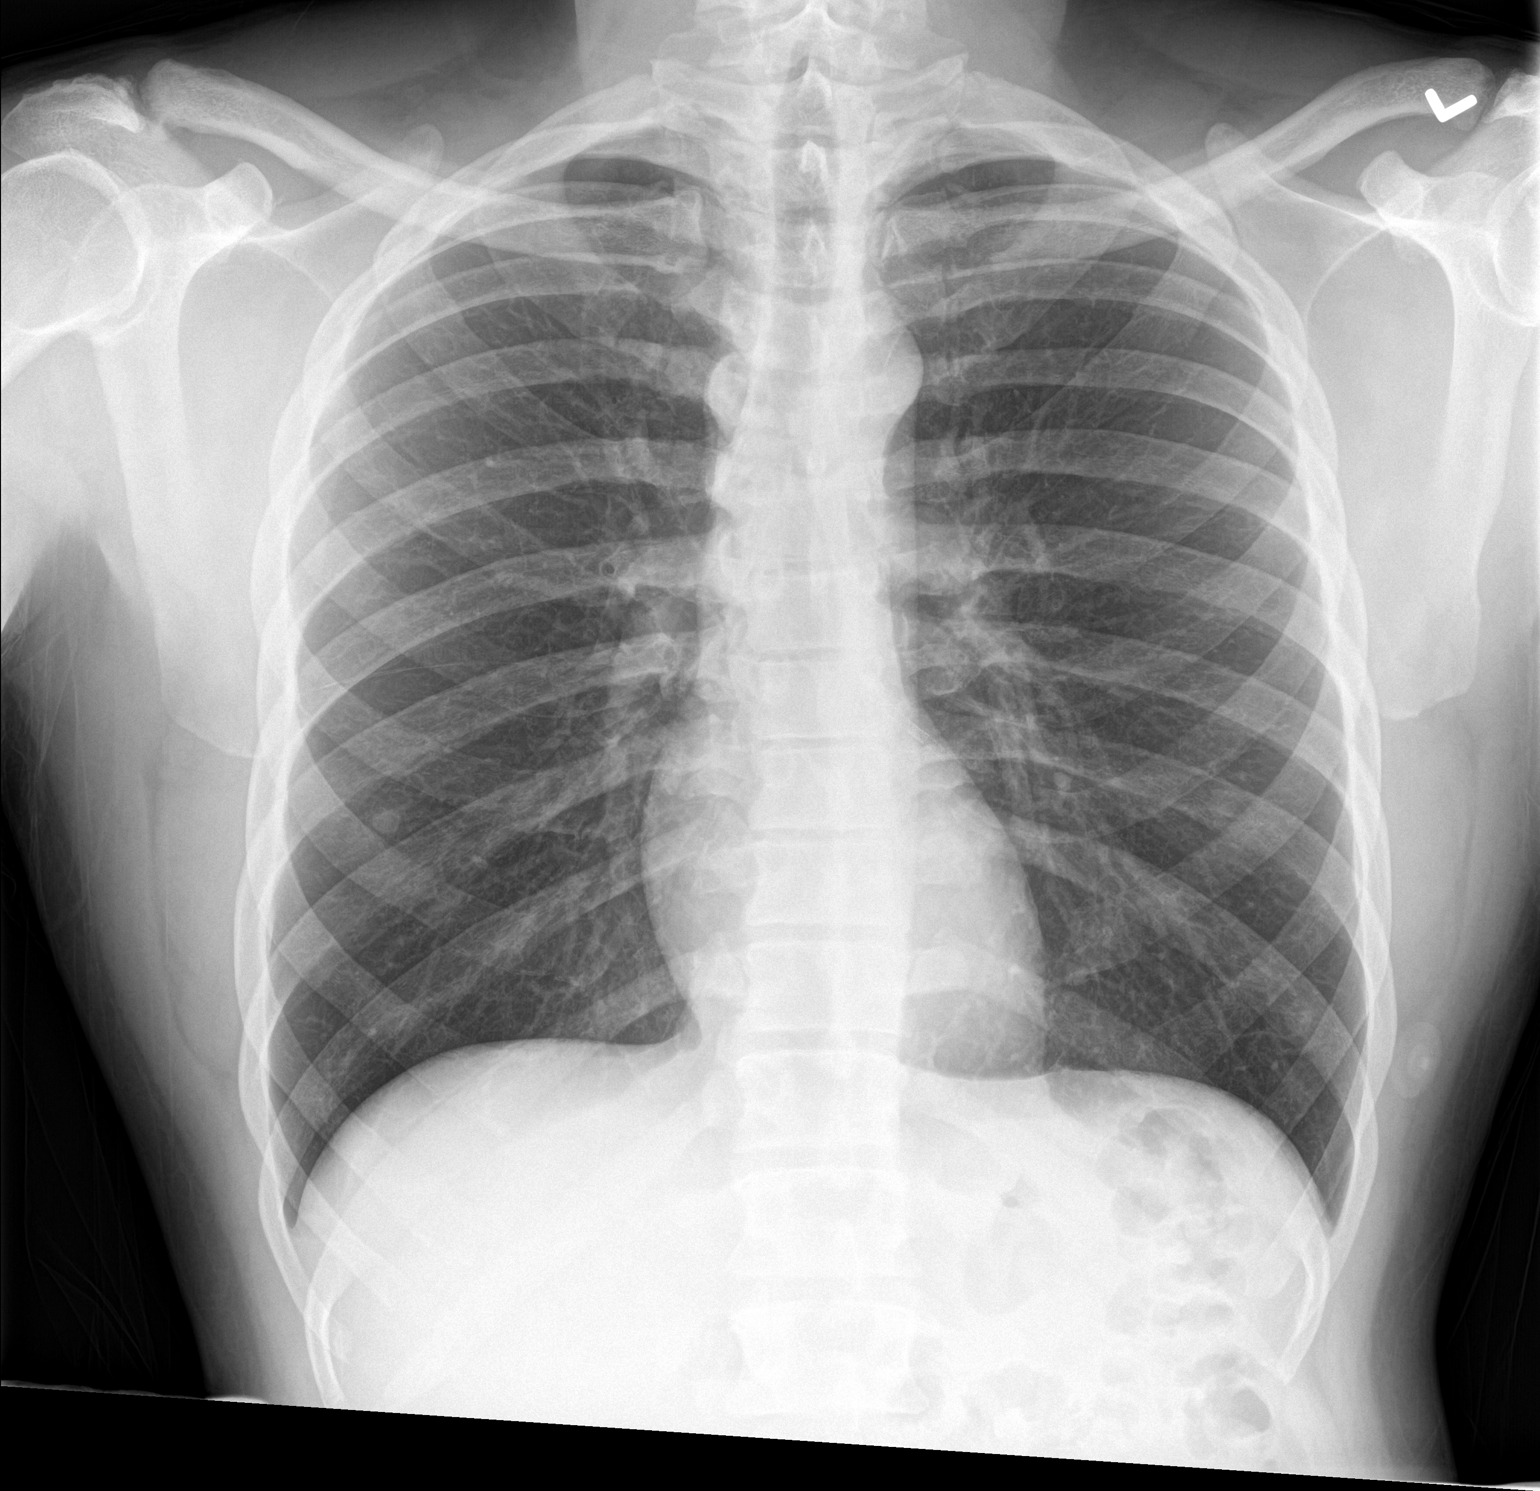

[chest lat]
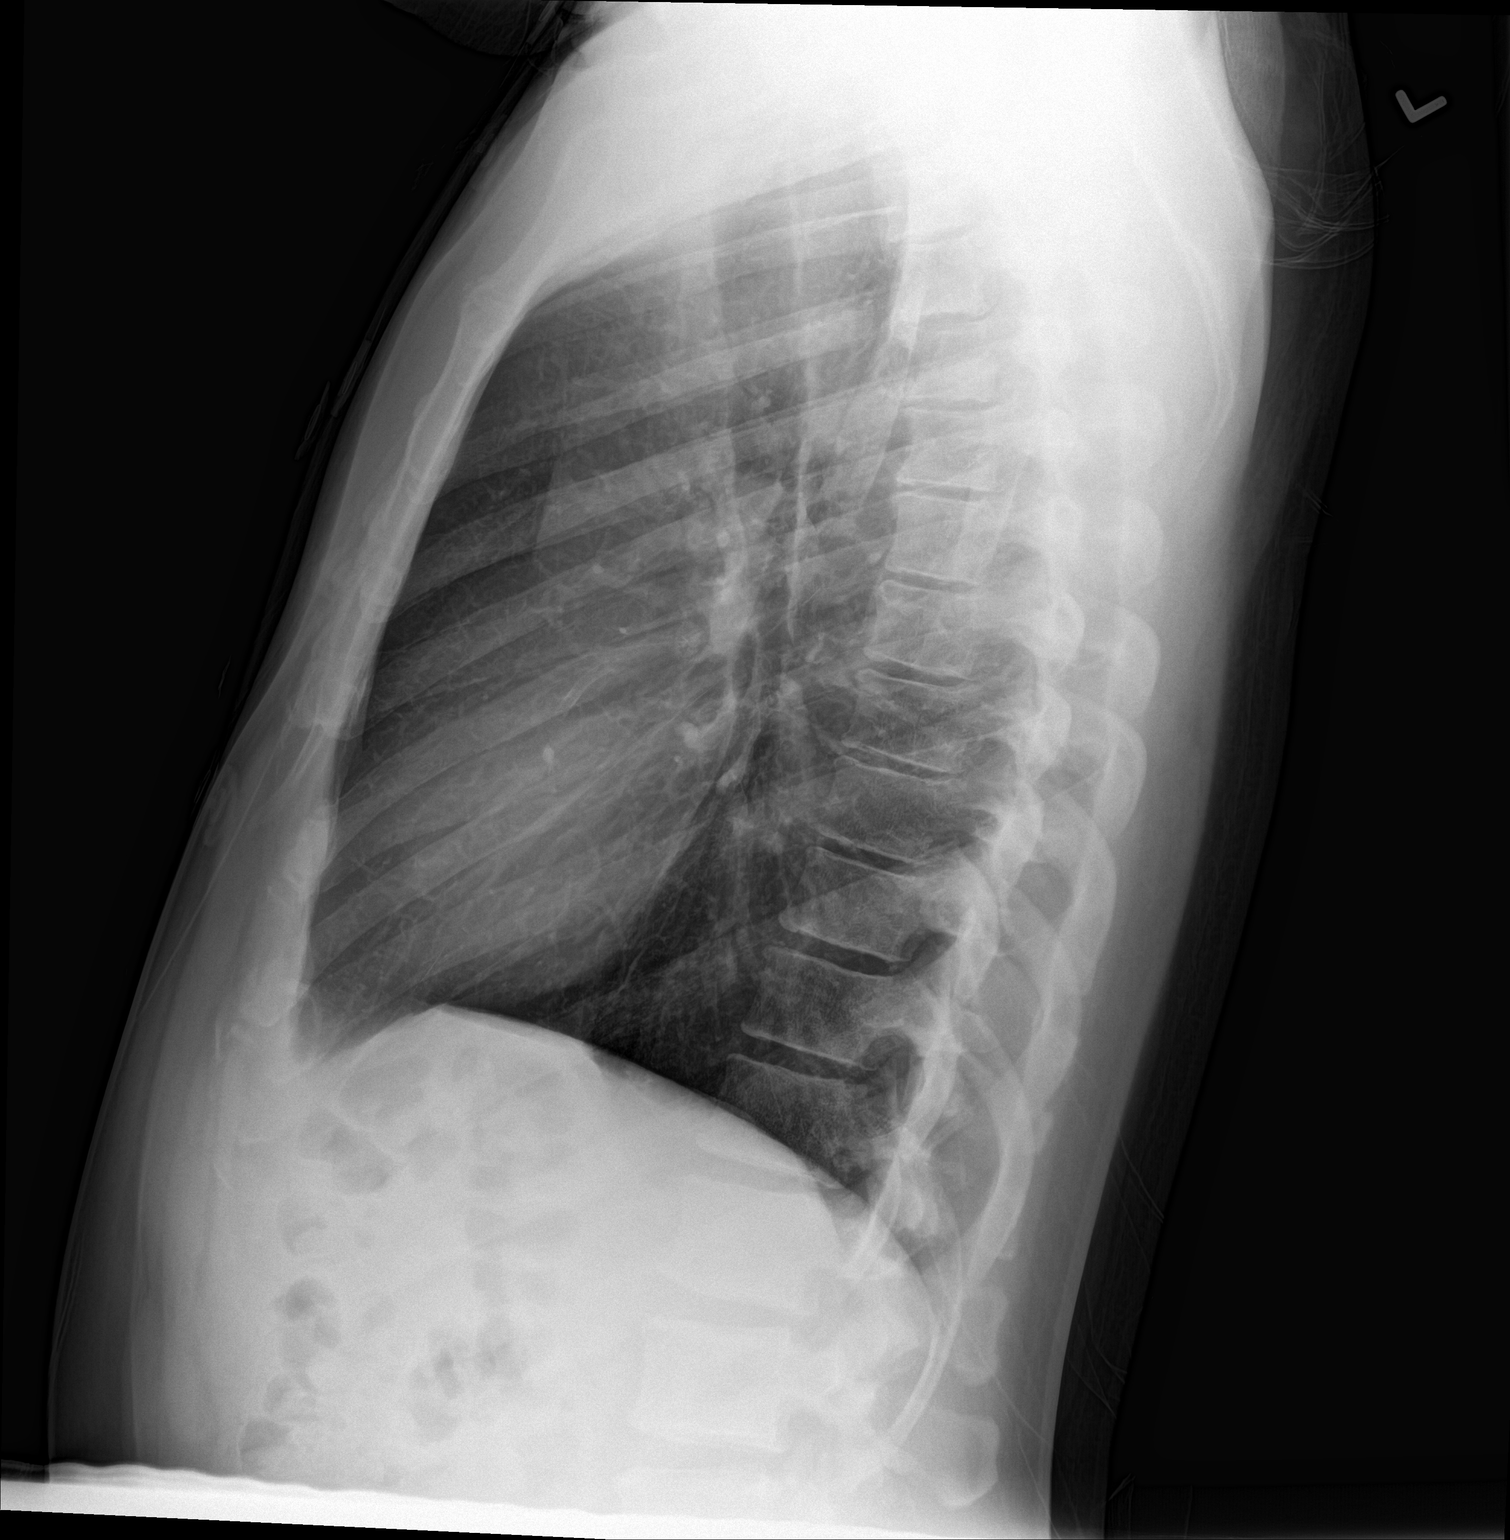

[2 of 2 positions shown; findings below may reference images not displayed]

FINDINGS: There are apparent nipple shadows bilaterally. There is no edema or
consolidation. The heart size and pulmonary vascularity are normal.
No adenopathy. No pneumothorax. No bone lesions.
IMPRESSION: Apparent nipple shadows bilaterally. Advise repeat study with nipple
markers to confirm. No edema or consolidation.

## 2015-05-10 NOTE — ED Notes (Signed)
Pt reports intermittent pressure in mid chest that started on Saturday. Had mild sob this am. No acute distress noted at triage, ekg done, airway intact.

## 2015-05-10 NOTE — Discharge Instructions (Signed)
Continue taking your medications as prescribed.   See your cardiologist.   Return to ER if you have worse chest pain, shortness of breath.

## 2015-05-10 NOTE — Telephone Encounter (Signed)
Pt walked in today c/o CP on and off since Friday.  Pt states that it occurred Friday and lasted until Sunday, easing off and on but completely resolved around 1pm on Sunday. Pt states CP returned this morning and resolved again at 1pm. Pt denies that it radiates anywhere. Advised pt to make next available appt and should get checked out at ER or Urgent Care at minimum since pain keeps occuring. Spoke with Dr. Mayford Knifeurner and she said to have pt go to ER.  Went by check out and informed pt of Dr. Norris Crossurner's recommendation. Pt verbalized understanding and was in agreement with this plan.

## 2015-05-10 NOTE — ED Provider Notes (Signed)
CSN: [id number]     Arrival date & time 05/10/15  1457 History   First MD Initiated Contact with Patient 05/10/15 2104     Chief Complaint  Patient presents with  . Chest Pain     (Consider location/radiation/quality/duration/timing/severity/associated sxs/prior Treatment) The history is provided by the patient.  Johna RolesJames M Wiemann is a 45 y.o. male hx of HTN, HL, CAD s/p 2 stents here with chest pain. Chest pain and pressure 2 days ago that resolved. He was at work today and was walking up the stairs and had some chest pain that resolved with rest. He denies shortness of breath or diaphoresis. He states that this is not as severe compared to his MI previously. He is taking brilinta currently and has been followed with Dr. Mayford Knifeurner from cardiology.    Past Medical History  Diagnosis Date  . Hypertension   . Hyperlipidemia LDL goal <70   . Low TSH level   . Tobacco abuse   . Hx of cardiovascular stress test     ETT/Lexiscan Myoview (11/15):  Inferior/inferolateral fixed defect c/w soft tissue attenuation and / or subendocardial scar No significant ischemia. Overall low to intermediate risk scan.LV Ejection Fraction: 51%.  Marland Kitchen. CAD (coronary artery disease)     a. s/p STEMI 10/2013 - DES to ramus intermedius. Patent LM, LAD, RCA, EF 55%;  b. 06/2014 Cath/PCI: LM nl, LAD 15p, RI patent stent prox, 3537m (2.5x12 Promus Premier DES), LCX 40p, EF 55-65%.   Past Surgical History  Procedure Laterality Date  . Finger fracture surgery Left 2014    4th finger  . Left heart catheterization with coronary angiogram N/A 10/20/2013    Procedure: LEFT HEART CATHETERIZATION WITH CORONARY ANGIOGRAM;  Surgeon: Corky CraftsJayadeep S Varanasi, MD;  [facility];  Service: Cardiovascular;  Laterality: N/A;  . Cardiac catheterization N/A 07/03/2014    Procedure: Left Heart Cath and Coronary Angiography;  Surgeon: Peter M SwazilandJordan, MD;  [facility];  Service: Cardiovascular;  Laterality: N/A;  . Cardiac  catheterization N/A 07/03/2014    Procedure: Coronary Stent Intervention;  Surgeon: Peter M SwazilandJordan, MD;  [facility];  Service: Cardiovascular;  Laterality: N/A;  . Percutaneous coronary stent intervention (pci-s)  07/03/2014    RAMUS    . Coronary angioplasty     Family History  Problem Relation Age of Onset  . Heart attack Father   . Hypertension Father   . Stroke Father    Social History  Substance Use Topics  . Smoking status: Former Smoker -- 0.75 packs/day for 30 years    Types: Cigarettes    Quit date: 07/03/2014  . Smokeless tobacco: Never Used  . Alcohol Use: Yes    Review of Systems  Cardiovascular: Positive for chest pain.  All other systems reviewed and are negative.     Allergies  Ace inhibitors  Home Medications   Prior to Admission medications   Medication Sig Start Date End Date Taking? Authorizing Provider  Alirocumab (PRALUENT) 75 MG/ML SOPN Inject 1 pen into the skin every 14 (fourteen) days. 02/22/15  Yes Quintella Reichertraci R Turner, MD  AMBULATORY NON FORMULARY MEDICATION Take 90 mg by mouth 2 (two) times daily. Medication Name:  Ticagrelor  90 mg BID provided by [employer] TWILIGHT Study 07/07/14  Yes Corky CraftsJayadeep S Varanasi, MD  AMBULATORY NON FORMULARY MEDICATION Take 81 mg by mouth daily. Medication Name: ASA 81 mg daily or PLACEBO (TWILIGHT Research Study) 10/23/14  Yes Kathleene Hazelhristopher D McAlhany, MD  hydrochlorothiazide (HYDRODIURIL) 25 MG tablet Take 1 tablet (25 mg total) by mouth daily. 11/13/14  Yes Quintella Reichert, MD  losartan (COZAAR) 50 MG tablet Take 1 tablet (50 mg total) by mouth daily. 11/13/14  Yes Quintella Reichert, MD  NITROSTAT 0.4 MG SL tablet DISSOLVE ONE TABLET UNDER THE TONGUE EVERY 5 MINUTES AS NEEDED FOR CHEST PAIN.  DO NOT EXCEED A TOTAL OF 3 DOSES IN 15 MINUTES 12/10/14  Yes Quintella Reichert, MD  atorvastatin (LIPITOR) 80 MG tablet TAKE ONE TABLET BY MOUTH ONCE DAILY AT  6PM Patient not taking: Reported on 05/10/2015 07/07/14   Quintella Reichert, MD   ezetimibe (ZETIA) 10 MG tablet Take 1 tablet (10 mg total) by mouth daily. Patient not taking: Reported on 05/10/2015 01/08/14   Quintella Reichert, MD   BP 124/94 mmHg  Pulse 77  Temp(Src) 98.2 F (36.8 C) (Oral)  Resp 14  SpO2 98% Physical Exam  Constitutional: He is oriented to person, place, and time. He appears well-developed and well-nourished.  HENT:  Head: Normocephalic.  Mouth/Throat: Oropharynx is clear and moist.  Eyes: Conjunctivae are normal. Pupils are equal, round, and reactive to light.  Neck: Normal range of motion. Neck supple.  Cardiovascular: Normal rate, regular rhythm and normal heart sounds.   Pulmonary/Chest: Effort normal and breath sounds normal. No respiratory distress. He has no wheezes. He has no rales.  Abdominal: Soft. Bowel sounds are normal. He exhibits no distension. There is no tenderness. There is no rebound.  Musculoskeletal: Normal range of motion. He exhibits no edema or tenderness.  Neurological: He is alert and oriented to person, place, and time. No cranial nerve deficit. Coordination normal.  Skin: Skin is warm and dry.  Psychiatric: He has a normal mood and affect. His behavior is normal. Judgment and thought content normal.  Nursing note and vitals reviewed.   ED Course  Procedures (including critical care time) Labs Review Labs Reviewed  BASIC METABOLIC PANEL  CBC  I-STAT TROPOININ, ED  Rosezena Sensor, ED    Imaging Review Dg Chest 2 View  05/10/2015  CLINICAL DATA:  Chest pressure with mild shortness of breath for 2 days EXAM: CHEST  2 VIEW COMPARISON:  October 18, 2013 FINDINGS: There are apparent nipple shadows bilaterally. There is no edema or consolidation. The heart size and pulmonary vascularity are normal. No adenopathy. No pneumothorax. No bone lesions. IMPRESSION: Apparent nipple shadows bilaterally. Advise repeat study with nipple markers to confirm. No edema or consolidation. Electronically Signed   By: Bretta Bang  III M.D.   On: 05/10/2015 15:57   Dg Chest 1v Repeat Same Day  05/10/2015  CLINICAL DATA:  Possible lung nodule or nipple shadow EXAM: CHEST - 1 VIEW SAME DAY COMPARISON:  Prior film same day FINDINGS: Cardiomediastinal silhouette is stable. Bilateral nipple markers are noted. No evidence of lung nodules. Nodular density corresponds with nipple. No infiltrate or pulmonary edema. IMPRESSION: No active disease.  No pulmonary nodules. Electronically Signed   By: Natasha Mead M.D.   On: 05/10/2015 18:27   I have personally reviewed and evaluated these images and lab results as part of my medical decision-making.   EKG Interpretation   Date/Time:  Monday May 10 2015 15:02:04 EDT Ventricular Rate:  98 PR Interval:  164 QRS Duration: 88 QT Interval:  322 QTC Calculation: 411 R Axis:   66 Text Interpretation:  Normal sinus rhythm Moderate voltage criteria for  LVH, may be normal variant Borderline ECG No  significant change since last  tracing Confirmed by   MD,  ([id number]) on 05/10/2015 8:32:43 PM      MDM   Final diagnoses:  Chest pain    COURTLAND REAS is a 45 y.o. male here with chest pain. Hx of CAD with 2 stents but patient has been pain free since being in the ED. Patient well appearing. States that this is not as severe as previous MI. Will get delta trop. Has cardiology follow up.   11:41 PM Patient waiting in the waiting room for about 6 hrs. Second trop about 7 hrs from the first one is neg. I doubt ACS. Will dc home.   Richardean Canal, MD 05/10/15 [phone]

## 2015-05-10 NOTE — ED Notes (Signed)
Signature pad in room malfunctioned and would not accept signature. Pt departed in NAD.

## 2015-05-14 ENCOUNTER — Other Ambulatory Visit (HOSPITAL_COMMUNITY): Payer: No Typology Code available for payment source

## 2015-05-21 ENCOUNTER — Telehealth: Payer: Self-pay | Admitting: *Deleted

## 2015-05-21 NOTE — Telephone Encounter (Signed)
S/w pt is aware of new appointment and time

## 2015-05-28 ENCOUNTER — Ambulatory Visit (HOSPITAL_COMMUNITY): Payer: No Typology Code available for payment source

## 2015-06-04 ENCOUNTER — Ambulatory Visit: Payer: No Typology Code available for payment source | Admitting: Nurse Practitioner

## 2015-06-15 ENCOUNTER — Ambulatory Visit (INDEPENDENT_AMBULATORY_CARE_PROVIDER_SITE_OTHER): Payer: No Typology Code available for payment source | Admitting: Nurse Practitioner

## 2015-06-15 ENCOUNTER — Encounter: Payer: Self-pay | Admitting: Nurse Practitioner

## 2015-06-15 VITALS — BP 140/88 | HR 96 | Ht 69.0 in | Wt 173.1 lb

## 2015-06-15 DIAGNOSIS — I2510 Atherosclerotic heart disease of native coronary artery without angina pectoris: Secondary | ICD-10-CM | POA: Diagnosis not present

## 2015-06-15 DIAGNOSIS — Z9861 Coronary angioplasty status: Secondary | ICD-10-CM

## 2015-06-15 NOTE — Progress Notes (Signed)
CARDIOLOGY OFFICE NOTE  Date:  06/15/2015    Joseph Carr Date of Birth: [birth year] Medical Record #[id number]  PCP:  Tommy Rainwater, MD  Cardiologist:  Mayford Knife    Chief Complaint  Patient presents with  . Chest Pain  . Coronary Artery Disease    Work in visit - seen for Dr. Mayford Knife    History of Present Illness: Joseph Carr is a 45 y.o. male who presents today for a work in visit. Seen for Dr. Mayford Knife.   He has a history of HTN, HLD, hypothyroidism, tobacco abuse, and CAD with STEMi in 2015 with DES stent to ramus intermediate. Repeat cath in May of 2016 for CP showed ramus-2 lesion 90% stenosed just distal to prior stent and underwent drug-eluting stent.   Last seen here back in March and was doing well. Noted to have heart murmur and is for an echo later this week.   In the ER a month ago with chest pain - negative evaluation. Told to follow up here.  Comes in today. Here alone. Says he is doing well. No more chest pain but has a sensation when he chews only Winterfresh or some type of Mexican gum where he will have a sensation in his chest - does not occur with other gums. He has no sensation if not chewing any gum. No chest pain that is suggestive of his prior chest pain syndrome. Says he is active. No exertional symptoms. For echo later this week.   Past Medical History  Diagnosis Date  . Hypertension   . Hyperlipidemia LDL goal <70   . Low TSH level   . Tobacco abuse   . Hx of cardiovascular stress test     ETT/Lexiscan Myoview (11/15):  Inferior/inferolateral fixed defect c/w soft tissue attenuation and / or subendocardial scar No significant ischemia. Overall low to intermediate risk scan.LV Ejection Fraction: 51%.  Marland Kitchen CAD (coronary artery disease)     a. s/p STEMI 10/2013 - DES to ramus intermedius. Patent LM, LAD, RCA, EF 55%;  b. 06/2014 Cath/PCI: LM nl, LAD 15p, RI patent stent prox, 26m (2.5x12 Promus Premier DES), LCX 40p, EF 55-65%.     Past Surgical History  Procedure Laterality Date  . Finger fracture surgery Left 2014    4th finger  . Left heart catheterization with coronary angiogram N/A 10/20/2013    Procedure: LEFT HEART CATHETERIZATION WITH CORONARY ANGIOGRAM;  Surgeon: Corky Crafts, MD;  Location: [facility] CATH LAB;  Service: Cardiovascular;  Laterality: N/A;  . Cardiac catheterization N/A 07/03/2014    Procedure: Left Heart Cath and Coronary Angiography;  Surgeon: Peter M Swaziland, MD;  Location: [facility] INVASIVE CV LAB;  Service: Cardiovascular;  Laterality: N/A;  . Cardiac catheterization N/A 07/03/2014    Procedure: Coronary Stent Intervention;  Surgeon: Peter M Swaziland, MD;  Location: [facility] INVASIVE CV LAB;  Service: Cardiovascular;  Laterality: N/A;  . Percutaneous coronary stent intervention (pci-s)  07/03/2014    RAMUS    . Coronary angioplasty       Medications: Current Outpatient Prescriptions  Medication Sig Dispense Refill  . Alirocumab (PRALUENT) 75 MG/ML SOPN Inject 1 pen into the skin every 14 (fourteen) days. 2 pen 11  . AMBULATORY NON FORMULARY MEDICATION Take 90 mg by mouth 2 (two) times daily. Medication Name:  Ticagrelor  90 mg BID provided by [employer] TWILIGHT Study    . AMBULATORY NON FORMULARY MEDICATION Take 81 mg by mouth daily. Medication Name: ASA 81 mg  daily or PLACEBO (TWILIGHT Research Study)    . atorvastatin (LIPITOR) 80 MG tablet TAKE ONE TABLET BY MOUTH ONCE DAILY AT  6PM 30 tablet 5  . ezetimibe (ZETIA) 10 MG tablet Take 1 tablet (10 mg total) by mouth daily. 90 tablet 3  . hydrochlorothiazide (HYDRODIURIL) 25 MG tablet Take 1 tablet (25 mg total) by mouth daily. 90 tablet 3  . losartan (COZAAR) 50 MG tablet Take 1 tablet (50 mg total) by mouth daily. 90 tablet 3  . NITROSTAT 0.4 MG SL tablet DISSOLVE ONE TABLET UNDER THE TONGUE EVERY 5 MINUTES AS NEEDED FOR CHEST PAIN.  DO NOT EXCEED A TOTAL OF 3 DOSES IN 15 MINUTES 25 tablet 5   No current facility-administered medications for this  visit.    Allergies: Allergies  Allergen Reactions  . Ace Inhibitors Swelling    Lip swelling that resolved with benadryl, did not compromise his breathing    Social History: The patient  reports that he quit smoking about a year ago. His smoking use included Cigarettes. He has a 22.5 pack-year smoking history. He has never used smokeless tobacco. He reports that he drinks alcohol. He reports that he does not use illicit drugs.   Family History: The patient's family history includes Heart attack in his father; Hypertension in his father; Stroke in his father.   Review of Systems: Please see the history of present illness.   Otherwise, the review of systems is positive for none.   All other systems are reviewed and negative.   Physical Exam: VS:  BP 140/88 mmHg  Pulse 96  Ht 5\' 9"  (1.753 m)  Wt 173 lb 1.9 oz (78.527 kg)  BMI 25.55 kg/m2 .  BMI Body mass index is 25.55 kg/(m^2).  Wt Readings from Last 3 Encounters:  06/15/15 173 lb 1.9 oz (78.527 kg)  04/21/15 177 lb 12.8 oz (80.65 kg)  11/30/14 167 lb (75.751 kg)    BP is 130/80 by me.   General: Pleasant. Well developed, well nourished and in no acute distress.  HEENT: Normal. Neck: Supple, no JVD, carotid bruits, or masses noted.  Cardiac: Regular rate and rhythm. Very soft outflow murmur. No edema.  Respiratory:  Lungs are clear to auscultation bilaterally with normal work of breathing.  GI: Soft and nontender.  MS: No deformity or atrophy. Gait and ROM intact. Skin: Warm and dry. Color is normal.  Neuro:  Strength and sensation are intact and no gross focal deficits noted.  Psych: Alert, appropriate and with normal affect.   LABORATORY DATA:  EKG:  EKG is not ordered today.  Lab Results  Component Value Date   WBC 4.6 05/10/2015   HGB 15.4 05/10/2015   HCT 44.5 05/10/2015   PLT 257 05/10/2015   GLUCOSE 98 05/10/2015   CHOL 168 02/12/2015   TRIG 91 02/12/2015   HDL 42 02/12/2015   LDLCALC 108 02/12/2015    ALT 24 02/12/2015   AST 35 02/12/2015   NA 138 05/10/2015   K 3.8 05/10/2015   CL 104 05/10/2015   CREATININE 1.09 05/10/2015   BUN 9 05/10/2015   CO2 22 05/10/2015   TSH 0.32* 10/30/2013   INR 0.9 06/29/2014    BNP (last 3 results) No results for input(s): BNP in the last 8760 hours.  ProBNP (last 3 results) No results for input(s): PROBNP in the last 8760 hours.   Other Studies Reviewed Today: Conclusion from last cardiac cath 06/2014    Prox Cx lesion, 40% stenosed.  Prox LAD lesion, 15% stenosed.  DES in ramus intermediate is patent  Ramus-2 lesion, 90% stenosed just distal to prior stent. There is a 0% residual stenosis post intervention.  A drug-eluting stent was placed.  Normal LV function.  Recommendation: Continue DAPT. Anticipate DC later today.       Assessment/Plan: 1. Atypical chest pain - with recent ER visit. He is now doing well. This sensation he has with just chewing gum seems very atypical - would just refrain from chewing gum -consider stress testing if persists.   2. CAD with last cath a year ago. Doing well. No change with his current regimen.   3. Murmur - for echo later this week  4. HTN - repeat by me is better. I have left him on his current regimen.   Current medicines are reviewed with the patient today.  The patient does not have concerns regarding medicines other than what has been noted above.  The following changes have been made:  See above.  Labs/ tests ordered today include:   No orders of the defined types were placed in this encounter.     Disposition:   FU with Dr. Mayford Knife as planned.    Patient is agreeable to this plan and will call if any problems develop in the interim.   Signed: Rosalio Macadamia, RN, ANP-C 06/15/2015 2:13 PM  [facility] [address] [place], [place]  [address] Phone: [phone] Fax: [phone]

## 2015-06-15 NOTE — Patient Instructions (Addendum)
We will be checking the following labs today - NONE   Medication Instructions:    Continue with your current medicines.     Testing/Procedures To Be Arranged:  Echo as planned.   Follow-Up:   See Dr. Mayford Knifeurner back in September    Other Special Instructions:   N/A    If you need a refill on your cardiac medications before your next appointment, please call your pharmacy.   Call the [facility] office at [phone] if you have any questions, problems or concerns.

## 2015-06-18 ENCOUNTER — Ambulatory Visit (HOSPITAL_COMMUNITY): Payer: No Typology Code available for payment source | Attending: Cardiovascular Disease

## 2015-06-18 ENCOUNTER — Other Ambulatory Visit: Payer: Self-pay

## 2015-06-18 DIAGNOSIS — I10 Essential (primary) hypertension: Secondary | ICD-10-CM | POA: Insufficient documentation

## 2015-06-18 DIAGNOSIS — I341 Nonrheumatic mitral (valve) prolapse: Secondary | ICD-10-CM | POA: Diagnosis not present

## 2015-06-18 DIAGNOSIS — Z720 Tobacco use: Secondary | ICD-10-CM | POA: Diagnosis not present

## 2015-06-18 DIAGNOSIS — E785 Hyperlipidemia, unspecified: Secondary | ICD-10-CM | POA: Diagnosis not present

## 2015-06-18 DIAGNOSIS — R011 Cardiac murmur, unspecified: Secondary | ICD-10-CM | POA: Insufficient documentation

## 2015-10-12 ENCOUNTER — Other Ambulatory Visit: Payer: Self-pay | Admitting: Cardiology

## 2015-10-18 ENCOUNTER — Telehealth: Payer: Self-pay

## 2015-10-18 NOTE — Telephone Encounter (Signed)
Both numbers on file for patient and wife are no longer working. Called PCP to verify number.  She provided new work number - [phone].  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  Left message to call back and supplied office number.

## 2015-10-18 NOTE — Telephone Encounter (Signed)
-----   Message from Quintella Reichertraci R Turner, MD sent at 10/12/2015  9:49 AM EDT ----- Regarding: RE: TWILIGHT End of Study Contact: [phone] Orpha BurKaty,  Please change Mr. Kunesh from [place] CrestBrilinta to Plavix 75mg  daily starting 10/26/2015  Armanda Magicraci turner, MD ----- Message ----- From: Orrin Brighamammy W Hedrick, RN Sent: 10/12/2015   7:52 AM To: Quintella Reichertraci R Turner, MD, Oletha CruelHugh W Pruitt, RN, # Subject: TWILIGHT End of Study                          Dr. Mayford Knifeurner,         You will be seeing Mr. Shiner 10/26/15 @ 2:30. This will be the End of the TWILIGHT research study. Further Antiplatelet therapy (including ASA) will be at your discretion. He will no longer be provided Brilinta or ASA.         Research will either see him at this appointment or have him to come by the Research office after your visit.   If you have any questions regarding the TWILIGHT Protocol please do not hesitate to contact the research office.  Thank You!  Tammy

## 2015-10-19 MED ORDER — CLOPIDOGREL BISULFATE 75 MG PO TABS: 75.0000 mg | ORAL_TABLET | Freq: Every day | ORAL | 11 refills | Status: DC

## 2015-10-19 NOTE — Telephone Encounter (Signed)
Follow up   Pt returned rn call

## 2015-10-19 NOTE — Telephone Encounter (Signed)
Follow up   Pt verbalized that he is returning call for rn, he said he is at work and he will call back at 4:20pm  Pt did not disclose a new number

## 2015-10-19 NOTE — Telephone Encounter (Signed)
Instructed patient to take last dose of BRILINTA 9/18 and START PLAVIX 75 mg daily on 10/26/2015. Phone numbers updated in chart. Patient agrees with treatment plan.

## 2015-10-24 NOTE — Progress Notes (Signed)
Cardiology Office Note    Date:  10/26/2015   ID:  Joseph RolesJames M Kocurek, DOB 26-May-1970, MRN [id number]  PCP:  Clelia SchaumannIbethal J Shamleffer, MD  Cardiologist:  Armanda Magicraci , MD   Chief Complaint  Patient presents with  . Coronary Artery Disease  . Hypertension  . Hyperlipidemia    History of Present Illness:  Joseph Carr is a 45 y.o. male with a history of CAD s/p NSTEMI 10/2013 s/p PCI with DES to RI with.repeat cath for CP showing ramus-2 lesion 90% stenosed just distal to prior stent and underwent drug-eluting stent. He also has HTN, tobacco abuse and  HL.  Since his PCI he has not had any further anginal CP and denies any SOB.   He denies significant orthopnea, PND, edema. He denies syncope, dizziness or palpitations.    Past Medical History:  Diagnosis Date  . CAD (coronary artery disease)    a. s/p STEMI 10/2013 - DES to ramus intermedius. Patent LM, LAD, RCA, EF 55%;  b. 06/2014 Cath/PCI: LM nl, LAD 15p, RI patent stent prox, 7749m (2.5x12 Promus Premier DES), LCX 40p, EF 55-65%.  Marland Kitchen. Hx of cardiovascular stress test    ETT/Lexiscan Myoview (11/15):  Inferior/inferolateral fixed defect c/w soft tissue attenuation and / or subendocardial scar No significant ischemia. Overall low to intermediate risk scan.LV Ejection Fraction: 51%.  . Hyperlipidemia LDL goal <70   . Hypertension   . Low TSH level   . Tobacco abuse     Past Surgical History:  Procedure Laterality Date  . CARDIAC CATHETERIZATION N/A 07/03/2014   Procedure: Left Heart Cath and Coronary Angiography;  Surgeon: Peter M SwazilandJordan, MD;  [facility];  Service: Cardiovascular;  Laterality: N/A;  . CARDIAC CATHETERIZATION N/A 07/03/2014   Procedure: Coronary Stent Intervention;  Surgeon: Peter M SwazilandJordan, MD;  [facility];  Service: Cardiovascular;  Laterality: N/A;  . CORONARY ANGIOPLASTY    . FINGER FRACTURE SURGERY Left 2014   4th finger  . LEFT HEART CATHETERIZATION WITH CORONARY ANGIOGRAM N/A  10/20/2013   Procedure: LEFT HEART CATHETERIZATION WITH CORONARY ANGIOGRAM;  Surgeon: Corky CraftsJayadeep S Varanasi, MD;  [facility];  Service: Cardiovascular;  Laterality: N/A;  . PERCUTANEOUS CORONARY STENT INTERVENTION (PCI-S)  07/03/2014   RAMUS      Current Medications: Outpatient Medications Prior to Visit  Medication Sig Dispense Refill  . Alirocumab (PRALUENT) 75 MG/ML SOPN Inject 1 pen into the skin every 14 (fourteen) days. 2 pen 11  . AMBULATORY NON FORMULARY MEDICATION Take 90 mg by mouth 2 (two) times daily. Medication Name:  Ticagrelor  90 mg BID provided by Research TWILIGHT Study    . AMBULATORY NON FORMULARY MEDICATION Take 81 mg by mouth daily. Medication Name: ASA 81 mg daily or PLACEBO (TWILIGHT Research Study)    . atorvastatin (LIPITOR) 80 MG tablet TAKE ONE TABLET BY MOUTH ONCE DAILY AT  6PM 30 tablet 5  . hydrochlorothiazide (HYDRODIURIL) 25 MG tablet Take 1 tablet (25 mg total) by mouth daily. 90 tablet 3  . losartan (COZAAR) 50 MG tablet Take 1 tablet (50 mg total) by mouth daily. 90 tablet 3  . nitroGLYCERIN (NITROSTAT) 0.4 MG SL tablet DISSOLVE ONE TABLET UNDER THE TONGUE EVERY 5 MINUTES AS NEEDED FOR CHEST PAIN.  DO NOT EXCEED A TOTAL OF 3 DOSES IN 15 MINUTES 25 tablet 5  . clopidogrel (PLAVIX) 75 MG tablet Take 1 tablet (75 mg total) by mouth daily. Start 10/26/2015. (Patient not  taking: Reported on 10/26/2015) 30 tablet 11  . ezetimibe (ZETIA) 10 MG tablet Take 1 tablet (10 mg total) by mouth daily. (Patient not taking: Reported on 10/26/2015) 90 tablet 3   No facility-administered medications prior to visit.      Allergies:   Ace inhibitors   Social History   Social History  . Marital status: Married    Spouse name: N/A  . Number of children: N/A  . Years of education: N/A   Occupational History  . Works in Orthoptist    Social History Main Topics  . Smoking status: Former Smoker    Packs/day: 0.75    Years: 30.00    Types: Cigarettes    Quit date:  07/03/2014  . Smokeless tobacco: Never Used  . Alcohol use Yes  . Drug use: No  . Sexual activity: Not Asked   Other Topics Concern  . None   Social History Narrative   Lives with family     Family History:  The patient's family history includes Heart attack in his father; Hypertension in his father; Stroke in his father.   ROS:   Please see the history of present illness.    ROS All other systems reviewed and are negative.  No flowsheet data found.     PHYSICAL EXAM:   VS:  BP (!) 150/94   Pulse 97   Ht 5\' 9"  (1.753 m)   Wt 169 lb 1.9 oz (76.7 kg)   SpO2 96%   BMI 24.97 kg/m    GEN: Well nourished, well developed, in no acute distress  HEENT: normal  Neck: no JVD, carotid bruits, or masses Cardiac: RRR; no murmurs, rubs, or gallops,no edema.  Intact distal pulses bilaterally.  Respiratory:  clear to auscultation bilaterally, normal work of breathing GI: soft, nontender, nondistended, + BS MS: no deformity or atrophy  Skin: warm and dry, no rash Neuro:  Alert and Oriented x 3, Strength and sensation are intact Psych: euthymic mood, full affect  Wt Readings from Last 3 Encounters:  10/26/15 169 lb 1.9 oz (76.7 kg)  06/15/15 173 lb 1.9 oz (78.5 kg)  04/21/15 177 lb 12.8 oz (80.6 kg)      Studies/Labs Reviewed:   EKG:  EKG is ordered today.  The ekg ordered today demonstrates   Recent Labs: 02/12/2015: ALT 24 05/10/2015: BUN 9; Creatinine, Ser 1.09; Hemoglobin 15.4; Platelets 257; Potassium 3.8; Sodium 138   Lipid Panel    Component Value Date/Time   CHOL 168 02/12/2015 1257   TRIG 91 02/12/2015 1257   HDL 42 02/12/2015 1257   CHOLHDL 4.0 02/12/2015 1257   VLDL 18 02/12/2015 1257   LDLCALC 108 02/12/2015 1257    Additional studies/ records that were reviewed today include:  none    ASSESSMENT:    1. CAD with NSTEMI 10/2013 s/p PCI of Ramus Intermedius - Promus Premier DES 2.5 mm  20 mm (2.75 mm)    2. Hyperlipidemia LDL goal <70   3. Essential  hypertension      PLAN:  In order of problems listed above:  1. ASCAD  s/p NSTEMI 10/2013 s/p PCI with DES to RI with.repeat cath for CP showing ramus-2 lesion 90% stenosed just distal to prior stent s/p drug-eluting stent.He has not had any further angina. Continue ASA/statin.  He just finished the Twilight study yesterday.  He will continue on ASA 81mg  daily and start Plavix 75mg  daily.   2. Hyperlipidemia with LDL goal < 70.  Continue Praulent/atorva  and zetia.  Check FLP and ALT. 3. HTN - BP borderline controlled on current meds. Continue ARB/diuretic.  I will increase Cozaar to 100mg  daily and followup in HTN clinic in 1 week.    Medication Adjustments/Labs and Tests Ordered: Current medicines are reviewed at length with the patient today.  Concerns regarding medicines are outlined above.  Medication changes, Labs and Tests ordered today are listed in the Patient Instructions below.  There are no Patient Instructions on file for this visit.   Signed, Armanda Magic, MD  10/26/2015 3:02 PM    [facility] [address] [place], [place], [place]  [address] Phone: [phone]; Fax: [phone]

## 2015-10-26 ENCOUNTER — Ambulatory Visit (INDEPENDENT_AMBULATORY_CARE_PROVIDER_SITE_OTHER): Payer: No Typology Code available for payment source | Admitting: Cardiology

## 2015-10-26 ENCOUNTER — Encounter: Payer: Self-pay | Admitting: Cardiology

## 2015-10-26 ENCOUNTER — Encounter: Payer: Self-pay | Admitting: *Deleted

## 2015-10-26 VITALS — BP 150/94 | HR 97 | Ht 69.0 in | Wt 169.1 lb

## 2015-10-26 DIAGNOSIS — I2510 Atherosclerotic heart disease of native coronary artery without angina pectoris: Secondary | ICD-10-CM | POA: Diagnosis not present

## 2015-10-26 DIAGNOSIS — Z9861 Coronary angioplasty status: Secondary | ICD-10-CM | POA: Diagnosis not present

## 2015-10-26 DIAGNOSIS — I10 Essential (primary) hypertension: Secondary | ICD-10-CM

## 2015-10-26 DIAGNOSIS — E785 Hyperlipidemia, unspecified: Secondary | ICD-10-CM | POA: Diagnosis not present

## 2015-10-26 DIAGNOSIS — Z006 Encounter for examination for normal comparison and control in clinical research program: Secondary | ICD-10-CM

## 2015-10-26 MED ORDER — ASPIRIN EC 81 MG PO TBEC: 81.0000 mg | DELAYED_RELEASE_TABLET | Freq: Every day | ORAL | Status: AC

## 2015-10-26 MED ORDER — LOSARTAN POTASSIUM 100 MG PO TABS: 100.0000 mg | ORAL_TABLET | Freq: Every day | ORAL | 3 refills | Status: DC

## 2015-10-26 MED ORDER — CLOPIDOGREL BISULFATE 75 MG PO TABS: 75.0000 mg | ORAL_TABLET | Freq: Every day | ORAL | 3 refills | Status: AC

## 2015-10-26 NOTE — Patient Instructions (Signed)
Medication Instructions:  1. START PLAVIX 75 MG DAILY  2. INCREASE COZAAR 100 MG DAILY; NEW RX SENT   3. STOP BRILINTA  4. START ASPIRIN 81 MG DAILY  Labwork: 1 WEEK FOR BMET, FASTING LIPID AND LIVER PANEL;  Testing/Procedures: NONE  Follow-Up: 1. Your physician wants you to follow-up in: 6 MONTHS WITH DR. Sherlyn LickURNER You will receive a reminder letter in the mail two months in advance. If you don't receive a letter, please call our office to schedule the follow-up appointment.  2. YOU ARE BEING REFERRED TO OUR HTN CLINIC ; THIS APPT NEEDS TO BE IN 1 WEEK Any Other Special Instructions Will Be Listed Below (If Applicable).     If you need a refill on your cardiac medications before your next appointment, please call your pharmacy.

## 2015-10-26 NOTE — Progress Notes (Signed)
TWILIGHT Research study 15 month End of treatment visit completed. Patient has been 100 % compliant with ASA/Placebo and 81.9 % compliant with Brilinta (after pill count). He denies any bleeding or other adverse events. Today Dr. Mayford Knifeurner has started him on Plavix 75 mg and ASA 81 mg. Mr. Joseph Carr will start this tomorrow (he cannot get to drug store today. I provided him with 2 brilinta from his bottle that he returned today (enough for pm dose tonight and am dose in morning). Patient was told to make sure he started Plavix tomorrow. Questions encouraged and answered. I asked the assistant to please let patient know if he was to take loading dose of Plavix once she conversed with Dr. Mayford Knifeurner. Next research required visit will be month 18 telephone contact.

## 2015-11-04 NOTE — Progress Notes (Deleted)
Patient ID: Joseph RolesJames M Merriweather                 DOB: [birth year]                      MRN: [id number]     HPI: Joseph Carr is a 45 y.o. male patient of Dr. Mayford Knifeurner with PMH below who presents today for hypertension evaluation. He was recently seen by Dr. Mayford Knifeurner at which time his losartan was increased to 100mg  daily.    Cardiac Hx: HTN, CAD, hx of NSTEMI, HLD, tobacco abuse  Current HTN meds:  Losartan 100mg  daily Hydrochlorothiazide 25mg  daily  Previously tried:   BP goal: <140/90  Family History:   Social History:   Diet:   Exercise:   Home BP readings:   Wt Readings from Last 3 Encounters:  10/26/15 169 lb 1.9 oz (76.7 kg)  06/15/15 173 lb 1.9 oz (78.5 kg)  04/21/15 177 lb 12.8 oz (80.6 kg)   BP Readings from Last 3 Encounters:  10/26/15 (!) 150/94  06/15/15 140/88  05/10/15 141/87   Pulse Readings from Last 3 Encounters:  10/26/15 97  06/15/15 96  05/10/15 75    Renal function: CrCl cannot be calculated (Patient's most recent lab result is older than the maximum 21 days allowed.).  Past Medical History:  Diagnosis Date  . CAD (coronary artery disease)    a. s/p STEMI 10/2013 - DES to ramus intermedius. Patent LM, LAD, RCA, EF 55%;  b. 06/2014 Cath/PCI: LM nl, LAD 15p, RI patent stent prox, 3858m (2.5x12 Promus Premier DES), LCX 40p, EF 55-65%.  Marland Kitchen. Hx of cardiovascular stress test    ETT/Lexiscan Myoview (11/15):  Inferior/inferolateral fixed defect c/w soft tissue attenuation and / or subendocardial scar No significant ischemia. Overall low to intermediate risk scan.LV Ejection Fraction: 51%.  . Hyperlipidemia LDL goal <70   . Hypertension   . Low TSH level   . Tobacco abuse     Current Outpatient Prescriptions on File Prior to Visit  Medication Sig Dispense Refill  . Alirocumab (PRALUENT) 75 MG/ML SOPN Inject 1 pen into the skin every 14 (fourteen) days. 2 pen 11  . aspirin EC 81 MG tablet Take 1 tablet (81 mg total) by mouth daily.    Marland Kitchen. atorvastatin  (LIPITOR) 80 MG tablet TAKE ONE TABLET BY MOUTH ONCE DAILY AT  6PM 30 tablet 5  . clopidogrel (PLAVIX) 75 MG tablet Take 1 tablet (75 mg total) by mouth daily. 90 tablet 3  . hydrochlorothiazide (HYDRODIURIL) 25 MG tablet Take 1 tablet (25 mg total) by mouth daily. 90 tablet 3  . losartan (COZAAR) 100 MG tablet Take 1 tablet (100 mg total) by mouth daily. 90 tablet 3  . nitroGLYCERIN (NITROSTAT) 0.4 MG SL tablet DISSOLVE ONE TABLET UNDER THE TONGUE EVERY 5 MINUTES AS NEEDED FOR CHEST PAIN.  DO NOT EXCEED A TOTAL OF 3 DOSES IN 15 MINUTES 25 tablet 5   No current facility-administered medications on file prior to visit.     Allergies  Allergen Reactions  . Ace Inhibitors Swelling    Lip swelling that resolved with benadryl, did not compromise his breathing    There were no vitals taken for this visit.   Assessment/Plan: Hypertension: BP today   Thank you, Freddie ApleyKelley M. Cleatis PolkaAuten, PharmD  [facility]  11/04/2015 9:40 PM

## 2015-11-05 ENCOUNTER — Ambulatory Visit: Payer: No Typology Code available for payment source

## 2015-11-05 ENCOUNTER — Other Ambulatory Visit: Payer: No Typology Code available for payment source

## 2015-11-16 ENCOUNTER — Other Ambulatory Visit: Payer: Self-pay | Admitting: Cardiology

## 2015-12-18 ENCOUNTER — Other Ambulatory Visit: Payer: Self-pay | Admitting: Cardiology

## 2016-02-25 ENCOUNTER — Other Ambulatory Visit: Payer: Self-pay | Admitting: Pharmacist

## 2016-02-25 MED ORDER — ALIROCUMAB 75 MG/ML ~~LOC~~ SOPN: 1.0000 "pen " | PEN_INJECTOR | SUBCUTANEOUS | 11 refills | Status: DC

## 2016-03-14 ENCOUNTER — Other Ambulatory Visit: Payer: Self-pay | Admitting: Pharmacist

## 2016-03-14 ENCOUNTER — Telehealth: Payer: Self-pay | Admitting: Pharmacist

## 2016-03-14 DIAGNOSIS — E785 Hyperlipidemia, unspecified: Secondary | ICD-10-CM

## 2016-03-14 MED ORDER — EZETIMIBE 10 MG PO TABS: 10.0000 mg | ORAL_TABLET | Freq: Every day | ORAL | 11 refills | Status: DC

## 2016-03-14 NOTE — Telephone Encounter (Signed)
LMOM for pt twice. Received fax that pt needs new prior authorization for Praluent. Pt never showed for lab work on [employer]uent and I am unable to reauthorize coverage without a recent lipid panel. Pt also stopped taking Zetia for unknown reason. Will await pt's return call.

## 2016-03-17 ENCOUNTER — Other Ambulatory Visit: Payer: No Typology Code available for payment source | Admitting: *Deleted

## 2016-03-17 DIAGNOSIS — E785 Hyperlipidemia, unspecified: Secondary | ICD-10-CM

## 2016-03-18 LAB — LIPID PANEL
CHOL/HDL RATIO: 2 ratio (ref 0.0–5.0)
Cholesterol, Total: 152 mg/dL (ref 100–199)
HDL: 75 mg/dL (ref >39)
LDL Calculated: 65 mg/dL (ref 0–99)
Triglycerides: 58 mg/dL (ref 0–149)
VLDL CHOLESTEROL CAL: 12 mg/dL (ref 5–40)

## 2016-03-20 ENCOUNTER — Encounter: Payer: Self-pay | Admitting: Cardiology

## 2016-03-20 NOTE — Telephone Encounter (Signed)
Follow Up:; ° ° °Returning your call. °

## 2016-03-20 NOTE — Telephone Encounter (Signed)
This encounter was created in error - please disregard.

## 2016-04-10 ENCOUNTER — Telehealth: Payer: Self-pay | Admitting: Cardiology

## 2016-04-10 DIAGNOSIS — E785 Hyperlipidemia, unspecified: Secondary | ICD-10-CM

## 2016-04-10 NOTE — Telephone Encounter (Signed)
°   New message    pt verbalized that he is calling to speak to rn    To get the information on an injection medication that he has been without for months  Please call and advise

## 2016-04-10 NOTE — Telephone Encounter (Signed)
Awilda MetroMegan E Supple, RPH   03/14/16 3:04 PM  Note    LMOM for pt twice. Received fax that pt needs new prior authorization for Praluent. Pt never showed for lab work on [employer]raluent and I am unable to reauthorize coverage without a recent lipid panel. Pt also stopped taking Zetia for unknown reason. Will await pt's return call.       Informed patient that RPH has tried to call him regarding Praluent. Confirmed with patient he is taking Zetia 10 mg daily. FLP scheduled this Friday. He understands he needs labs drawn in order to reauthorize coverage for the medication. Patient agrees with treatment plan.

## 2016-04-11 ENCOUNTER — Telehealth: Payer: Self-pay | Admitting: Pharmacist

## 2016-04-11 NOTE — Telephone Encounter (Signed)
Called [employer] prior auth line to follow up on appeals letter submitted 21 days ago. The denial letter stated we would have a decision within 15 days and we have not heard anything. The prior auth rep informed me that she could not access any information on the status of the appeals, she could only see if it was approved or denied. Asked her if there was anyone I could call in the appeals department since we were supposed to receive a decision over a week ago and she stated that there is no contact number for the appeals dept. Then asked her how we are supposed to tell if the appeals is even being reviewed since we can't contact them, and she again stated that she did not know. She advised that we send a fax to the appeals dept. Clearly this is a poor method of communication when we need to speak to someone. Will fax in this request since we are a week overdue on appeals decision and there is apparently no way to contact the appeals department directly. Patient has has his Praluent therapy interrupted because of this delay.

## 2016-04-11 NOTE — Telephone Encounter (Signed)
Pt did end up coming in 2/9 for fasting lipids. A new prior auth for Praluent was submitted and denied. Appeals letter was sent on 03/20/16 and am still awaiting decision from insurance company. Pt won't need labs this week - attempted to call pt but his line was busy. Will attempt to reach him again later today.

## 2016-04-11 NOTE — Telephone Encounter (Signed)
Patient's phone has had a busy signal for the past 4 hours hours. Still unable to reach him.

## 2016-04-12 NOTE — Telephone Encounter (Signed)
LMOM for pt to return call to cancel lab work for Friday.

## 2016-04-13 NOTE — Telephone Encounter (Signed)
LMOM again for pt. 

## 2016-04-14 ENCOUNTER — Other Ambulatory Visit: Payer: No Typology Code available for payment source

## 2016-04-28 ENCOUNTER — Telehealth: Payer: Self-pay | Admitting: Pharmacist

## 2016-04-28 MED ORDER — ALIROCUMAB 75 MG/ML ~~LOC~~ SOPN: 1.0000 "pen " | PEN_INJECTOR | SUBCUTANEOUS | 11 refills | Status: DC

## 2016-04-28 NOTE — Telephone Encounter (Signed)
Received authorization for patient's Praluent from [employer]. Left message for patient to let him know.

## 2016-04-28 NOTE — Telephone Encounter (Signed)
Spoke to patient and informed that Praluent was approved. [facility], and instructed patient to give us a call when he receives the prescription.

## 2016-05-23 ENCOUNTER — Telehealth: Payer: Self-pay | Admitting: Cardiology

## 2016-05-23 NOTE — Telephone Encounter (Signed)
Joseph Carr is calling to let you know that he has not gotten the injection pen for his cholesterol and have not heard anything from anybody . The last thing he has heard was that it was approved by his insurance and that was last month , Please call on his cell phone [phone]..   Thanks

## 2016-05-24 NOTE — Telephone Encounter (Signed)
LMOM for pt to return call. Will provide him with [employer] number so he can reach out to them to coordinate shipment of Praluent.  Called [employer] to clarify what the delay was. They stated they had called pt on March 27th and April 13th. When I asked which number they called the pt at, they gave me a number we do not have listed. Provided them with pt's current phone number and they stated they will contact pt again.

## 2016-05-25 NOTE — Telephone Encounter (Signed)
Spoke with pt and he will call pharmacy to set up shipment. He is aware to call when medication arrives.

## 2016-06-13 ENCOUNTER — Telehealth: Payer: Self-pay | Admitting: Cardiology

## 2016-06-13 NOTE — Telephone Encounter (Signed)
Informed patient he may take Tylenol for occasional headache.  He understands he is only to use as directed and short term to reduce potential liver damage. He was grateful for call.

## 2016-06-13 NOTE — Telephone Encounter (Signed)
New message    Pt is calling to find out what kind of medication he can take for his headache because he is on blood thinner.

## 2016-06-26 ENCOUNTER — Other Ambulatory Visit: Payer: Self-pay | Admitting: Cardiology

## 2016-06-26 ENCOUNTER — Telehealth: Payer: Self-pay | Admitting: Cardiology

## 2016-06-26 NOTE — Telephone Encounter (Signed)
Walk In pt Form-Clearance Form dropped off. Placed in [facility] doc box.

## 2016-06-29 ENCOUNTER — Telehealth: Payer: Self-pay

## 2016-06-29 NOTE — Telephone Encounter (Signed)
Forwarded to [facility].

## 2016-06-29 NOTE — Telephone Encounter (Signed)
Received clearance request from [facility] (Dr. Alberteen SpindleJustin McKinlay) for simple extraction of tooth #20. He would like cardiac risk (low, moderate, high). He would like to know if antibiotic prophylaxis is needed. He would like to know if there is any contraindication for the use of epinephrine.  Patient is on Brilinta - he would like to know if therapy should be held prior to procedure and how long.   Clearance to be faxed to: [facility]  Fax: [phone] Phone: [phone]

## 2016-06-29 NOTE — Telephone Encounter (Signed)
He was changed to Plavix and ok to hold for tooth extraction if needed

## 2016-06-29 NOTE — Telephone Encounter (Signed)
No antibiotic prophylaxis needed.  He is low risk for tooth extraction from cardiac standpoint.  Would avoid epinephrine

## 2016-11-15 ENCOUNTER — Other Ambulatory Visit: Payer: Self-pay | Admitting: Cardiology

## 2016-11-15 DIAGNOSIS — I10 Essential (primary) hypertension: Secondary | ICD-10-CM

## 2016-11-15 DIAGNOSIS — I2510 Atherosclerotic heart disease of native coronary artery without angina pectoris: Secondary | ICD-10-CM

## 2016-11-15 DIAGNOSIS — Z9861 Coronary angioplasty status: Principal | ICD-10-CM

## 2017-02-12 ENCOUNTER — Ambulatory Visit: Payer: No Typology Code available for payment source | Admitting: Cardiology

## 2017-02-12 ENCOUNTER — Encounter: Payer: Self-pay | Admitting: Cardiology

## 2017-02-12 ENCOUNTER — Encounter (INDEPENDENT_AMBULATORY_CARE_PROVIDER_SITE_OTHER): Payer: Self-pay

## 2017-02-12 VITALS — BP 180/110 | HR 95 | Resp 16 | Ht 69.0 in | Wt 156.4 lb

## 2017-02-12 DIAGNOSIS — I2510 Atherosclerotic heart disease of native coronary artery without angina pectoris: Secondary | ICD-10-CM | POA: Diagnosis not present

## 2017-02-12 DIAGNOSIS — E785 Hyperlipidemia, unspecified: Secondary | ICD-10-CM | POA: Diagnosis not present

## 2017-02-12 DIAGNOSIS — I10 Essential (primary) hypertension: Secondary | ICD-10-CM | POA: Diagnosis not present

## 2017-02-12 MED ORDER — HYDROCHLOROTHIAZIDE 25 MG PO TABS: 25.0000 mg | ORAL_TABLET | Freq: Every day | ORAL | 5 refills | Status: DC

## 2017-02-12 MED ORDER — EZETIMIBE 10 MG PO TABS: 10.0000 mg | ORAL_TABLET | Freq: Every day | ORAL | 5 refills | Status: DC

## 2017-02-12 MED ORDER — ATORVASTATIN CALCIUM 80 MG PO TABS: ORAL_TABLET | ORAL | 5 refills | Status: DC

## 2017-02-12 MED ORDER — LOSARTAN POTASSIUM 100 MG PO TABS: 100.0000 mg | ORAL_TABLET | Freq: Every day | ORAL | 5 refills | Status: DC

## 2017-02-12 NOTE — Patient Instructions (Addendum)
Medication Instructions:  Your physician recommends that you continue on your current medications as directed. Please refer to the Current Medication list given to you today.  Refills have been sent in for Lipitor, Zetia, HCTZ, and Losartan  Labwork: FASTING Lipids, CMET: SAME DAY AS YOU COME BACK TO SEE THE HYPERTENSION CLINIC  Nothing to eat or drink after midnight the night before your labwork.   Testing/Procedures: NONE ORDERED  Follow-Up: Your physician recommends that you schedule a follow-up appointment with the Hypertension Clinic in 1-2 weeks to recheck your blood pressure. Remember to have your labs drawn the same day you come for this appointment.   Your physician wants you to follow-up in 6 months with Dr. Mayford Knifeurner. You will receive a reminder letter in the mail two months in advance. If you don't receive a letter, please call our office to schedule the follow-up appointment.   Any Other Special Instructions Will Be Listed Below (If Applicable).     If you need a refill on your cardiac medications before your next appointment, please call your pharmacy.

## 2017-02-12 NOTE — Progress Notes (Signed)
02/12/2017 Joseph Carr   05/08/1970  [id number]  Primary Physician Shamleffer, Landry Mellow, MD Primary Cardiologist: Dr. Mayford Knife   Reason for Visit/CC: Routine F/u for CAD  HPI:  Joseph Carr is a 47 y.o. male, followed by Dr. Mayford Knife, with a history of CAD s/p NSTEMI 10/2013 treated with PCI + DES to RI. Repeat cath for CP showing ramus-2 lesion 90% stenosed just distal to prior stent and underwent drug-eluting stent. He also has HTN, tobacco abuse and  HL. His last OV with Dr. Mayford Knife was 10/26/15. He was doing well at that time. Brilinta was changed to Plavix (completed Twilight study). He was continued on ASA.   He presents back to clinic today. He was lost to f/u. As a result, he has been out of refills. Has been out of all meds x 2 1/2 months. BP is elevated in clinic at 180/110. He is asymptomatic denying HA, dizziness, CP, dyspnea, LEE, PND and orthopnea. No exertional symptoms. Denies tobacco use.   EKG shows NSR with LVH. 95 bpm.   Current Meds  Medication Sig  . Alirocumab (PRALUENT) 75 MG/ML SOPN Inject 1 pen into the skin every 14 (fourteen) days.  Marland Kitchen aspirin EC 81 MG tablet Take 1 tablet (81 mg total) by mouth daily.  . nitroGLYCERIN (NITROSTAT) 0.4 MG SL tablet DISSOLVE ONE TABLET UNDER THE TONGUE EVERY 5 MINUTES AS NEEDED FOR CHEST PAIN.  DO NOT EXCEED A TOTAL OF 3 DOSES IN 15 MINUTES   Allergies  Allergen Reactions  . Ace Inhibitors Swelling    Lip swelling that resolved with benadryl, did not compromise his breathing   Past Medical History:  Diagnosis Date  . CAD (coronary artery disease)    a. s/p STEMI 10/2013 - DES to ramus intermedius. Patent LM, LAD, RCA, EF 55%;  b. 06/2014 Cath/PCI: LM nl, LAD 15p, RI patent stent prox, 30m (2.5x12 Promus Premier DES), LCX 40p, EF 55-65%.  Marland Kitchen Hx of cardiovascular stress test    ETT/Lexiscan Myoview (11/15):  Inferior/inferolateral fixed defect c/w soft tissue attenuation and / or subendocardial scar No significant  ischemia. Overall low to intermediate risk scan.LV Ejection Fraction: 51%.  . Hyperlipidemia LDL goal <70   . Hypertension   . Low TSH level   . Tobacco abuse    Family History  Problem Relation Age of Onset  . Heart attack Father   . Hypertension Father   . Stroke Father    Past Surgical History:  Procedure Laterality Date  . CARDIAC CATHETERIZATION N/A 07/03/2014   Procedure: Left Heart Cath and Coronary Angiography;  Surgeon: Peter M Swaziland, MD;  Location: [facility] INVASIVE CV LAB;  Service: Cardiovascular;  Laterality: N/A;  . CARDIAC CATHETERIZATION N/A 07/03/2014   Procedure: Coronary Stent Intervention;  Surgeon: Peter M Swaziland, MD;  Location: [facility] INVASIVE CV LAB;  Service: Cardiovascular;  Laterality: N/A;  . CORONARY ANGIOPLASTY    . FINGER FRACTURE SURGERY Left 2014   4th finger  . LEFT HEART CATHETERIZATION WITH CORONARY ANGIOGRAM N/A 10/20/2013   Procedure: LEFT HEART CATHETERIZATION WITH CORONARY ANGIOGRAM;  Surgeon: Corky Crafts, MD;  Location: [facility] CATH LAB;  Service: Cardiovascular;  Laterality: N/A;  . PERCUTANEOUS CORONARY STENT INTERVENTION (PCI-S)  07/03/2014   RAMUS     Social History   Socioeconomic History  . Marital status: Married    Spouse name: Not on file  . Number of children: Not on file  . Years of education: Not on file  . Highest education  level: Not on file  Social Needs  . Financial resource strain: Not on file  . Food insecurity - worry: Not on file  . Food insecurity - inability: Not on file  . Transportation needs - medical: Not on file  . Transportation needs - non-medical: Not on file  Occupational History  . Occupation: Works in IT trainerlumber  Tobacco Use  . Smoking status: Former Smoker    Packs/day: 0.75    Years: 30.00    Pack years: 22.50    Types: Cigarettes    Last attempt to quit: 07/03/2014    Years since quitting: 2.6  . Smokeless tobacco: Never Used  Substance and Sexual Activity  . Alcohol use: Yes  . Drug use: No  . Sexual  activity: Not on file  Other Topics Concern  . Not on file  Social History Narrative   Lives with family     Review of Systems: General: negative for chills, fever, night sweats or weight changes.  Cardiovascular: negative for chest pain, dyspnea on exertion, edema, orthopnea, palpitations, paroxysmal nocturnal dyspnea or shortness of breath Dermatological: negative for rash Respiratory: negative for cough or wheezing Urologic: negative for hematuria Abdominal: negative for nausea, vomiting, diarrhea, bright red blood per rectum, melena, or hematemesis Neurologic: negative for visual changes, syncope, or dizziness All other systems reviewed and are otherwise negative except as noted above.   Physical Exam:  Blood pressure (!) 180/110, pulse (!) 102, resp. rate 16, height 5\' 4"  (1.626 m), weight 156 lb 6.4 oz (70.9 kg), SpO2 99 %.  General appearance: alert, cooperative and no distress Neck: no carotid bruit and no JVD Lungs: clear to auscultation bilaterally Heart: regular rate and rhythm, S1, S2 normal, no murmur, click, rub or gallop Extremities: extremities normal, atraumatic, no cyanosis or edema Pulses: 2+ and symmetric Skin: Skin color, texture, turgor normal. No rashes or lesions Neurologic: Grossly normal  EKG NSR with LVH, 95 bpm -- personally reviewed   ASSESSMENT AND PLAN:   1. CAD: h/o NSTEMI in 2015 treated with PCI + DES to the RI. He denies anginal symptoms. No exertional CP and dyspnea. Continue ASA and statin.   2. HTN: elevated in the setting of med noncompliance. He has been out of meds x 2 months given failure to f/u for routine visits for med refills. BP is 180/110 in clinic today. He is asymptomatic. We will restart HCTZ and Losartan. He will f/u in HTN clinic in 1-2 weeks for repeat BP check and further med titration if not at goal BP. We will also check CMP at that visit.  3. HLD: has been out of Lipitor x 2 months. Restart previous dose and check FLP and  HFTs at BP f/u visit. Goal LDL in the setting of CAD is < 70 mg/dL.    Follow-Up in 6 months with Dr. Mayford Knifeurner.   Delmer IslamSimmons PA-C, [facility] 02/12/2017 3:15 PM

## 2017-02-14 ENCOUNTER — Telehealth: Payer: Self-pay | Admitting: Cardiology

## 2017-02-14 NOTE — Telephone Encounter (Signed)
New message   *STAT* If patient is at the pharmacy, call can be transferred to refill team.   1. Which medications need to be refilled? (please list name of each medication and dose if known) hydrochlorothiazide (HYDRODIURIL) 25 MG tablet  2. Which pharmacy/location (including street and city if local pharmacy) is medication to be sent to?  [facility] [address] - [place] ([address]), [place] - [address] [phone] (Phone     3. Do they need a 30 day or 90 day supply?    Patient says that he was seen by simmons and had all his prescriptions called in but they didn't get the one mentioned above Please call

## 2017-02-14 NOTE — Telephone Encounter (Signed)
Called pt and spoke with pt's wife Victorino DikeJennifer, informing her that pt has picked this medication up already and has refills at his pharmacy. Wife stated that pt needed a medication but did not know the name. Wife stated that she will find out the name of the medication and call back. I advise the wife that if the pt has any other problems, questions or concerns to call the office. Wife verbalized understanding.

## 2017-02-26 ENCOUNTER — Other Ambulatory Visit: Payer: No Typology Code available for payment source | Admitting: *Deleted

## 2017-02-26 ENCOUNTER — Other Ambulatory Visit: Payer: Self-pay | Admitting: Cardiology

## 2017-02-26 DIAGNOSIS — I10 Essential (primary) hypertension: Secondary | ICD-10-CM

## 2017-02-26 DIAGNOSIS — E785 Hyperlipidemia, unspecified: Secondary | ICD-10-CM

## 2017-02-27 ENCOUNTER — Other Ambulatory Visit: Payer: No Typology Code available for payment source

## 2017-02-27 ENCOUNTER — Ambulatory Visit: Payer: No Typology Code available for payment source

## 2017-02-27 LAB — COMPREHENSIVE METABOLIC PANEL
ALK PHOS: 62 IU/L (ref 39–117)
ALT: 40 IU/L (ref 0–44)
AST: 57 IU/L — AB (ref 0–40)
Albumin/Globulin Ratio: 1.6 (ref 1.2–2.2)
Albumin: 4.6 g/dL (ref 3.5–5.5)
BILIRUBIN TOTAL: 0.5 mg/dL (ref 0.0–1.2)
BUN/Creatinine Ratio: 11 (ref 9–20)
BUN: 10 mg/dL (ref 6–24)
CHLORIDE: 98 mmol/L (ref 96–106)
CO2: 23 mmol/L (ref 20–29)
Calcium: 9.8 mg/dL (ref 8.7–10.2)
Creatinine, Ser: 0.95 mg/dL (ref 0.76–1.27)
GFR calc non Af Amer: 95 mL/min/{1.73_m2} (ref >59)
GFR, EST AFRICAN AMERICAN: 110 mL/min/{1.73_m2} (ref >59)
GLUCOSE: 83 mg/dL (ref 65–99)
Globulin, Total: 2.9 g/dL (ref 1.5–4.5)
Potassium: 3.9 mmol/L (ref 3.5–5.2)
Sodium: 141 mmol/L (ref 134–144)
TOTAL PROTEIN: 7.5 g/dL (ref 6.0–8.5)

## 2017-02-27 LAB — LIPID PANEL
CHOLESTEROL TOTAL: 161 mg/dL (ref 100–199)
Chol/HDL Ratio: 2.1 ratio (ref 0.0–5.0)
HDL: 75 mg/dL (ref >39)
LDL Calculated: 70 mg/dL (ref 0–99)
TRIGLYCERIDES: 82 mg/dL (ref 0–149)
VLDL CHOLESTEROL CAL: 16 mg/dL (ref 5–40)

## 2017-03-07 ENCOUNTER — Encounter: Payer: Self-pay | Admitting: *Deleted

## 2017-03-15 NOTE — Progress Notes (Signed)
Patient ID: Joseph Carr                 DOB: [birth year]                      MRN: [id number]     HPI: Joseph Carr is a 47 y.o. male patient of Dr Mayford Knife referred by Robbie Lis, PA to HTN clinic. PMH is significant for CAD s/p NSTEMI in 2015 treated with PCI and DES to RI. Repeat cath for CP showed ramus-2 lesion 90% stenosed just distal to prior stent and he underwent DES. He also has a history of HTN, tobacco abuse, and HLD. He was lost to follow up and ran out of medications for over 2 months. As a result, BP was elevated at 180/110. His losartan and HCTZ were resumed and f/u BMET was stable. He presents today for follow up.  Pt has previously been seen by me in lipid clinic for Praluent initiation. He presents today in good spirits and reports tolerating his medications well. He has been a bit stressed recently. He is taking his medications at 6AM. He has not checked his blood pressure since restarting his medication. He uses Mrs Sharilyn Sites to flavor his food with and he stays active.   Current HTN meds: HCTZ 25mg  daily, losartan 100mg  daily BP goal: <130/74mmHg  Family History: HTN, MI, and stroke in his father.  Social History: Former smoker 3/4 PPD for 30 years. Quit in 2016. Drinks alcohol, denies illicit drug use.  Diet: 1 cup of coffee in the morning. 1 [employer] a day, water the rest of the time. Eats sandwiches, baked chicken, and grits. Uses Mrs Sharilyn Sites.   Exercise: Walks frequently.  Home BP readings: Has not been checking  Wt Readings from Last 3 Encounters:  02/12/17 156 lb 6.4 oz (70.9 kg)  10/26/15 169 lb 1.9 oz (76.7 kg)  06/15/15 173 lb 1.9 oz (78.5 kg)   BP Readings from Last 3 Encounters:  02/12/17 (!) 180/110  10/26/15 (!) 150/94  06/15/15 140/88   Pulse Readings from Last 3 Encounters:  02/12/17 95  10/26/15 97  06/15/15 96    Renal function: CrCl cannot be calculated (Unknown ideal weight.).  Past Medical History:  Diagnosis Date  . CAD  (coronary artery disease)    a. s/p STEMI 10/2013 - DES to ramus intermedius. Patent LM, LAD, RCA, EF 55%;  b. 06/2014 Cath/PCI: LM nl, LAD 15p, RI patent stent prox, 78m (2.5x12 Promus Premier DES), LCX 40p, EF 55-65%.  Marland Kitchen Hx of cardiovascular stress test    ETT/Lexiscan Myoview (11/15):  Inferior/inferolateral fixed defect c/w soft tissue attenuation and / or subendocardial scar No significant ischemia. Overall low to intermediate risk scan.LV Ejection Fraction: 51%.  . Hyperlipidemia LDL goal <70   . Hypertension   . Low TSH level   . Tobacco abuse     Current Outpatient Medications on File Prior to Visit  Medication Sig Dispense Refill  . Alirocumab (PRALUENT) 75 MG/ML SOPN Inject 1 pen into the skin every 14 (fourteen) days. 2 pen 11  . aspirin EC 81 MG tablet Take 1 tablet (81 mg total) by mouth daily.    Marland Kitchen atorvastatin (LIPITOR) 80 MG tablet TAKE ONE TABLET BY MOUTH ONCE DAILY AT  6PM 30 tablet 5  . ezetimibe (ZETIA) 10 MG tablet Take 1 tablet (10 mg total) by mouth daily. 30 tablet 5  . hydrochlorothiazide (HYDRODIURIL) 25 MG tablet Take 1 tablet (  25 mg total) by mouth daily. 30 tablet 5  . losartan (COZAAR) 100 MG tablet Take 1 tablet (100 mg total) by mouth daily. 30 tablet 5  . nitroGLYCERIN (NITROSTAT) 0.4 MG SL tablet DISSOLVE ONE TABLET UNDER THE TONGUE EVERY 5 MINUTES AS NEEDED FOR CHEST PAIN.  DO NOT EXCEED A TOTAL OF 3 DOSES IN 15 MINUTES 75 tablet 0   No current facility-administered medications on file prior to visit.     Allergies  Allergen Reactions  . Ace Inhibitors Swelling    Lip swelling that resolved with benadryl, did not compromise his breathing     Assessment/Plan:  1. Hypertension - BP improved however remains above goal <130/7080mmHg. Will start carvedilol 6.25mg  BID for BP control due to ACS history and will continue losartan 100mg  daily and HCTZ 25mg  daily. F/u in HTN clinic in 3 weeks. Advised pt to bring his home medications to f/u visit because he is  unsure of every medication he is taking currently.    E. , PharmD, CPP, BCACP [facility] [address], [place], [place] [address] Phone: [phone](336) [phone]; Fax: [phone](336) [phone] 03/16/2017 11:14 AM

## 2017-03-16 ENCOUNTER — Encounter (INDEPENDENT_AMBULATORY_CARE_PROVIDER_SITE_OTHER): Payer: Self-pay

## 2017-03-16 ENCOUNTER — Ambulatory Visit (INDEPENDENT_AMBULATORY_CARE_PROVIDER_SITE_OTHER): Payer: No Typology Code available for payment source | Admitting: Pharmacist

## 2017-03-16 VITALS — BP 154/92 | HR 95

## 2017-03-16 DIAGNOSIS — I10 Essential (primary) hypertension: Secondary | ICD-10-CM

## 2017-03-16 MED ORDER — CARVEDILOL 6.25 MG PO TABS: 6.2500 mg | ORAL_TABLET | Freq: Two times a day (BID) | ORAL | 11 refills | Status: DC

## 2017-03-16 NOTE — Patient Instructions (Signed)
It was nice to see you today  Your goal blood pressure is less than 130/2980mmHg  Start taking carvedilol 6.25mg  twice daily for your blood pressure  Continue taking your other medications  Follow up in clinic in 3 weeks for a blood pressure check. Bring in your medications with you to this visit

## 2017-04-06 ENCOUNTER — Ambulatory Visit (INDEPENDENT_AMBULATORY_CARE_PROVIDER_SITE_OTHER): Payer: No Typology Code available for payment source | Admitting: Pharmacist

## 2017-04-06 VITALS — BP 120/80 | HR 88

## 2017-04-06 DIAGNOSIS — I10 Essential (primary) hypertension: Secondary | ICD-10-CM | POA: Diagnosis not present

## 2017-04-06 NOTE — Progress Notes (Signed)
Patient ID: Joseph Carr                 DOB: 08/04/1970                      MRN: [id number]     HPI: Joseph Carr is a 47 y.o. male patient of Dr Mayford Knifeurner referred by Robbie LisBrittainy Simmons, PA to HTN clinic. PMH is significant for CAD s/p NSTEMI in 2015 treated with PCI and DES to RI. Repeat cath for CP showed ramus-2 lesion 90% stenosed just distal to prior stent and he underwent DES. He also has a history of HTN, tobacco abuse, and HLD. He was lost to follow up and ran out of medications for over 2 months. As a result, BP was elevated at 180/110. His losartan and HCTZ were resumed and f/u BMET was stable. At last visit, carvedilol 6.25mg  BID was started due to ACS history.  Patient presents in good spirits. He reports tolerating his medication well. He is slightly tired in the morning but this improves by noon and has gotten better the longer he has taken his carvedilol. He is occasionally lightheaded when he moves too quickly, however this remains unchanged with previous medication adjustments. He took his medication this morning. Does not check his BP at home. He ate a country ham biscuit with egg and 1 cup of coffee this morning. He uses Mrs Sharilyn [employer]Dash to flavor his food with and he stays active.   Current HTN meds: HCTZ 25mg  daily, losartan 100mg  daily, carvedilol 6.25mg  BID BP goal: <130/2980mmHg  Family History: HTN, MI, and stroke in his father.  Social History: Former smoker 3/4 PPD for 30 years. Quit in 2016. Drinks alcohol, denies illicit drug use.  Diet: 1 cup of coffee in the morning. 1 [employer] a day, water the rest of the time. Eats sandwiches, baked chicken, and grits. Uses Mrs Sharilyn [employer]ash.   Exercise: Walks frequently.  Home BP readings: Has not been checking  Wt Readings from Last 3 Encounters:  02/12/17 156 lb 6.4 oz (70.9 kg)  10/26/15 169 lb 1.9 oz (76.7 kg)  06/15/15 173 lb 1.9 oz (78.5 kg)   BP Readings from Last 3 Encounters:  03/16/17 (!) 154/92  02/12/17 (!) 180/110    10/26/15 (!) 150/94   Pulse Readings from Last 3 Encounters:  03/16/17 95  02/12/17 95  10/26/15 97    Renal function: CrCl cannot be calculated (Patient's most recent lab result is older than the maximum 21 days allowed.).  Past Medical History:  Diagnosis Date  . CAD (coronary artery disease)    a. s/p STEMI 10/2013 - DES to ramus intermedius. Patent LM, LAD, RCA, EF 55%;  b. 06/2014 Cath/PCI: LM nl, LAD 15p, RI patent stent prox, 119m (2.5x12 Promus Premier DES), LCX 40p, EF 55-65%.  Marland Kitchen. Hx of cardiovascular stress test    ETT/Lexiscan Myoview (11/15):  Inferior/inferolateral fixed defect c/w soft tissue attenuation and / or subendocardial scar No significant ischemia. Overall low to intermediate risk scan.LV Ejection Fraction: 51%.  . Hyperlipidemia LDL goal <70   . Hypertension   . Low TSH level   . Tobacco abuse     Current Outpatient Medications on File Prior to Visit  Medication Sig Dispense Refill  . Alirocumab (PRALUENT) 75 MG/ML SOPN Inject 1 pen into the skin every 14 (fourteen) days. 2 pen 11  . aspirin EC 81 MG tablet Take 1 tablet (81 mg total) by mouth daily.    .Marland Kitchen  atorvastatin (LIPITOR) 80 MG tablet TAKE ONE TABLET BY MOUTH ONCE DAILY AT  6PM 30 tablet 5  . carvedilol (COREG) 6.25 MG tablet Take 1 tablet (6.25 mg total) by mouth 2 (two) times daily. 60 tablet 11  . ezetimibe (ZETIA) 10 MG tablet Take 1 tablet (10 mg total) by mouth daily. 30 tablet 5  . hydrochlorothiazide (HYDRODIURIL) 25 MG tablet Take 1 tablet (25 mg total) by mouth daily. 30 tablet 5  . losartan (COZAAR) 100 MG tablet Take 1 tablet (100 mg total) by mouth daily. 30 tablet 5  . nitroGLYCERIN (NITROSTAT) 0.4 MG SL tablet DISSOLVE ONE TABLET UNDER THE TONGUE EVERY 5 MINUTES AS NEEDED FOR CHEST PAIN.  DO NOT EXCEED A TOTAL OF 3 DOSES IN 15 MINUTES 75 tablet 0   No current facility-administered medications on file prior to visit.     Allergies  Allergen Reactions  . Ace Inhibitors Swelling    Lip  swelling that resolved with benadryl, did not compromise his breathing     Assessment/Plan:  1. Hypertension - BP has improved drastically over the past few visits and is now at goal <130/7mmHg, confirmed in each arm. Will continue carvedilol 6.25mg  BID, losartan 100mg  daily and HCTZ 25mg  daily. F/u in HTN clinic as needed.   Megan E. Supple, PharmD, CPP, BCACP [facility] [address] [place], [place] [address] Phone: [phone]; Fax: [phone] 04/06/2017 7:19 AM

## 2017-04-06 NOTE — Patient Instructions (Signed)
It was nice to see you today  Your blood pressure today was excellent!!   Continue taking your current medications  Call  in the blood pressure clinic with any blood pressure concerns

## 2017-05-01 ENCOUNTER — Other Ambulatory Visit: Payer: Self-pay | Admitting: Pharmacist

## 2017-05-01 MED ORDER — ALIROCUMAB 75 MG/ML ~~LOC~~ SOPN: 1.0000 "pen " | PEN_INJECTOR | SUBCUTANEOUS | 11 refills | Status: DC

## 2017-06-06 ENCOUNTER — Telehealth: Payer: Self-pay | Admitting: Cardiology

## 2017-06-06 NOTE — Telephone Encounter (Signed)
New Message    Patient states that he has recently developed a cold. He indicates that when he coughs he feels a tightness in his chest. But only when he coughs. Please call to discuss.

## 2017-06-06 NOTE — Telephone Encounter (Signed)
I spoke with pt.  He reports cough and cold for the last week.  Chest tightness at times when coughing. States it comes and goes. No fever. Has been taking cough medication and thinks it may be coricidin.  I told him it was OK to take coricidin HBP for cough and cold.  I advised him not to take any medications with a decongestant in it. Also reports he has been having numbness in left hand off and on recently. Lasts about 25-30 minutes.  Happened over the weekend when he was cooking. Had to use other arm.  Reports he had numbness in lower lip this morning. This has subsided. He reports numbness in hand is like the symptoms he had when he had his heart attack.  States he also had lip numbness at that time.  He does have NTG and I told him he could try this if numbness happens again. I scheduled pt to see Tereso Newcomer, PA on 06/08/17 at 2:15,  I advised him to go to ED if symptoms worsen prior to this appointment

## 2017-06-07 NOTE — Telephone Encounter (Signed)
Patient's spouse called stating patient is having ongoing chest pain. Patient was scheduled with Tereso Newcomer, PA for tomorrow. Per schedulers he was bumped to a later date. I informed her to take patient to the ER to be evaluated due to ongoing chest pain. She stated she would take him today and thankful for the call.

## 2017-06-07 NOTE — Telephone Encounter (Signed)
Follow up  Pt c/o of Chest Pain: STAT if CP now or developed within 24 hours  1. Are you having CP right now? Unknown pt wife on the phone the pain started 24 hours ago   2. Are you experiencing any other symptoms (ex. SOB, nausea, vomiting, sweating)? unknwn  3. How long have you been experiencing CP? 06/06/2017 started at [facility]   4. Is your CP continuous or coming and going? Unknown   5. Have you taken Nitroglycerin? no ?

## 2017-06-08 ENCOUNTER — Ambulatory Visit: Payer: No Typology Code available for payment source | Admitting: Physician Assistant

## 2017-07-10 ENCOUNTER — Ambulatory Visit: Payer: No Typology Code available for payment source | Admitting: Physician Assistant

## 2017-07-10 DIAGNOSIS — R0989 Other specified symptoms and signs involving the circulatory and respiratory systems: Secondary | ICD-10-CM

## 2017-07-10 NOTE — Progress Notes (Deleted)
Cardiology Office Note:    Date:  07/10/2017   ID:  Joseph Carr, DOB 17-Nov-1970, MRN [id number]  PCP:  Tommy Rainwater, MD  Cardiologist:  No primary care provider on file.   Referring MD: Lonzo Cloud, Ibethal Jar*   No chief complaint on file. ***  History of Present Illness:    Joseph Carr is a 47 y.o. male with coronary artery disease s/p NSTEMI in 2015 treated with DES to the RI, hypertension, hyperlipidemia, tobacco abuse.  Cardiac catheterization in 2016 demonstrated patent stent in the RI but 90% stenosis distal to the previous stent.  This was treated with a DES.  He was last seen in clinic by Robbie Lis, PA-C 02/2017.  He was out of his medications and his blood pressure was markedly elevated.  Medications were resumed and he had follow up in the HTN Clinic.  When last seen in 04/2017, his blood pressure was much improved.  He called in last month with chest pain and was instructed to go to the ED.   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  Overall low to intermediate risk scan.  LV Ejection Fraction: 51%.   Cardiac Catheterization 10/20/13 LM normal LAD ok LCx ok RI 70 (FFR 0.74) RCA ok EF 55 PCI:  2.5 x 20 mm Promus DES to RI  Past Medical History:  Diagnosis Date  . CAD (coronary artery disease)    a. s/p STEMI 10/2013 - DES to ramus intermedius. Patent LM, LAD, RCA, EF 55%;  b. 06/2014 Cath/PCI: LM nl, LAD 15p, RI patent stent prox, 13m (2.5x12  Promus Premier DES), LCX 40p, EF 55-65%.  Marland Kitchen Hx of cardiovascular stress test    ETT/Lexiscan Myoview (11/15):  Inferior/inferolateral fixed defect c/w soft tissue attenuation and / or subendocardial scar No significant ischemia. Overall low to intermediate risk scan.LV Ejection Fraction: 51%.  . Hyperlipidemia LDL goal <70   . Hypertension   . Low TSH level   . Tobacco abuse    Surgical Hx: The patient  has a past surgical history that includes Finger fracture surgery (Left, 2014); left heart catheterization with coronary angiogram (N/A, 10/20/2013); Cardiac catheterization (N/A, 07/03/2014); Cardiac catheterization (N/A, 07/03/2014); Percutaneous coronary stent intervention (pci-s) (07/03/2014); and Coronary angioplasty.   Current Medications: No outpatient medications have been marked as taking for the 07/10/17 encounter (Appointment) with Tereso Newcomer T, PA-C.     Allergies:   Ace inhibitors   Social History   Tobacco Use  . Smoking status: Former Smoker    Packs/day: 0.75    Years: 30.00    Pack years: 22.50    Types: Cigarettes    Last attempt to quit: 07/03/2014    Years since quitting: 3.0  . Smokeless tobacco: Never Used  Substance Use Topics  . Alcohol use: Yes  . Drug use: No     Family Hx: The patient's family history includes Heart attack in his father; Hypertension in his father; Stroke in his father.  ROS:   Please see the  history of present illness.    ROS All other systems reviewed and are negative.   EKGs/Labs/Other Test Reviewed:    EKG:  EKG is *** ordered today.  The ekg ordered today demonstrates ***  Recent Labs: 02/26/2017: ALT 40; BUN 10; Creatinine, Ser 0.95; Potassium 3.9; Sodium 141   Recent Lipid Panel Lab Results  Component Value Date/Time   CHOL 161 02/26/2017 01:14 AM   TRIG 82 02/26/2017 01:14 AM   HDL 75 02/26/2017 01:14 AM   CHOLHDL 2.1 02/26/2017 01:14 AM   CHOLHDL 4.0 02/12/2015 12:57 PM   LDLCALC 70 02/26/2017 01:14 AM     Physical Exam:    VS:  There were no vitals taken for this visit.    Wt Readings from Last 3 Encounters:  02/12/17 156 lb 6.4 oz (70.9 kg)  10/26/15 169 lb 1.9 oz (76.7 kg)  06/15/15 173 lb 1.9 oz (78.5 kg)     ***Physical Exam  ASSESSMENT & PLAN:    Coronary artery disease involving native coronary artery of native heart without angina pectoris  Essential hypertension  Hyperlipidemia LDL goal <70  Tobacco abuse***  Dispo:  No follow-ups on file.   Medication Adjustments/Labs and Tests Ordered: Current medicines are reviewed at length with the patient today.  Concerns regarding medicines are outlined above.  Tests Ordered: No orders of the defined types were placed in this encounter.  Medication Changes: No orders of the defined types were placed in this encounter.   Signed, [name], PA-C  07/10/2017 1:40 PM    [facility] [address], [place], [place]  [address] Phone: [phone](336) [phone]; Fax: [phone]) [phone]

## 2017-08-13 ENCOUNTER — Encounter: Payer: Self-pay | Admitting: Cardiology

## 2017-08-18 ENCOUNTER — Other Ambulatory Visit: Payer: Self-pay | Admitting: Cardiology

## 2017-08-18 DIAGNOSIS — I10 Essential (primary) hypertension: Secondary | ICD-10-CM

## 2017-09-27 ENCOUNTER — Ambulatory Visit: Payer: No Typology Code available for payment source | Admitting: Cardiology

## 2017-11-26 ENCOUNTER — Ambulatory Visit: Payer: No Typology Code available for payment source | Admitting: Cardiology

## 2017-11-27 ENCOUNTER — Encounter: Payer: Self-pay | Admitting: Cardiology

## 2018-04-03 ENCOUNTER — Other Ambulatory Visit: Payer: Self-pay | Admitting: Cardiology

## 2018-05-14 ENCOUNTER — Other Ambulatory Visit: Payer: Self-pay | Admitting: Cardiology

## 2018-05-14 NOTE — Telephone Encounter (Signed)
Zetia and HCTZ refilled.

## 2018-07-02 ENCOUNTER — Other Ambulatory Visit: Payer: Self-pay | Admitting: Cardiology

## 2018-07-02 DIAGNOSIS — I10 Essential (primary) hypertension: Secondary | ICD-10-CM

## 2018-08-10 ENCOUNTER — Other Ambulatory Visit: Payer: Self-pay | Admitting: Cardiology

## 2018-09-02 ENCOUNTER — Telehealth: Payer: Self-pay | Admitting: Pharmacist

## 2018-09-02 ENCOUNTER — Telehealth: Payer: Self-pay | Admitting: Cardiology

## 2018-09-02 NOTE — Telephone Encounter (Addendum)
PA for praluent extended to 08/31/2019. PA had previously expired pm 04/27/17- patient still on therapy? Left voicemail for patient to return call

## 2018-09-03 NOTE — Telephone Encounter (Signed)
Encounter not needed

## 2018-09-04 ENCOUNTER — Emergency Department (HOSPITAL_COMMUNITY)
Admission: EM | Admit: 2018-09-04 | Discharge: 2018-09-04 | Disposition: A | Discharge status: Home / Self Care | Payer: No Typology Code available for payment source | Attending: Emergency Medicine | Admitting: Emergency Medicine

## 2018-09-04 ENCOUNTER — Encounter (HOSPITAL_COMMUNITY): Payer: Self-pay | Admitting: Emergency Medicine

## 2018-09-04 DIAGNOSIS — Z7982 Long term (current) use of aspirin: Secondary | ICD-10-CM | POA: Insufficient documentation

## 2018-09-04 DIAGNOSIS — I2510 Atherosclerotic heart disease of native coronary artery without angina pectoris: Secondary | ICD-10-CM | POA: Insufficient documentation

## 2018-09-04 DIAGNOSIS — Z87891 Personal history of nicotine dependence: Secondary | ICD-10-CM | POA: Insufficient documentation

## 2018-09-04 DIAGNOSIS — N4889 Other specified disorders of penis: Secondary | ICD-10-CM | POA: Insufficient documentation

## 2018-09-04 DIAGNOSIS — I252 Old myocardial infarction: Secondary | ICD-10-CM | POA: Diagnosis not present

## 2018-09-04 DIAGNOSIS — I10 Essential (primary) hypertension: Secondary | ICD-10-CM | POA: Insufficient documentation

## 2018-09-04 DIAGNOSIS — Z79899 Other long term (current) drug therapy: Secondary | ICD-10-CM | POA: Diagnosis not present

## 2018-09-04 DIAGNOSIS — R600 Localized edema: Secondary | ICD-10-CM | POA: Diagnosis not present

## 2018-09-04 LAB — URINALYSIS, ROUTINE W REFLEX MICROSCOPIC
Bilirubin Urine: NEGATIVE
Glucose, UA: NEGATIVE mg/dL
Hgb urine dipstick: NEGATIVE
Ketones, ur: 5 mg/dL — AB
Leukocytes,Ua: NEGATIVE
Nitrite: NEGATIVE
Protein, ur: NEGATIVE mg/dL
Specific Gravity, Urine: 1.014 (ref 1.005–1.030)
pH: 5 (ref 5.0–8.0)

## 2018-09-04 MED ORDER — METHYLPREDNISOLONE SODIUM SUCC 125 MG IJ SOLR
125.0000 mg | Freq: Once | INTRAMUSCULAR | Status: AC
  Administered 2018-09-04: 125 mg via INTRAVENOUS
  Filled 2018-09-04: qty 2

## 2018-09-04 MED ORDER — PREDNISONE 20 MG PO TABS: 40.0000 mg | ORAL_TABLET | Freq: Every day | ORAL | 0 refills | Status: DC

## 2018-09-04 MED ORDER — DIPHENHYDRAMINE HCL 50 MG/ML IJ SOLN
25.0000 mg | Freq: Once | INTRAMUSCULAR | Status: AC
  Administered 2018-09-04: 12:00:00 25 mg via INTRAVENOUS
  Filled 2018-09-04: qty 1

## 2018-09-04 NOTE — ED Triage Notes (Signed)
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 Pt states immediately was just pain and some swelling and this morning when he got to work the swelling became a lot worse.

## 2018-09-04 NOTE — Discharge Instructions (Addendum)
Please monitor for any new or worsening signs or symptoms.  If any present please return immediately to the emergency room.  Please use medication as directed.  Please use Benadryl in addition to the prednisone.

## 2018-09-04 NOTE — ED Provider Notes (Signed)
[facility] Provider Note   CSN: [id number] Arrival date & time: 09/04/18  [id number]    History   Chief Complaint Chief Complaint  Patient presents with   Insect Bite    HPI Joseph Carr is a 48 y.o. male.     HPI   48 year old male presents today with complaints of penile pain and swelling.  Patient notes last night he was mowing his grass when he felt something crawling up his leg he felt something bite his penis.  He notes it is extremely itchy.  He notes it swelled up at that time.  He woke up this morning and the swelling was improved, but after going into work felt more pain and swelling.  He notes the swelling is around the distal aspect of his penis but has pain up the shaft.  He notes he is still able to urinate, denies any penile discharge or dysuria.  He denies any history of the same.  He is circumcised.  Past Medical History:  Diagnosis Date   CAD (coronary artery disease)    a. s/p STEMI 10/2013 - DES to ramus intermedius. Patent LM, LAD, RCA, EF 55%;  b. 06/2014 Cath/PCI: LM nl, LAD 15p, RI patent stent prox, 579m (2.5x12 Promus Premier DES), LCX 40p, EF 55-65%.   Hx of cardiovascular stress test    ETT/Lexiscan Myoview (11/15):  Inferior/inferolateral fixed defect c/w soft tissue attenuation and / or subendocardial scar No significant ischemia. Overall low to intermediate risk scan.LV Ejection Fraction: 51%.   Hyperlipidemia LDL goal <70    Hypertension    Low TSH level    Tobacco abuse     Patient Active Problem List   Diagnosis Date Noted   Tobacco abuse 10/30/2013   CAD (coronary artery disease) 10/21/2013    Class: Acute   Presence of drug coated stent in Circumflex-Ramus Intermedius branch: Promus DES 2.5 mm x 20 mm (2.75 mm) placed 10/20/13 10/21/2013   Low serum thyroid stimulating hormone (TSH) 10/19/2013   Essential hypertension     Class: Chronic   Hyperlipidemia LDL goal <70     Past Surgical  History:  Procedure Laterality Date   CARDIAC CATHETERIZATION N/A 07/03/2014   Procedure: Left Heart Cath and Coronary Angiography;  Surgeon: Peter M SwazilandJordan, MD;  [facility];  Service: Cardiovascular;  Laterality: N/A;   CARDIAC CATHETERIZATION N/A 07/03/2014   Procedure: Coronary Stent Intervention;  Surgeon: Peter M SwazilandJordan, MD;  [facility];  Service: Cardiovascular;  Laterality: N/A;   CORONARY ANGIOPLASTY     FINGER FRACTURE SURGERY Left 2014   4th finger   LEFT HEART CATHETERIZATION WITH CORONARY ANGIOGRAM N/A 10/20/2013   Procedure: LEFT HEART CATHETERIZATION WITH CORONARY ANGIOGRAM;  Surgeon: Corky CraftsJayadeep S Varanasi, MD;  [facility];  Service: Cardiovascular;  Laterality: N/A;   PERCUTANEOUS CORONARY STENT INTERVENTION (PCI-S)  07/03/2014   RAMUS          Home Medications    Prior to Admission medications   Medication Sig Start Date End Date Taking? Authorizing Provider  Alirocumab (PRALUENT) 75 MG/ML SOPN Inject 1 pen into the skin every 14 (fourteen) days. 05/01/17   Quintella Reicherturner, Traci R, MD  aspirin EC 81 MG tablet Take 1 tablet (81 mg total) by mouth daily. 10/26/15   Quintella Reicherturner, Traci R, MD  atorvastatin (LIPITOR) 80 MG tablet TAKE ONE TABLET BY MOUTH ONCE DAILY AT  [facility]6PM 02/12/17   Allayne ButcherSimmons, Brittainy M, PA-C  carvedilol (COREG) 6.25 MG tablet Take 1 tablet (6.25 mg total) by mouth 2 (two) times daily. Please make overdue appt with Dr. Mayford Knifeurner before anymore refills. 1st attempt 04/04/18   Quintella Reicherturner, Traci R, MD  ezetimibe (ZETIA) 10 MG tablet Take 1 tablet by mouth once daily 05/14/18   Robbie LisSimmons, Brittainy M, PA-C  hydrochlorothiazide (HYDRODIURIL) 25 MG tablet Take 1 tablet by mouth once daily 05/14/18   Robbie LisSimmons, Brittainy M, PA-C  losartan (COZAAR) 100 MG tablet Take 1 tablet by mouth once daily 07/03/18   Robbie LisSimmons, Brittainy M, PA-C  nitroGLYCERIN (NITROSTAT) 0.4 MG SL tablet DISSOLVE ONE TABLET UNDER THE TONGUE EVERY 5 MINUTES AS NEEDED FOR CHEST PAIN.   DO NOT EXCEED A TOTAL OF 3 DOSES IN 15 MINUTES 06/27/16   Turner, Cornelious Bryantraci R, MD  predniSONE (DELTASONE) 20 MG tablet Take 2 tablets (40 mg total) by mouth daily. 09/04/18   Eyvonne MechanicHedges, , PA-C    Family History Family History  Problem Relation Age of Onset   Heart attack Father    Hypertension Father    Stroke Father     Social History Social History   Tobacco Use   Smoking status: Former Smoker    Packs/day: 0.75    Years: 30.00    Pack years: 22.50    Types: Cigarettes    Quit date: 07/03/2014    Years since quitting: 4.1   Smokeless tobacco: Never Used  Substance Use Topics   Alcohol use: Yes   Drug use: No     Allergies   Ace inhibitors   Review of Systems Review of Systems  All other systems reviewed and are negative.    Physical Exam Updated Vital Signs BP 129/79 (BP Location: Right Arm)    Pulse 84    Temp 98.6 F (37 C) (Oral)    SpO2 100%   Physical Exam Vitals signs and nursing note reviewed.  Constitutional:      Appearance: He is well-developed.  HENT:     Head: Normocephalic and atraumatic.  Eyes:     General: No scleral icterus.       Right eye: No discharge.        Left eye: No discharge.     Conjunctiva/sclera: Conjunctivae normal.     Pupils: Pupils are equal, round, and reactive to light.  Neck:     Musculoskeletal: Normal range of motion.     Vascular: No JVD.     Trachea: No tracheal deviation.  Pulmonary:     Effort: Pulmonary effort is normal.     Breath sounds: No stridor.  Genitourinary:    Comments: Circumferential swelling around the neck of the penis, capillary refill intact, firmness noted at the shaft of the penis-no wounds noted -no obvious penile discharge. Neurological:     Mental Status: He is alert and oriented to person, place, and time.     Coordination: Coordination normal.  Psychiatric:        Behavior: Behavior normal.        Thought Content: Thought content normal.        Judgment: Judgment normal.       ED Treatments / Results  Labs (all labs ordered are listed, but only abnormal results are displayed) Labs Reviewed  URINALYSIS, ROUTINE W REFLEX MICROSCOPIC - Abnormal; Notable for the following components:      Result Value   Ketones, ur 5 (*)    All other components within normal limits  GC/CHLAMYDIA PROBE AMP ([place]) NOT AT  [place]    EKG None  Radiology No results found.  Procedures Procedures (including critical care time)  Medications Ordered in ED Medications  methylPREDNISolone sodium succinate (SOLU-MEDROL) 125 mg/2 mL injection 125 mg (125 mg Intravenous Given 09/04/18 1153)  diphenhydrAMINE (BENADRYL) injection 25 mg (25 mg Intravenous Given 09/04/18 1153)     Initial Impression / Assessment and Plan / ED Course  I have reviewed the triage vital signs and the nursing notes.  Pertinent labs & imaging results that were available during my care of the patient were reviewed by me and considered in my medical decision making (see chart for details).        Labs:   Imaging:  Consults:  Therapeutics:  Discharge Meds:   Assessment/Plan: 48 year old male presents today with complaints of penile swelling.  Likely an allergic reaction.  Gentle pressure provided relief of swelling, patient has no signs of infectious etiology.  He will be placed on prednisone and antihistamines.  He will return immediately if he develops any new or worsening signs or symptoms.  He verbalized understanding and agreement to today's plan had no further questions or concerns at the time of discharge.    Final Clinical Impressions(s) / ED Diagnoses   Final diagnoses:  Penile swelling    ED Discharge Orders         Ordered    predniSONE (DELTASONE) 20 MG tablet  Daily     09/04/18 1355           Okey Regal, PA-C 09/05/18 1843    Jola Schmidt, MD 09/07/18 2239

## 2018-09-05 LAB — GC/CHLAMYDIA PROBE AMP (~~LOC~~) NOT AT ARMC
Chlamydia: NEGATIVE
Neisseria Gonorrhea: NEGATIVE

## 2018-09-07 ENCOUNTER — Other Ambulatory Visit: Payer: Self-pay | Admitting: Cardiology

## 2018-10-15 ENCOUNTER — Other Ambulatory Visit: Payer: Self-pay | Admitting: Cardiology

## 2018-12-03 ENCOUNTER — Encounter: Payer: Self-pay | Admitting: Physician Assistant

## 2018-12-03 NOTE — Progress Notes (Signed)
Cardiology Office Note    Date:  12/06/2018   ID:  Joseph, Carr July 29, 1970, [name]  PCP:  Kelton Pillar Joseph Crazier, MD  Cardiologist:  Joseph Him, MD  Electrophysiologist:  None   Chief Complaint: overdue follow-up (last OV with HTN clinic 04/2017)  History of Present Illness:   Joseph Carr is a 48 y.o. male with history of  CAD (s/p NSTEMI 10/2013 treated with DES to RI, repeat cath for chest pain 2016 s/p DES to ramus-2 just distal to prior stent), HTN, HLD, decreased TSH level in 2015, tobacco abuse, habitual alcohol intake who presents for overdue follow-up. His last cath was in 06/2014 at time of second PCI; otherwise showed 40% prox Cx, 15% prox LAD, normal LVF. His Brilinta was previously switched to Plavix (completed Twilight study) and was continued on ASA. 2D echo 06/2015 showed normal EF 55-60%, mild mitral valve prolapse (myxomatous changes). He saw Joseph Carr in 02/2017 and blood pressure was elevated in the setting of medication noncompliance. He was only taking aspirin. He was placed on HCTZ, losartan and carvedilol. It also appears he had been on Praluent at one point but prior auth expired. He has not been seen since 04/2017. Last labs 02/2017 with LDL 70, K 3.9, Cr 0.95, normal LFTs, CBC 2017 wnl.  He returns for follow-up today having been out of his atorvastatin, carvedilol, Zetia, HCTZ, and NTG for a while. His losartan was being filled by outside source. Over the past 3 weeks he has noticed an intermittent sense of chest pressure described as focal squeeze (squeezes muscle on thigh to demonstrate), sometimes jumps across to different points on his chest. This is not associated with any other symptoms. He does notice sometimes sweating for several hours but not specifically associated with angina. The discomfort does not have a discrete pattern - sometimes happens with exertion, sometimes not; sometimes happens without doing anything, sometimes not. He  also notices sometimes if he injuries his fingers out in the yard the pain will shoot straight up his arm instantly to his heart. He has no acute pain today. He drinks 1 bottle of liquor per week. He smokes intermittently but states he's cut down from previous amounts.   Past Medical History:  Diagnosis Date   At risk for noncompliance    CAD (coronary artery disease)    a. NSTEMI 10/2013 - DES to ramus intermedius.  b. 06/2014 Cath/PCI: LM nl, LAD 15p, RI patent stent prox, 82m (2.5x12 Promus Premier DES), LCX 40p, EF 55-65%.   Habitual alcohol use    Hyperlipidemia LDL goal <70    Hypertension    Low TSH level    MVP (mitral valve prolapse)    Tobacco abuse     Past Surgical History:  Procedure Laterality Date   CARDIAC CATHETERIZATION N/A 07/03/2014   Procedure: Left Heart Cath and Coronary Angiography;  Surgeon: Joseph M Martinique, MD;  Location: [place] CV LAB;  Service: Cardiovascular;  Laterality: N/A;   CARDIAC CATHETERIZATION N/A 07/03/2014   Procedure: Coronary Stent Intervention;  Surgeon: Joseph M Martinique, MD;  Location: [place] CV LAB;  Service: Cardiovascular;  Laterality: N/A;   CORONARY ANGIOPLASTY     FINGER FRACTURE SURGERY Left 2014   4th finger   LEFT HEART CATHETERIZATION WITH CORONARY ANGIOGRAM N/A 10/20/2013   Procedure: LEFT HEART CATHETERIZATION WITH CORONARY ANGIOGRAM;  Surgeon: Joseph Booze, MD;  Location[facility] CATH LAB;  Service: Cardiovascular;  Laterality: N/A;   PERCUTANEOUS CORONARY  STENT INTERVENTION (PCI-S)  07/03/2014   RAMUS      Current Medications: Current Meds  Medication Sig   aspirin EC 81 MG tablet Take 1 tablet (81 mg total) by mouth daily.   atorvastatin (LIPITOR) 80 MG tablet TAKE ONE TABLET BY MOUTH ONCE DAILY AT  6PM   carvedilol (COREG) 6.25 MG tablet Take 6.25 mg by mouth 2 (two) times daily with a meal.   ezetimibe (ZETIA) 10 MG tablet Take 1 tablet (10 mg total) by mouth daily. PLEASE CALL THE OFFICE AT  [phone] TO SCHEDULE APPOINTMENT WITH Joseph. Mayford Carr FOR FURTHER REFILLS-2ND ATTEMPT   hydrochlorothiazide (HYDRODIURIL) 25 MG tablet TAKE 1 TABLET BY MOUTH ONCE DAILY(CALL THE OFFICE [phone] TO SET UP FOLLOW UP APPT WITH Joseph Carr)   losartan (COZAAR) 100 MG tablet Take 1 tablet by mouth once daily   nitroGLYCERIN (NITROSTAT) 0.4 MG SL tablet DISSOLVE ONE TABLET UNDER THE TONGUE EVERY 5 MINUTES AS NEEDED FOR CHEST PAIN.  DO NOT EXCEED A TOTAL OF 3 DOSES IN 15 MINUTES     Allergies:   Ace inhibitors   Social History   Socioeconomic History   Marital status: Married    Spouse name: Not on file   Number of children: Not on file   Years of education: Not on file   Highest education level: Not on file  Occupational History   Occupation: Works in Agricultural consultant strain: Not on file   Food insecurity    Worry: Not on file    Inability: Not on file   Transportation needs    Medical: Not on file    Non-medical: Not on file  Tobacco Use   Smoking status: Former Smoker    Packs/day: 0.75    Years: 30.00    Pack years: 22.50    Types: Cigarettes    Quit date: 07/03/2014    Years since quitting: 4.4   Smokeless tobacco: Never Used  Substance and Sexual Activity   Alcohol use: Yes   Drug use: No   Sexual activity: Not on file  Lifestyle   Physical activity    Days per week: Not on file    Minutes per session: Not on file   Stress: Not on file  Relationships   Social connections    Talks on phone: Not on file    Gets together: Not on file    Attends religious service: Not on file    Active member of club or organization: Not on file    Attends meetings of clubs or organizations: Not on file    Relationship status: Not on file  Other Topics Concern   Not on file  Social History Narrative   Lives with family     Family History:  The patient's family history includes Heart attack in his father; Hypertension in his father;  Stroke in his father.  ROS:   Please see the history of present illness.  All other systems are reviewed and otherwise negative.    EKGs/Labs/Other Studies Reviewed:    Studies reviewed were summarized above.   EKG:  EKG is ordered today, personally reviewed, demonstrating NSR 95bpm, minimal voltage criteria for LVH, nonspecific upsloped STT changes  Recent Labs: No results found for requested labs within last 8760 hours.  Recent Lipid Panel    Component Value Date/Time   CHOL 161 02/26/2017 0114   TRIG 82 02/26/2017 0114   HDL 75 02/26/2017 0114   CHOLHDL 2.1 02/26/2017  0114   CHOLHDL 4.0 02/12/2015 1257   VLDL 18 02/12/2015 1257   LDLCALC 70 02/26/2017 0114    PHYSICAL EXAM:    VS:  BP (!) 142/92    Pulse (!) 103    Ht 5\' 9"  (1.753 m)    Wt 146 lb 12.8 oz (66.6 kg)    SpO2 95%    BMI 21.68 kg/m   BMI: Body mass index is 21.68 kg/m.  GEN: Well nourished, well developed AAM, in no acute distress HEENT: normocephalic, atraumatic Neck: no JVD, carotid bruits, or masses Cardiac: RRR; no murmurs, rubs, or gallops, no edema  Respiratory:  clear to auscultation bilaterally, normal work of breathing GI: soft, nontender, nondistended, + BS MS: no deformity or atrophy Skin: warm and dry, no rash Neuro:  Alert and Oriented x 3, Strength and sensation are intact, follows commands Psych: euthymic mood, full affect  Wt Readings from Last 3 Encounters:  12/06/18 146 lb 12.8 oz (66.6 kg)  02/12/17 156 lb 6.4 oz (70.9 kg)  10/26/15 169 lb 1.9 oz (76.7 kg)     ASSESSMENT & PLAN:   1. CAD with atypical angina - he has developed some recurrence of episodic chest pain with mixed typical/atypical features. This is in the context of borderline sinus tachycardia and uncontrolled HTN in the setting of noncompliance which is likely contributing. Would like to re-initiate medical therapy with refilling prior regimen of HCTZ, carvedilol, atorvastatin, Zetia, and PRN SL NTG. Will arrange  Lexiscan nuc to see if we can localize a specific area of ischemia and follow response to medical therapy. Risks/benefits of stress test discussed with the patient who is agreeable. Hopefully in the interim he will demonstrate compliance with his medications which is of utmost importance. Warning symptoms reviewed with patient. Will also provide rx for SL NTG for use. Will also check CBC, CMET, TSH, lipids with labs. 2. Essential HTN - blood pressure elevated today. Check labs as above. Resume prior regimen and follow. Instructions relayed on AVS on obtaining BP cuff to follow. Will arrange close f/u virtual visit after nuc to review. 3. Hyperlipidemia - resume prior regimen and check CMET/lipids. He was on PCSK9 at one point in time but I wonder if this was started for prior uncontrolled lipids in context of noncompliance. If tolerating/taking meds at time of f/u appt, will need to arrange repeat CMET/lipids in 2 months. 4. Tobacco abuse/habitual ETOH - counseled on importance of cessation. 5. Mitral valve prolapse - no significant murmur on exam. Await nuc first to determine plan for repeating echocardiogram if needed. No signs of CHF on exam either.  Disposition: F/u with me virtually after nuclear stress test.  Medication Adjustments/Labs and Tests Ordered: Current medicines are reviewed at length with the patient today.  Concerns regarding medicines are outlined above. Medication changes, Labs and Tests ordered today are summarized above and listed in the Patient Instructions accessible in Encounters.   Signed, Laurann Montanaayna N , PA-C  12/06/2018 9:24 AM    [facility] [address], [place], [place]  [address] Phone: [phone]; Fax: [phone]

## 2018-12-06 ENCOUNTER — Other Ambulatory Visit: Payer: Self-pay

## 2018-12-06 ENCOUNTER — Ambulatory Visit: Payer: No Typology Code available for payment source | Admitting: Physician Assistant

## 2018-12-06 ENCOUNTER — Encounter: Payer: Self-pay | Admitting: Physician Assistant

## 2018-12-06 ENCOUNTER — Encounter: Payer: Self-pay | Admitting: *Deleted

## 2018-12-06 ENCOUNTER — Telehealth: Payer: Self-pay | Admitting: *Deleted

## 2018-12-06 VITALS — BP 142/92 | HR 103 | Ht 69.0 in | Wt 146.8 lb

## 2018-12-06 DIAGNOSIS — I208 Other forms of angina pectoris: Secondary | ICD-10-CM

## 2018-12-06 DIAGNOSIS — I10 Essential (primary) hypertension: Secondary | ICD-10-CM | POA: Diagnosis not present

## 2018-12-06 DIAGNOSIS — E785 Hyperlipidemia, unspecified: Secondary | ICD-10-CM

## 2018-12-06 DIAGNOSIS — I2510 Atherosclerotic heart disease of native coronary artery without angina pectoris: Secondary | ICD-10-CM | POA: Diagnosis not present

## 2018-12-06 DIAGNOSIS — Z720 Tobacco use: Secondary | ICD-10-CM

## 2018-12-06 DIAGNOSIS — I341 Nonrheumatic mitral (valve) prolapse: Secondary | ICD-10-CM

## 2018-12-06 DIAGNOSIS — I2089 Other forms of angina pectoris: Secondary | ICD-10-CM

## 2018-12-06 LAB — COMPREHENSIVE METABOLIC PANEL
ALT: 37 IU/L (ref 0–44)
AST: 63 IU/L — ABNORMAL HIGH (ref 0–40)
Albumin/Globulin Ratio: 1.6 (ref 1.2–2.2)
Albumin: 4.8 g/dL (ref 4.0–5.0)
Alkaline Phosphatase: 59 IU/L (ref 39–117)
BUN/Creatinine Ratio: 13 (ref 9–20)
BUN: 13 mg/dL (ref 6–24)
Bilirubin Total: 0.4 mg/dL (ref 0.0–1.2)
CO2: 21 mmol/L (ref 20–29)
Calcium: 9.7 mg/dL (ref 8.7–10.2)
Chloride: 104 mmol/L (ref 96–106)
Creatinine, Ser: 0.98 mg/dL (ref 0.76–1.27)
GFR calc Af Amer: 104 mL/min/{1.73_m2} (ref >59)
GFR calc non Af Amer: 90 mL/min/{1.73_m2} (ref >59)
Globulin, Total: 3 g/dL (ref 1.5–4.5)
Glucose: 87 mg/dL (ref 65–99)
Potassium: 4.8 mmol/L (ref 3.5–5.2)
Sodium: 141 mmol/L (ref 134–144)
Total Protein: 7.8 g/dL (ref 6.0–8.5)

## 2018-12-06 LAB — TSH: TSH: 0.438 u[IU]/mL — ABNORMAL LOW (ref 0.450–4.500)

## 2018-12-06 LAB — LIPID PANEL
Chol/HDL Ratio: 2.8 ratio (ref 0.0–5.0)
Cholesterol, Total: 202 mg/dL — ABNORMAL HIGH (ref 100–199)
HDL: 71 mg/dL (ref >39)
LDL Chol Calc (NIH): 85 mg/dL (ref 0–99)
Triglycerides: 285 mg/dL — ABNORMAL HIGH (ref 0–149)
VLDL Cholesterol Cal: 46 mg/dL — ABNORMAL HIGH (ref 5–40)

## 2018-12-06 LAB — CBC
Hematocrit: 43 % (ref 37.5–51.0)
Hemoglobin: 14.4 g/dL (ref 13.0–17.7)
MCH: 28.5 pg (ref 26.6–33.0)
MCHC: 33.5 g/dL (ref 31.5–35.7)
MCV: 85 fL (ref 79–97)
Platelets: 287 10*3/uL (ref 150–450)
RBC: 5.05 x10E6/uL (ref 4.14–5.80)
RDW: 13.8 % (ref 11.6–15.4)
WBC: 3.3 10*3/uL — ABNORMAL LOW (ref 3.4–10.8)

## 2018-12-06 MED ORDER — NITROGLYCERIN 0.4 MG SL SUBL: SUBLINGUAL_TABLET | SUBLINGUAL | 3 refills | Status: DC

## 2018-12-06 MED ORDER — EZETIMIBE 10 MG PO TABS: 10.0000 mg | ORAL_TABLET | Freq: Every day | ORAL | 3 refills | Status: DC

## 2018-12-06 MED ORDER — ATORVASTATIN CALCIUM 80 MG PO TABS: ORAL_TABLET | ORAL | 3 refills | Status: DC

## 2018-12-06 MED ORDER — HYDROCHLOROTHIAZIDE 25 MG PO TABS: ORAL_TABLET | ORAL | 3 refills | Status: DC

## 2018-12-06 MED ORDER — CARVEDILOL 6.25 MG PO TABS: 6.2500 mg | ORAL_TABLET | Freq: Two times a day (BID) | ORAL | 3 refills | Status: DC

## 2018-12-06 NOTE — Patient Instructions (Addendum)
Medication Instructions:  Your physician recommends that you continue on your current medications as directed. Please refer to the Current Medication list given to you today.  We have sent in refills for: Atorvastatin Carvedilol Zetia Hydrochlorothiazide Nitroglycerin  *If you need a refill on your cardiac medications before your next appointment, please call your pharmacy*  Lab Work: TODAY:  CMET, CBC, TSH, & LIPIDS  If you have labs (blood work) drawn today and your tests are completely normal, you will receive your results only by: Marland Kitchen MyChart Message (if you have MyChart) OR . A paper copy in the mail If you have any lab test that is abnormal or we need to change your treatment, we will call you to review the results.  Testing/Procedures: Your physician has requested that you have a lexiscan myoview. For further information please visit [email or web address]. Please follow instruction sheet, as given.    Follow-Up: At [facility], you and your health needs are our priority.  As part of our continuing mission to provide you with exceptional heart care, we have created designated Provider Care Teams.  These Care Teams include your primary Cardiologist (physician) and Advanced Practice Providers (APPs -  Physician Assistants and Nurse Practitioners) who all work together to provide you with the care you need, when you need it.  Your next appointment:   [place]  The format for your next appointment:   Virtual Visit   Provider:   Melina Copa, PA-C  Other Instructions Please get a blood pressure cuff that goes on your arm. The wrist ones can be inaccurate. If possible, try to select one that also reports your heart rate. To check your blood pressure, choose a time about 3 hours after taking your blood pressure medicines. Remain seated in a chair for 5 minutes quietly beforehand, then check it. When you get a cuff, please check your blood pressure for 2 days and call  us/send in MyChart message with them for our review.   It is very important to cut down your alcohol and tobacco use long term.

## 2018-12-06 NOTE — Telephone Encounter (Signed)
Virtual Visit Pre-Appointment Phone Call  "(Name), I am calling you today to discuss your upcoming appointment. We are currently trying to limit exposure to the virus that causes COVID-19 by seeing patients at home rather than in the office."  1. "What is the BEST phone number to call the day of the visit?" - include this in appointment notes  2. "Do you have or have access to (through a family member/friend) a smartphone with video capability that we can use for your visit?" a. If yes - list this number in appt notes as "cell" (if different from BEST phone #) and list the appointment type as a VIDEO visit in appointment notes b. If no - list the appointment type as a PHONE visit in appointment notes  3. Confirm consent - "In the setting of the current Covid19 crisis, you are scheduled for a (phone or video) visit with your provider on (date) at (time).  Just as we do with many in-office visits, in order for you to participate in this visit, we must obtain consent.  If you'd like, I can send this to your mychart (if signed up) or email for you to review.  Otherwise, I can obtain your verbal consent now.  All virtual visits are billed to your insurance company just like a normal visit would be.  By agreeing to a virtual visit, we'd like you to understand that the technology does not allow for your provider to perform an examination, and thus may limit your provider's ability to fully assess your condition. If your provider identifies any concerns that need to be evaluated in person, we will make arrangements to do so.  Finally, though the technology is pretty good, we cannot assure that it will always work on either your or our end, and in the setting of a video visit, we may have to convert it to a phone-only visit.  In either situation, we cannot ensure that we have a secure connection.  Are you willing to proceed?" STAFF: Did the patient verbally acknowledge consent to telehealth visit? Document  YES/NO here: YES.  Pt gave verbal consent on his appt 12/06/2018  4. Advise patient to be prepared - "Two hours prior to your appointment, go ahead and check your blood pressure, pulse, oxygen saturation, and your weight (if you have the equipment to check those) and write them all down. When your visit starts, your provider will ask you for this information. If you have an Apple Watch or Kardia device, please plan to have heart rate information ready on the day of your appointment. Please have a pen and paper handy nearby the day of the visit as well."  5. Give patient instructions for MyChart download to smartphone OR Doximity/Doxy.me as below if video visit (depending on what platform provider is using)  6. Inform patient they will receive a phone call 15 minutes prior to their appointment time (may be from unknown caller ID) so they should be prepared to answer    TELEPHONE CALL NOTE  Joseph Carr has been deemed a candidate for a follow-up tele-health visit to limit community exposure during the Covid-19 pandemic. I spoke with the patient via phone to ensure availability of phone/video source, confirm preferred email & phone number, and discuss instructions and expectations.  I reminded Joseph Carr to be prepared with any vital sign and/or heart rhythm information that could potentially be obtained via home monitoring, at the time of his visit. I reminded Joseph Fearing  DEREON Carr to expect a phone call prior to his visit.  Jeanann Lewandowsky, [place] 12/06/2018 10:03 AM   INSTRUCTIONS FOR DOWNLOADING THE MYCHART APP TO SMARTPHONE  - The patient must first make sure to have activated MyChart and know their login information - If Apple, go to [employer] and type in MyChart in the search bar and download the app. If Android, ask patient to go to [employer] and type in [place] in the search bar and download the app. The app is free but as with any other app downloads, their phone may require them  to verify saved payment information or Apple/Android password.  - The patient will need to then log into the app with their MyChart username and password, and select [facility] as their healthcare provider to link the account. When it is time for your visit, go to the MyChart app, find appointments, and click Begin Video Visit. Be sure to Select Allow for your device to access the Microphone and Camera for your visit. You will then be connected, and your provider will be with you shortly.  **If they have any issues connecting, or need assistance please contact MyChart service desk [phone])**  **If using a computer, in order to ensure the best quality for their visit they will need to use either of the following Internet Browsers: [employer], or Google Chrome**  IF USING DOXIMITY or DOXY.ME - The patient will receive a link just prior to their visit by text.     FULL LENGTH CONSENT FOR TELE-HEALTH VISIT   I hereby voluntarily request, consent and authorize [facility] and its employed or contracted physicians, physician assistants, nurse practitioners or other licensed health care professionals (the Practitioner), to provide me with telemedicine health care services (the "Services") as deemed necessary by the treating Practitioner. I acknowledge and consent to receive the Services by the Practitioner via telemedicine. I understand that the telemedicine visit will involve communicating with the Practitioner through live audiovisual communication technology and the disclosure of certain medical information by electronic transmission. I acknowledge that I have been given the opportunity to request an in-person assessment or other available alternative prior to the telemedicine visit and am voluntarily participating in the telemedicine visit.  I understand that I have the right to withhold or withdraw my consent to the use of telemedicine in the course of my care at any time,  without affecting my right to future care or treatment, and that the Practitioner or I may terminate the telemedicine visit at any time. I understand that I have the right to inspect all information obtained and/or recorded in the course of the telemedicine visit and may receive copies of available information for a reasonable fee.  I understand that some of the potential risks of receiving the Services via telemedicine include:  Marland Kitchen Delay or interruption in medical evaluation due to technological equipment failure or disruption; . Information transmitted may not be sufficient (e.g. poor resolution of images) to allow for appropriate medical decision making by the Practitioner; and/or  . In rare instances, security protocols could fail, causing a breach of personal health information.  Furthermore, I acknowledge that it is my responsibility to provide information about my medical history, conditions and care that is complete and accurate to the best of my ability. I acknowledge that Practitioner's advice, recommendations, and/or decision may be based on factors not within their control, such as incomplete or inaccurate data provided by me or distortions of diagnostic images  or specimens that may result from electronic transmissions. I understand that the practice of medicine is not an exact science and that Practitioner makes no warranties or guarantees regarding treatment outcomes. I acknowledge that I will receive a copy of this consent concurrently upon execution via email to the email address I last provided but may also request a printed copy by calling the office of [facility].    I understand that my insurance will be billed for this visit.   I have read or had this consent read to me. . I understand the contents of this consent, which adequately explains the benefits and risks of the Services being provided via telemedicine.  . I have been provided ample opportunity to ask questions regarding  this consent and the Services and have had my questions answered to my satisfaction. . I give my informed consent for the services to be provided through the use of telemedicine in my medical care  By participating in this telemedicine visit I agree to the above.

## 2018-12-09 ENCOUNTER — Telehealth: Payer: Self-pay | Admitting: *Deleted

## 2018-12-09 ENCOUNTER — Telehealth (HOSPITAL_COMMUNITY): Payer: Self-pay | Admitting: *Deleted

## 2018-12-09 DIAGNOSIS — R748 Abnormal levels of other serum enzymes: Secondary | ICD-10-CM

## 2018-12-09 DIAGNOSIS — Z79899 Other long term (current) drug therapy: Secondary | ICD-10-CM

## 2018-12-09 NOTE — Telephone Encounter (Signed)
Left message on voicemail in reference to upcoming appointment scheduled for 12/13/18. Phone number given for a call back so details instructions can be given.  ,  Jacqueline  

## 2018-12-09 NOTE — Telephone Encounter (Signed)
Follow Up  Patient returning call. Please give patient a call back.  

## 2018-12-09 NOTE — Telephone Encounter (Signed)
-----   Message from Charlie Pitter, [place] sent at 12/06/2018  5:18 PM EDT ----- Please let pt know overall labs look ok except for a few notable abnormalities:- - liver function remains mildly abnormal.we will need to keep a close eye on this in the future given cholesterol medication. Let's actually scale back to restarting JUST atorvastatin for now, but we will hang onto Zetia if he needs it in the future - cholesterol not well controlled but I am not surprised given that he hadn't been on meds recently. Tentatively arrange f/u liver/lipids in 4 weeks please - thyroid function remains abnormal, and has been this way for many years - possibly representing hyperthyroidism. Please refer to endocrinology as this could be causing some of his CP, elevated HR and BP as well Otherwise continue plan as outlined Melina Copa PA-C

## 2018-12-13 ENCOUNTER — Other Ambulatory Visit: Payer: Self-pay

## 2018-12-13 ENCOUNTER — Ambulatory Visit (HOSPITAL_COMMUNITY): Payer: No Typology Code available for payment source | Attending: Cardiovascular Disease

## 2018-12-13 DIAGNOSIS — I208 Other forms of angina pectoris: Secondary | ICD-10-CM | POA: Diagnosis present

## 2018-12-13 LAB — MYOCARDIAL PERFUSION IMAGING
LV dias vol: 104 mL (ref 62–150)
LV sys vol: 46 mL
Peak HR: 103 {beats}/min
Rest HR: 73 {beats}/min
SDS: 0
SRS: 0
SSS: 0
TID: 0.99

## 2018-12-13 MED ORDER — TECHNETIUM TC 99M TETROFOSMIN IV KIT
9.6000 | PACK | Freq: Once | INTRAVENOUS | Status: AC | PRN
  Administered 2018-12-13: 9.6 via INTRAVENOUS
  Filled 2018-12-13: qty 10

## 2018-12-13 MED ORDER — REGADENOSON 0.4 MG/5ML IV SOLN
0.4000 mg | Freq: Once | INTRAVENOUS | Status: AC
  Administered 2018-12-13: 0.4 mg via INTRAVENOUS

## 2018-12-13 MED ORDER — TECHNETIUM TC 99M TETROFOSMIN IV KIT
30.3000 | PACK | Freq: Once | INTRAVENOUS | Status: AC | PRN
  Administered 2018-12-13: 30.3 via INTRAVENOUS
  Filled 2018-12-13: qty 31

## 2018-12-16 ENCOUNTER — Telehealth: Payer: Self-pay | Admitting: *Deleted

## 2018-12-16 NOTE — Telephone Encounter (Signed)
Call placed to pt re: stress test results, no answer.

## 2018-12-16 NOTE — Telephone Encounter (Signed)
-----   Message from Charlie Pitter, [place] sent at 12/14/2018  7:43 AM EST ----- Please let pt know stress test was low risk, normal perfusion. Continue plan as discussed. Let us know if BP still running >130/80. Dayna Dunn PA-C

## 2018-12-22 NOTE — Progress Notes (Signed)
Virtual Visit via Video Note   This visit type was conducted due to national recommendations for restrictions regarding the COVID-19 Pandemic (e.g. social distancing) in an effort to limit this patient's exposure and mitigate transmission in our community.  Due to his co-morbid illnesses, this patient is at least at moderate risk for complications without adequate follow up.  This format is felt to be most appropriate for this patient at this time.  All issues noted in this document were discussed and addressed.  A limited physical exam was performed with this format.  Please refer to the patient's chart for his consent to telehealth for [facility]. We did have to switch to audio-only call after video during visit.  Date:  12/23/2018   ID:  Joseph Carr, DOB [birth year], MRN [id number]  Patient Location: Work Provider Location: Home  PCP:  Leeroy Cha, MD  Cardiologist:  Fransico Him, MD  Electrophysiologist:  None   Evaluation Performed:  Follow-Up Visit  Chief Complaint:  F/u stress test/BP/chest pain  History of Present Illness:    Joseph Carr is a 48 y.o. male with CAD (s/p NSTEMI 10/2013 treated with DES to RI, repeat cath for chest pain 2016 s/p DES to ramus-2 just distal to prior stent), HTN, HLD, possible hyperthyroidism, tobacco abuse, habitual alcohol intake, and intermittent noncompliance who presents for virtual follow-up.   His last cath was in 06/2014 at time of second PCI. This otherwise showed 40% prox Cx, 15% prox LAD, normal LVF. His Brilinta was previously switched to Plavix after completing Twilight study and was he continued on ASA. 2D echo 06/2015 showed normal EF 55-60%, mild mitral valve prolapse (myxomatous changes). He saw Lyda Jester in 02/2017 and blood pressure was elevated in the setting of medication noncompliance. He was only taking aspirin. He was no longer taking Plavix. He was placed back on HCTZ, losartan and carvedilol. It also  appears he had been on Praluent at one point but prior authorization expired. (I now wonder if this was ordered on the basis of uncontrolled cholesterol in the setting of cryptic medication noncompliance.)  He recently presented back to the office 12/06/18 for overdue follow-up having been out of his atorvastatin, carvedilol, Zetia, HCTZ, and NTG for a while. His losartan had been continued by an outside source. He was reporting intermittent chest discomfort with mixed atypical/typical features. He was also drinking 1 bottle of liquor a week. We resumed his prior regimen of HCTZ, carvedilol, atorvastatin, and Zetia. We pursued a nuclear stress test 12/13/18 which showed diaphragmatic attenuation but no ischemia, EF 56%, low risk study. He was also found to have a persistently suppressed TSH suspicious for possible hyperthyroidism so was referred to endocrinology. Last labs 11/2018 showed LDL 85, Trig 285, WBC 3.3, Hgb 14.4, normal CMET except AST was 63. Based on LDL and AST I elected to hold off Zetia for now. He was counseled on tobacco/ETOH cessation.   He is seen back virtually for follow-up. He got a BP cuff and has been monitoring this at home but has does not recall the values. He's currently at work. He has felt generally better since last visit. Since last visit he had one brief "zap" of chest pain where it felt like someone kicked his heart but it lasted a second then resolved. He is able to exert himself without angina. No dyspnea, edema, orthopnea, or palpitations reported. He believes he sees endocrinology on 12/3.  The patient does not have symptoms concerning for COVID-19  infection (fever, chills, cough, or new shortness of breath).    Past Medical History:  Diagnosis Date  . At risk for noncompliance   . CAD (coronary artery disease)    a. NSTEMI 10/2013 - DES to ramus intermedius.  b. 06/2014 Cath/PCI: LM nl, LAD 15p, RI patent stent prox, 8020m (2.5x12 Promus Premier DES), LCX 40p, EF  55-65%.  . Habitual alcohol use   . Hyperlipidemia LDL goal <70   . Hypertension   . Low TSH level   . MVP (mitral valve prolapse)   . Tobacco abuse    Past Surgical History:  Procedure Laterality Date  . CARDIAC CATHETERIZATION N/A 07/03/2014   Procedure: Left Heart Cath and Coronary Angiography;  Surgeon: Peter M SwazilandJordan, MD;  [facility];  Service: Cardiovascular;  Laterality: N/A;  . CARDIAC CATHETERIZATION N/A 07/03/2014   Procedure: Coronary Stent Intervention;  Surgeon: Peter M SwazilandJordan, MD;  [facility];  Service: Cardiovascular;  Laterality: N/A;  . CORONARY ANGIOPLASTY    . FINGER FRACTURE SURGERY Left 2014   4th finger  . LEFT HEART CATHETERIZATION WITH CORONARY ANGIOGRAM N/A 10/20/2013   Procedure: LEFT HEART CATHETERIZATION WITH CORONARY ANGIOGRAM;  Surgeon: Corky CraftsJayadeep S Varanasi, MD;  [facility];  Service: Cardiovascular;  Laterality: N/A;  . PERCUTANEOUS CORONARY STENT INTERVENTION (PCI-S)  07/03/2014   RAMUS       Current Meds  Medication Sig  . aspirin EC 81 MG tablet Take 1 tablet (81 mg total) by mouth daily.  Marland Kitchen. atorvastatin (LIPITOR) 80 MG tablet TAKE ONE TABLET BY MOUTH ONCE DAILY AT  6PM  . carvedilol (COREG) 6.25 MG tablet Take 1 tablet (6.25 mg total) by mouth 2 (two) times daily with a meal.  . hydrochlorothiazide (HYDRODIURIL) 25 MG tablet TAKE 1 TABLET BY MOUTH ONCE DAILY  . losartan (COZAAR) 100 MG tablet Take 1 tablet by mouth once daily  . nitroGLYCERIN (NITROSTAT) 0.4 MG SL tablet DISSOLVE ONE TABLET UNDER THE TONGUE EVERY 5 MINUTES AS NEEDED FOR CHEST PAIN.  DO NOT EXCEED A TOTAL OF 3 DOSES IN 15 MINUTES     Allergies:   Ace inhibitors   Social History   Tobacco Use  . Smoking status: Light Tobacco Smoker    Packs/day: 0.75    Years: 30.00    Pack years: 22.50    Types: Cigarettes    Last attempt to quit: 07/03/2014    Years since quitting: 4.4  . Smokeless tobacco: Never Used  Substance Use Topics  .  Alcohol use: Yes  . Drug use: No     Family Hx: The patient's family history includes Heart attack in his father; Hypertension in his father; Stroke in his father.  ROS:   Please see the history of present illness.    All other systems reviewed and are negative.   Prior CV studies:    Most recent pertinent cardiac studies are outlined above.  Labs/Other Tests and Data Reviewed:    EKG:  An ECG dated 12/06/18 was personally reviewed today and demonstrated:  NSR 95bpm, minimal voltage criteria for LVH, nonspecific upsloped STT changes  Recent Labs: 12/06/2018: ALT 37; BUN 13; Creatinine, Ser 0.98; Hemoglobin 14.4; Platelets 287; Potassium 4.8; Sodium 141; TSH 0.438   Recent Lipid Panel Lab Results  Component Value Date/Time   CHOL 202 (H) 12/06/2018 09:49 AM   TRIG 285 (H) 12/06/2018 09:49 AM   HDL 71 12/06/2018 09:49 AM   CHOLHDL 2.8 12/06/2018 09:49  AM   CHOLHDL 4.0 02/12/2015 12:57 PM   LDLCALC 85 12/06/2018 09:49 AM    Wt Readings from Last 3 Encounters:  12/13/18 146 lb (66.2 kg)  12/06/18 146 lb 12.8 oz (66.6 kg)  02/12/17 156 lb 6.4 oz (70.9 kg)     Objective:    Vital Signs:  Ht 5\' 9"  (1.753 m)   BMI 21.56 kg/m    VS reviewed. General - pleasant AAM in no acute distress HEENT - NCAT, EOM intact Pulm - No labored breathing, no coughing during visit, no audible wheezing, speaking in full sentences Neuro - A+Ox3, no slurred speech, answers questions appropriately Psych - Pleasant affect    ASSESSMENT & PLAN:    1. CAD with intermittent chest pain - atypical. Nuclear stress test was reassuring. Discussed warning symptoms with patient. He will notify if episodes increase in frequency or change in quality. Continue risk factor modification. ETOH cessation advised. Continue ASA. I will reach out to Dr. [place] to find out if she has an opinion about him being on Plavix. The patient has not been on this since at least before 2019 when he self-discontinued it.  Given his habitual ETOH use, would make sense to remain off if felt acceptable. He is several years out from PCI. 2. Abnormal TSH with suspected hyperthyroidism - TSH is not markedly suppressed but given the cardiac implications of even subclinical hyperthyroidism, I really appreciate endocrine's input - especially since this is not the first time TSH has been low. Will arrange for him to have free T4 and T3 tested prior to endocrinology appointment.  Advised to avoid products with biotin prior to hormone assay.  3. Essential HTN - looking back through records it does not appear he's ever actually had a BMET while actively on HCTZ. Therefore, will need to arrange repeat labs to ensure Cr and K stable. We can obtain f/u TFTs at that time as well as above. He does not have his BP readings with him at work. I will have our clinic nurse call him when he gets home from work today to review. 4. Hyperlipidemia - originally we planned to repeat labs later this month. However, given the need to draw f/u BMET as well as follow up thyroid testing before his endocrinology appointment, I've opted to adjust the plan. We will get f/u CMET along with free T4 and T3 at the patient's next convenience. Will push out lipids and repeat LFTs to 02/2019. If LFTs are still abnormal he will likely need to see primary care for evaluation. His recent AST was abnormal even while noncompliant with atorvastatin. 5. Mitral valve prolapse - no recent murmur or click on recent exam. In absence of other symptoms of valve failure, would hold off on repeat evaluation at present time. Can revisit when pandemic settles down.  COVID-19 Education: Recently reviewed with patient in-office, so did not acutely revisit today.  Time:   Today, I have spent 18 minutes with the patient with telehealth technology discussing the above problems.     Medication Adjustments/Labs and Tests Ordered: Current medicines are reviewed at length with the patient  today.  Concerns regarding medicines are outlined above.   Disposition:  Follow up in 6 months with Dr. 03/2019.  Signed, Mayford Knife, PA-C  12/23/2018 4:27 PM    [facility]

## 2018-12-23 ENCOUNTER — Telehealth: Payer: Self-pay | Admitting: *Deleted

## 2018-12-23 ENCOUNTER — Telehealth (INDEPENDENT_AMBULATORY_CARE_PROVIDER_SITE_OTHER): Payer: No Typology Code available for payment source | Admitting: Physician Assistant

## 2018-12-23 ENCOUNTER — Encounter: Payer: Self-pay | Admitting: Physician Assistant

## 2018-12-23 ENCOUNTER — Other Ambulatory Visit: Payer: Self-pay

## 2018-12-23 VITALS — Ht 69.0 in

## 2018-12-23 DIAGNOSIS — E785 Hyperlipidemia, unspecified: Secondary | ICD-10-CM

## 2018-12-23 DIAGNOSIS — I2510 Atherosclerotic heart disease of native coronary artery without angina pectoris: Secondary | ICD-10-CM | POA: Diagnosis not present

## 2018-12-23 DIAGNOSIS — I341 Nonrheumatic mitral (valve) prolapse: Secondary | ICD-10-CM

## 2018-12-23 DIAGNOSIS — I10 Essential (primary) hypertension: Secondary | ICD-10-CM

## 2018-12-23 DIAGNOSIS — R7989 Other specified abnormal findings of blood chemistry: Secondary | ICD-10-CM

## 2018-12-23 NOTE — Telephone Encounter (Signed)
I spoke with pt and scheduled CMET, free T4 and total T3 lab work for 2:00 on 11/20.  Pt does not take biotin supplement and is aware not to take as it could interfere with lab results. Lipid and liver profiles scheduled for January 29,2021 at 1:15.  Pt aware this is fasting.  He works until noon so will fast for at least 6 hours prior to lab draw. Pt just got home from work today and checked BP right after walking in the door--BP was 164/99, pulse 100. Yesterday BP was 128/79 and pulse 88. No other readings available. I asked pt to check BP the next several days after he got home from work and rested for 20-30 minutes.  I told him we would call him later this week or next week to get these readings.

## 2018-12-23 NOTE — Patient Instructions (Signed)
Medication Instructions:  Your physician recommends that you continue on your current medications as directed. Please refer to the Current Medication list given to you today.  *If you need a refill on your cardiac medications before your next appointment, please call your pharmacy*  Lab Work: Your physician recommends that you return for lab work in: the next week or so--CMET, Free T4, total T3. You do not have to fast for this lab work. See message below about Biotin supplements  Your physician recommends that you return for lab work toward the end of January--Lipid and liver profiles. This will be fasting lab work.   If you have labs (blood work) drawn today and your tests are completely normal, you will receive your results only by: Marland Kitchen MyChart Message (if you have MyChart) OR . A paper copy in the mail If you have any lab test that is abnormal or we need to change your treatment, we will call you to review the results.  Testing/Procedures: none  Follow-Up: At [facility], you and your health needs are our priority.  As part of our continuing mission to provide you with exceptional heart care, we have created designated Provider Care Teams.  These Care Teams include your primary Cardiologist (physician) and Advanced Practice Providers (APPs -  Physician Assistants and Nurse Practitioners) who all work together to provide you with the care you need, when you need it.  Your next appointment:   6 months  The format for your next appointment:   In Person  Provider:   You may see Fransico Him, MD or one of the following Advanced Practice Providers on your designated Care Team:    Melina Copa, PA-C  Ermalinda Barrios, PA-C   Other Instructions Do not  take any BIOTIN supplements within 5 days of the thyroid lab draw as this can interfere with thyroid hormone results.

## 2018-12-24 NOTE — Telephone Encounter (Signed)
Await follow-up readings. Thanks!

## 2018-12-25 ENCOUNTER — Encounter: Payer: Self-pay | Admitting: Physician Assistant

## 2018-12-25 NOTE — Progress Notes (Signed)
   For documentation sake, following up to addend recent OV 11/16. I discussed with Dr. Radford Pax whether she felt Mr. Braaten needed to be on Plavix in addition to ASA (since he had previously self-discontinued Plavix). She agrees she would continue ASA only at this time.  Dayna Dunn PA-C

## 2018-12-27 ENCOUNTER — Other Ambulatory Visit: Payer: No Typology Code available for payment source | Admitting: *Deleted

## 2018-12-27 ENCOUNTER — Other Ambulatory Visit: Payer: Self-pay

## 2018-12-27 DIAGNOSIS — E785 Hyperlipidemia, unspecified: Secondary | ICD-10-CM

## 2018-12-27 DIAGNOSIS — R7989 Other specified abnormal findings of blood chemistry: Secondary | ICD-10-CM

## 2018-12-27 DIAGNOSIS — I10 Essential (primary) hypertension: Secondary | ICD-10-CM

## 2018-12-27 DIAGNOSIS — I2510 Atherosclerotic heart disease of native coronary artery without angina pectoris: Secondary | ICD-10-CM

## 2018-12-28 LAB — COMPREHENSIVE METABOLIC PANEL
ALT: 39 IU/L (ref 0–44)
AST: 39 IU/L (ref 0–40)
Albumin/Globulin Ratio: 1.5 (ref 1.2–2.2)
Albumin: 4.8 g/dL (ref 4.0–5.0)
Alkaline Phosphatase: 64 IU/L (ref 39–117)
BUN/Creatinine Ratio: 13 (ref 9–20)
BUN: 15 mg/dL (ref 6–24)
Bilirubin Total: 0.4 mg/dL (ref 0.0–1.2)
CO2: 25 mmol/L (ref 20–29)
Calcium: 10.4 mg/dL — ABNORMAL HIGH (ref 8.7–10.2)
Chloride: 102 mmol/L (ref 96–106)
Creatinine, Ser: 1.14 mg/dL (ref 0.76–1.27)
GFR calc Af Amer: 87 mL/min/{1.73_m2} (ref >59)
GFR calc non Af Amer: 75 mL/min/{1.73_m2} (ref >59)
Globulin, Total: 3.1 g/dL (ref 1.5–4.5)
Glucose: 91 mg/dL (ref 65–99)
Potassium: 4.8 mmol/L (ref 3.5–5.2)
Sodium: 143 mmol/L (ref 134–144)
Total Protein: 7.9 g/dL (ref 6.0–8.5)

## 2018-12-28 LAB — T3: T3, Total: 146 ng/dL (ref 71–180)

## 2018-12-28 LAB — T4, FREE: Free T4: 0.98 ng/dL (ref 0.82–1.77)

## 2018-12-30 ENCOUNTER — Telehealth: Payer: Self-pay | Admitting: *Deleted

## 2018-12-30 DIAGNOSIS — Z79899 Other long term (current) drug therapy: Secondary | ICD-10-CM

## 2018-12-30 NOTE — Telephone Encounter (Signed)
Spoke with pt and got bp readings from 11/17-11/22. See result note with lab results.

## 2018-12-30 NOTE — Telephone Encounter (Signed)
Pt returned my call and has been made aware of his stress test results.

## 2018-12-30 NOTE — Telephone Encounter (Signed)
Call placed to pt re: lab results, left a message for him to call back.  

## 2018-12-30 NOTE — Telephone Encounter (Signed)
-----   Message from Charlie Pitter, [place] sent at 12/30/2018  8:13 AM EST ----- Please let pt know labs are stable except calcium level is a little elevated. This can be a result of the HCTZ medication. As long as BP and HR have not been running low, I would have him decrease his HCTZ to half and increase carvedilol to 12.5mg  BID. Pat had asked him to keep a log of his BPs and said we would call him this week to check on him - can you please call him? Interestingly his follow-up thyroid function labs are normal - which may indicate he has subclinical hyperthyroidism. Continue to f/u with endocrinology as planned. If possible, please see if they would be willing to repeat a calcium level when he is there in their office in order to minimize community exposure during the pandemic. He should have info on who he is seeing next month.  Dayna Dunn PA-C

## 2018-12-31 MED ORDER — HYDROCHLOROTHIAZIDE 12.5 MG PO CAPS: 12.5000 mg | ORAL_CAPSULE | Freq: Every day | ORAL | 3 refills | Status: DC

## 2018-12-31 NOTE — Telephone Encounter (Signed)
Pt returned call and he has been made aware to decrease the HCTZ to 125 mg daily and to call With bp readings after 3-4 days. See result note.

## 2018-12-31 NOTE — Telephone Encounter (Signed)
Call placed to pt re: recommendations below.  Left a message for pt to call back.

## 2019-01-06 ENCOUNTER — Other Ambulatory Visit: Payer: No Typology Code available for payment source

## 2019-01-08 ENCOUNTER — Other Ambulatory Visit: Payer: Self-pay

## 2019-01-08 ENCOUNTER — Encounter: Payer: Self-pay | Admitting: Endocrinology

## 2019-01-08 ENCOUNTER — Ambulatory Visit (INDEPENDENT_AMBULATORY_CARE_PROVIDER_SITE_OTHER): Payer: No Typology Code available for payment source | Admitting: Endocrinology

## 2019-01-08 VITALS — BP 178/110 | HR 100 | Ht 69.0 in | Wt 154.4 lb

## 2019-01-08 DIAGNOSIS — R7989 Other specified abnormal findings of blood chemistry: Secondary | ICD-10-CM | POA: Diagnosis not present

## 2019-01-08 MED ORDER — METHIMAZOLE 5 MG PO TABS: 2.5000 mg | ORAL_TABLET | Freq: Every day | ORAL | 5 refills | Status: DC

## 2019-01-08 NOTE — Patient Instructions (Addendum)
Your blood pressure is high today.  Please see your heart provider soon, to have it rechecked I have sent a prescription to your pharmacy, to slow the thyroid. If ever you have fever while taking methimazole, stop it and call us, even if the reason is obvious, because of the risk of a rare side-effect. Please come back for a follow-up appointment in 6 weeks.

## 2019-01-08 NOTE — Progress Notes (Signed)
Subjective:    Patient ID: Joseph Carr RolesJames M Behar, male    DOB: [birth year], 48 y.o.   MRN: [id number]  HPI Pt is referred by Chales SalmonVaradarajan, for hyperthyroidism.  Pt reports he was dx'ed with hyperthyroidism in 2015.  He has never been on therapy for this.  he has never had XRT to the anterior neck, or thyroid surgery.  he has never had thyroid imaging.  he does not consume kelp or any other non-prescribed thyroid medication.  he has never been on amiodarone.  He has intermitt slight palpitations in the chest, but no assoc tremor.  Pt says he cannot be isolated got RAI rx.    Past Medical History:  Diagnosis Date  . At risk for noncompliance   . CAD (coronary artery disease)    a. NSTEMI 10/2013 - DES to ramus intermedius.  b. 06/2014 Cath/PCI: LM nl, LAD 15p, RI patent stent prox, 6640m (2.5x12 Promus Premier DES), LCX 40p, EF 55-65%.  . Habitual alcohol use   . Hyperlipidemia LDL goal <70   . Hypertension   . Low TSH level   . MVP (mitral valve prolapse)   . Tobacco abuse     Past Surgical History:  Procedure Laterality Date  . CARDIAC CATHETERIZATION N/A 07/03/2014   Procedure: Left Heart Cath and Coronary Angiography;  Surgeon: Peter M SwazilandJordan, MD;  [facility];  Service: Cardiovascular;  Laterality: N/A;  . CARDIAC CATHETERIZATION N/A 07/03/2014   Procedure: Coronary Stent Intervention;  Surgeon: Peter M SwazilandJordan, MD;  [facility];  Service: Cardiovascular;  Laterality: N/A;  . CORONARY ANGIOPLASTY    . FINGER FRACTURE SURGERY Left 2014   4th finger  . LEFT HEART CATHETERIZATION WITH CORONARY ANGIOGRAM N/A 10/20/2013   Procedure: LEFT HEART CATHETERIZATION WITH CORONARY ANGIOGRAM;  Surgeon: Corky CraftsJayadeep S Varanasi, MD;  [facility];  Service: Cardiovascular;  Laterality: N/A;  . PERCUTANEOUS CORONARY STENT INTERVENTION (PCI-S)  07/03/2014   RAMUS      Social History   Socioeconomic History  . Marital status: Married    Spouse name: Not on file  .  Number of children: Not on file  . Years of education: Not on file  . Highest education level: Not on file  Occupational History  . Occupation: Works in Pharmacist, [employer]  Social Needs  . Financial resource strain: Not on file  . Food insecurity    Worry: Not on file    Inability: Not on file  . Transportation needs    Medical: Not on file    Non-medical: Not on file  Tobacco Use  . Smoking status: Light Tobacco Smoker    Packs/day: 0.75    Years: 30.00    Pack years: 22.50    Types: Cigarettes    Last attempt to quit: 07/03/2014    Years since quitting: 4.5  . Smokeless tobacco: Never Used  Substance and Sexual Activity  . Alcohol use: Yes  . Drug use: No  . Sexual activity: Not on file  Lifestyle  . Physical activity    Days per week: Not on file    Minutes per session: Not on file  . Stress: Not on file  Relationships  . Social Musicianconnections    Talks on phone: Not on file    Gets together: Not on file    Attends religious service: Not on file    Active member of club or organization: Not on file    Attends meetings of clubs or  organizations: Not on file    Relationship status: Not on file  . Intimate partner violence    Fear of current or ex partner: Not on file    Emotionally abused: Not on file    Physically abused: Not on file    Forced sexual activity: Not on file  Other Topics Concern  . Not on file  Social History Narrative   Lives with family    Current Outpatient Medications on File Prior to Visit  Medication Sig Dispense Refill  . aspirin EC 81 MG tablet Take 1 tablet (81 mg total) by mouth daily.    Marland Kitchen atorvastatin (LIPITOR) 80 MG tablet TAKE ONE TABLET BY MOUTH ONCE DAILY AT  6PM 90 tablet 3  . carvedilol (COREG) 6.25 MG tablet Take 1 tablet (6.25 mg total) by mouth 2 (two) times daily with a meal. 180 tablet 3  . hydrochlorothiazide (MICROZIDE) 12.5 MG capsule Take 1 capsule (12.5 mg total) by mouth daily. 90 capsule 3  . losartan (COZAAR) 100 MG tablet Take 1  tablet by mouth once daily 90 tablet 1  . nitroGLYCERIN (NITROSTAT) 0.4 MG SL tablet DISSOLVE ONE TABLET UNDER THE TONGUE EVERY 5 MINUTES AS NEEDED FOR CHEST PAIN.  DO NOT EXCEED A TOTAL OF 3 DOSES IN 15 MINUTES 25 tablet 3   No current facility-administered medications on file prior to visit.     Allergies  Allergen Reactions  . Ace Inhibitors Swelling    Lip swelling that resolved with benadryl, did not compromise his breathing    Family History  Problem Relation Age of Onset  . Heart attack Father   . Hypertension Father   . Stroke Father   . Thyroid disease Neg Hx     BP (!) 178/110 (BP Location: Left Arm, Patient Position: Sitting, Cuff Size: Normal)   Pulse 100   Ht 5\' 9"  (1.753 m)   Wt 154 lb 6.4 oz (70 kg)   SpO2 98%   BMI 22.80 kg/m   Review of Systems denies weight loss, headache, hoarseness, diplopia, sob, diarrhea, polyuria, muscle weakness, edema, excessive diaphoresis, anxiety, heat intolerance, easy bruising, and rhinorrhea.       Objective:   Physical Exam VS: see vs page GEN: no distress HEAD: head: no deformity eyes: no periorbital swelling, no proptosis external nose and ears are normal NECK: right thyroid lobe is slightly enlarged.   CHEST WALL: no deformity.   LUNGS: clear to auscultation.   CV: reg rate and rhythm, no murmur.   ABD: abdomen is soft, nontender.  no hepatosplenomegaly.  not distended.  no hernia MUSCULOSKELETAL: muscle bulk and strength are grossly normal.  no obvious joint swelling.  Left knee is in a brace.  gait is steady, but he favors LLE EXTEMITIES: no deformity.  no leg edema PULSES: no carotid bruit NEURO:  cn 2-12 grossly intact.   readily moves all 4's.  sensation is intact to touch on all 4's.  No tremor SKIN:  Normal texture and temperature.  No rash or suspicious lesion is visible.  Not diaphoretic NODES:  None palpable at the neck PSYCH: alert, well-oriented.  Does not appear anxious nor depressed.  Lab Results   Component Value Date   TSH 0.438 (L) 12/06/2018   T3TOTAL 146 12/27/2018   I have reviewed outside records, and summarized: Pt was noted to have low TSH, and referred here. Pt followed up with cardiol for HTN, after a long absence.       Assessment &  Plan:  Hyperthyroidism, new to me.  We discussed rx options. he chooses tapazole rx.  HTN: is noted today.  Patient Instructions  Your blood pressure is high today.  Please see your heart provider soon, to have it rechecked I have sent a prescription to your pharmacy, to slow the thyroid. If ever you have fever while taking methimazole, stop it and call us, even if the reason is obvious, because of the risk of a rare side-effect. Please come back for a follow-up appointment in 6 weeks.

## 2019-01-15 ENCOUNTER — Other Ambulatory Visit: Payer: Self-pay | Admitting: Rehabilitation

## 2019-01-15 DIAGNOSIS — M25562 Pain in left knee: Secondary | ICD-10-CM

## 2019-01-24 ENCOUNTER — Ambulatory Visit
Admission: RE | Admit: 2019-01-24 | Discharge: 2019-01-24 | Disposition: A | Discharge status: Home / Self Care | Payer: No Typology Code available for payment source | Source: Ambulatory Visit | Attending: Rehabilitation | Admitting: Rehabilitation

## 2019-01-24 ENCOUNTER — Other Ambulatory Visit: Payer: Self-pay

## 2019-01-24 DIAGNOSIS — M25562 Pain in left knee: Secondary | ICD-10-CM

## 2019-01-24 IMAGING — MR MR KNEE*L* W/O CM
4 of 6 series · 21 of 40 positions shown · non-contrast
Comparison: None.

CLINICAL DATA: Anterior knee pain with weakness for 4 weeks. No
acute injury or prior relevant surgery.

EXAM:
MRI OF THE LEFT KNEE WITHOUT CONTRAST
TECHNIQUE: Multiplanar, multisequence MR imaging of the knee was performed. No
intravenous contrast was administered.

[Series 3: T2 fat-sat · axial · 4.0mm · 0.50mm/px · z∈[-88,+7]mm · 3 of 24 slices shown (1 of 2)]
[im 5/24]
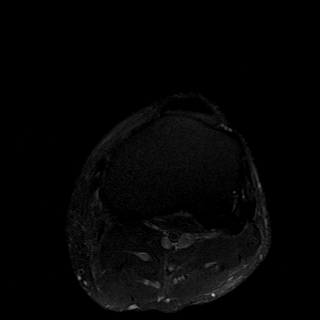
[im 14/24]
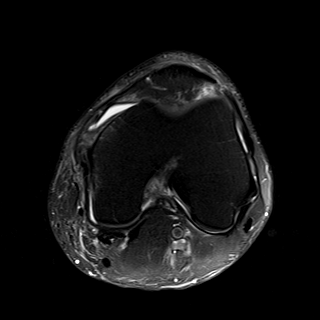
[im 24/24]
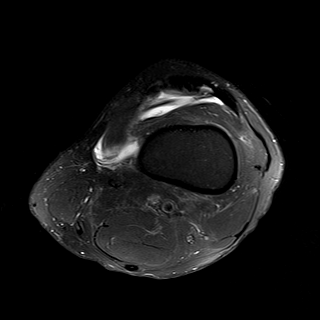

[Series 5: T2 fat-sat · coronal · 4.0mm · 0.29mm/px · 3 of 24 slices shown (2 of 2)]
[im 5/24]
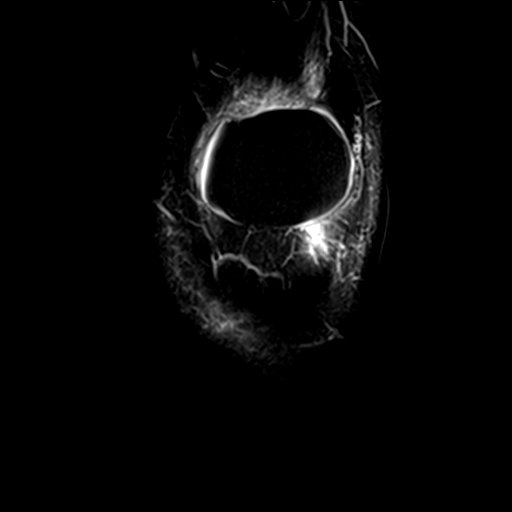
[im 14/24]
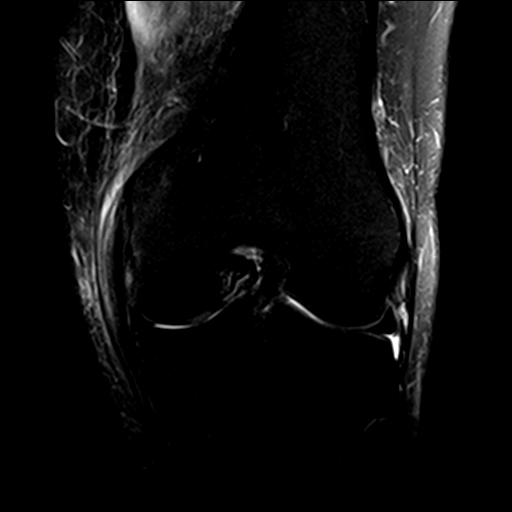
[im 24/24]
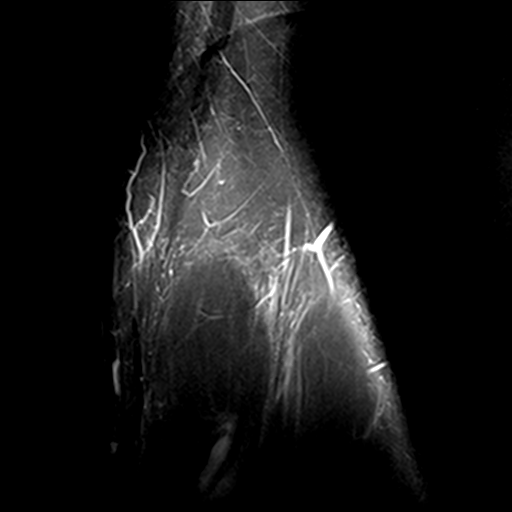

[Series 7: PD fat-sat · sagittal · 3.0mm · 0.29mm/px · 7 of 28 slices shown (1 of 2)]
[im 1/28]
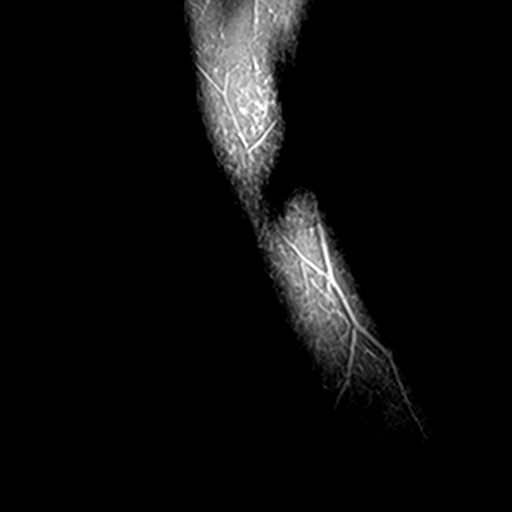
[im 5/28]
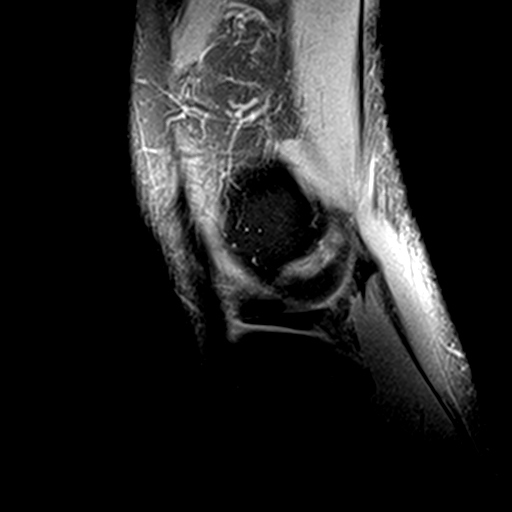
[im 10/28]
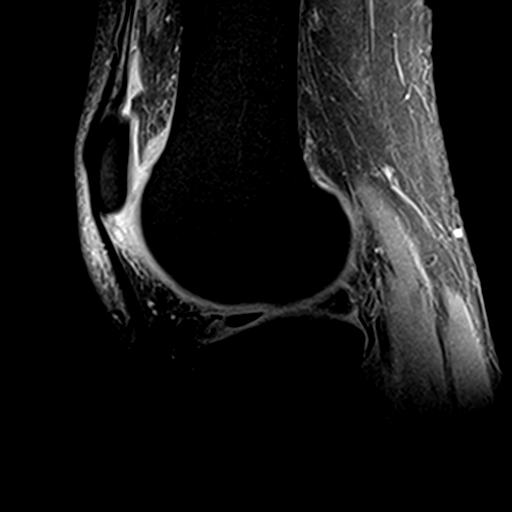
[im 14/28]
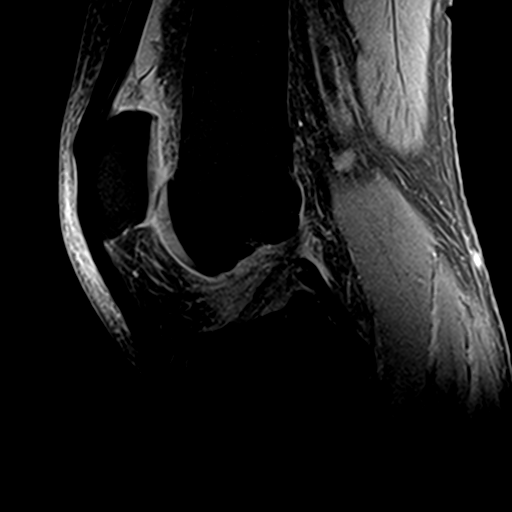
[im 19/28]
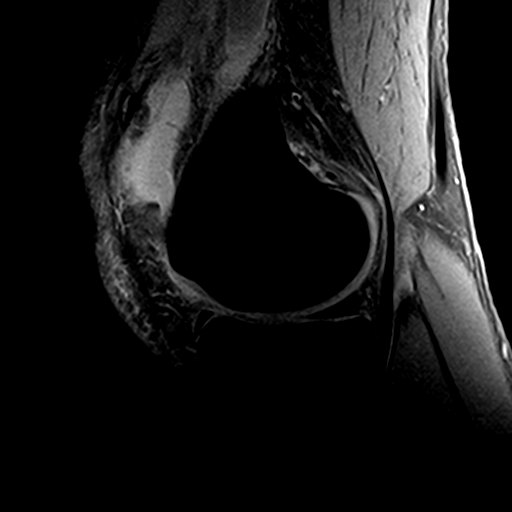
[im 23/28]
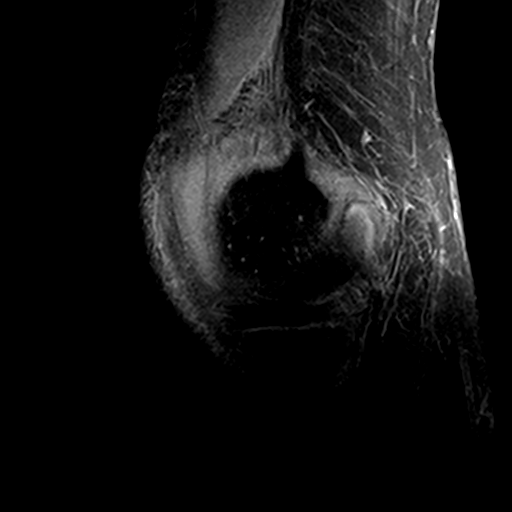
[im 28/28]
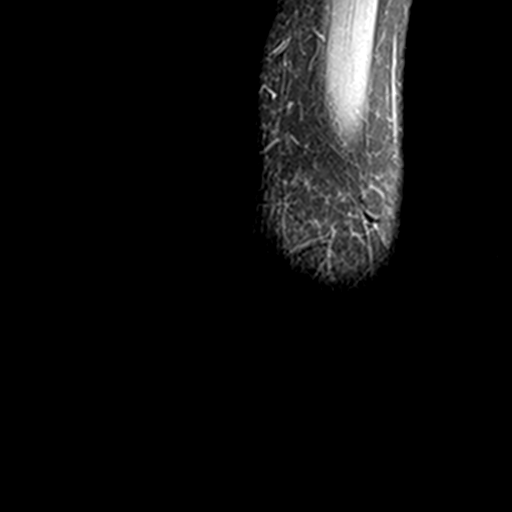

[Series 8: PD fat-sat · coronal · 3.0mm · 0.29mm/px · 8 of 32 slices shown (2 of 2)]
[im 1/32]
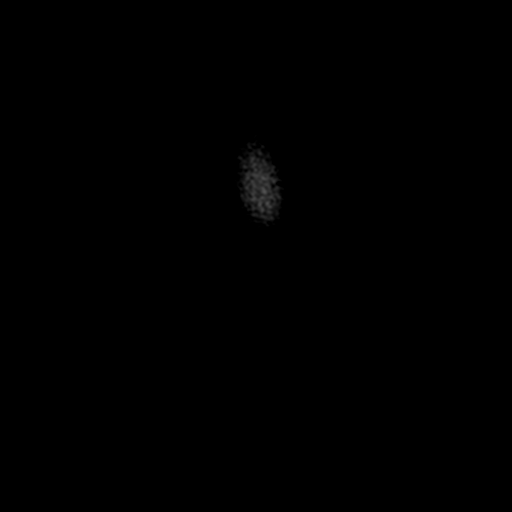
[im 5/32]
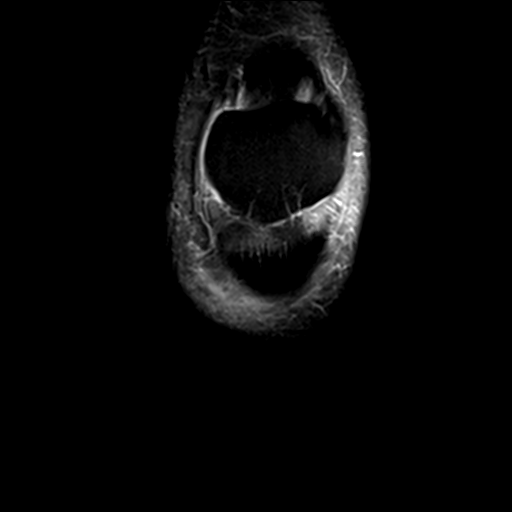
[im 9/32]
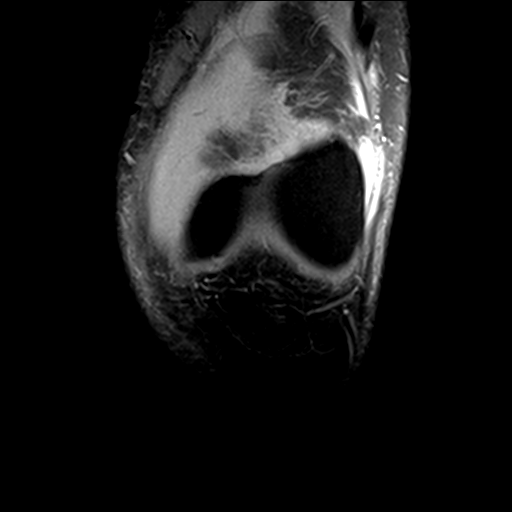
[im 14/32]
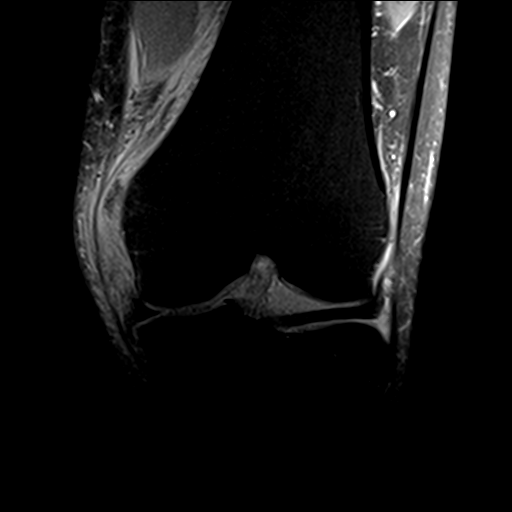
[im 18/32]
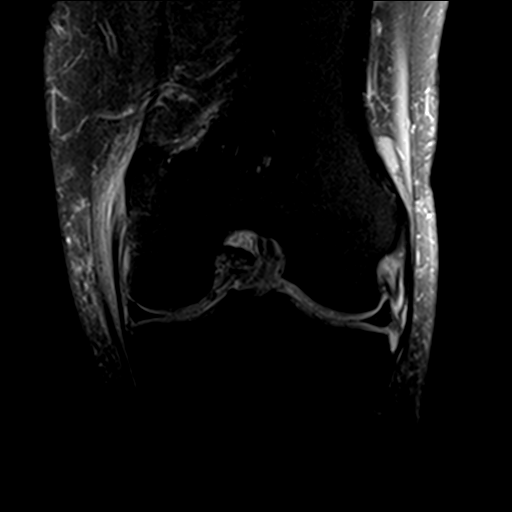
[im 23/32]
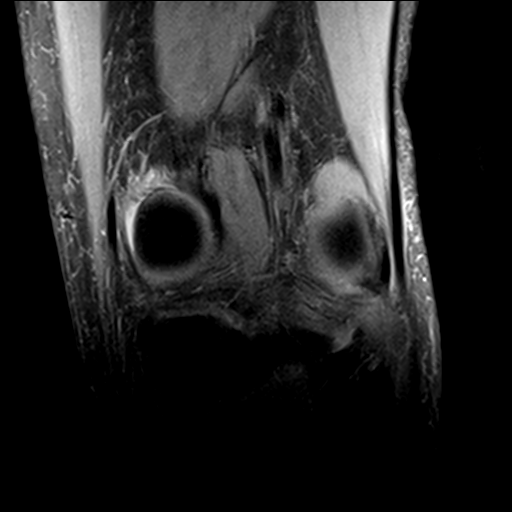
[im 27/32]
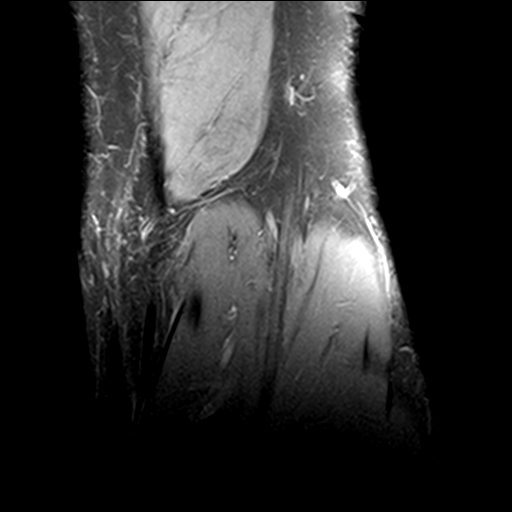
[im 32/32]
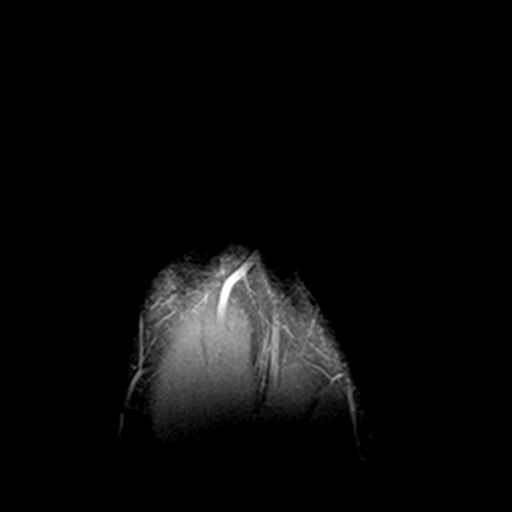

[21 of 40 positions shown; findings below may reference images not displayed]

FINDINGS: MENISCI

Medial meniscus:  Intact with normal morphology.

Lateral meniscus:  Intact with normal morphology.

LIGAMENTS

Cruciates:  Intact.

Collaterals: The medial collateral ligament is thickened with
overlying edema proximally. There is no full-thickness ligament tear
or retraction. The lateral collateral ligament complex appears
normal.

CARTILAGE

Patellofemoral:  Preserved.

Medial:  Preserved.

Lateral:  Preserved.

MISCELLANEOUS

Joint:  Moderate-sized joint effusion.

Popliteal Fossa:  Unremarkable. No significant Baker's cyst.

Extensor Mechanism:  Intact.

Bones: No acute or significant extra-articular osseous findings.
There is no subchondral edema or erosive change.

Other: Mild nonspecific soft tissue edema medially and superficial
to the patellar tendon. Edema tracks proximally along the vastus
medialis muscle. No focal fluid collections.
IMPRESSION: 1. Findings suggest a grade 2 MCL sprain with edema tracking
proximally along the vastus medialis muscle.
2. The menisci, cruciate and collateral ligaments are intact.
3. Moderate-sized joint effusion.
4. No acute osseous findings or significant arthropathic changes.

## 2019-02-08 ENCOUNTER — Other Ambulatory Visit: Payer: Self-pay | Admitting: Cardiology

## 2019-02-08 DIAGNOSIS — I10 Essential (primary) hypertension: Secondary | ICD-10-CM

## 2019-02-10 ENCOUNTER — Other Ambulatory Visit: Payer: Self-pay | Admitting: *Deleted

## 2019-02-10 DIAGNOSIS — I10 Essential (primary) hypertension: Secondary | ICD-10-CM

## 2019-02-10 MED ORDER — LOSARTAN POTASSIUM 100 MG PO TABS: 100.0000 mg | ORAL_TABLET | Freq: Every day | ORAL | 1 refills | Status: DC

## 2019-02-27 ENCOUNTER — Other Ambulatory Visit: Payer: Self-pay

## 2019-02-28 ENCOUNTER — Encounter: Payer: Self-pay | Admitting: Endocrinology

## 2019-02-28 ENCOUNTER — Ambulatory Visit (INDEPENDENT_AMBULATORY_CARE_PROVIDER_SITE_OTHER): Payer: No Typology Code available for payment source | Admitting: Endocrinology

## 2019-02-28 DIAGNOSIS — E0590 Thyrotoxicosis, unspecified without thyrotoxic crisis or storm: Secondary | ICD-10-CM | POA: Diagnosis not present

## 2019-02-28 NOTE — Patient Instructions (Addendum)
Blood tests are requested for you today.  We'll let you know about the results. Please call ahead, so the lab can expect you. If ever you have fever while taking methimazole, stop it and call us, even if the reason is obvious, because of the risk of a rare side-effect.  It is best to never miss the medication.  However, if you do miss it, next best is to double up the next time.   Please come back for a follow-up appointment in 2 months.

## 2019-02-28 NOTE — Progress Notes (Signed)
Subjective:    Patient ID: Joseph Carr, male    DOB: 12-04-1970, 49 y.o.   MRN: [id number]  HPI telehealth visit today via phone x 6 minutes Alternatives to telehealth are presented to this patient, and the patient agrees to the telehealth visit.   Pt is advised of the cost of the visit, and agrees to this, also.   Patient is at home, and I am at the office.   Persons attending the telehealth visit: the patient and I Pt returns for f/u of hyperthyroidism (dx'ed 2015; he chose tapazole rx. he has never had thyroid imaging).  SDOH: Pt says he cannot be isolated got RAI rx. Since on tapazole, pt states he feels well in general.  Specifically, she denies palpitations and tremor.   Past Medical History:  Diagnosis Date  . At risk for noncompliance   . CAD (coronary artery disease)    a. NSTEMI 10/2013 - DES to ramus intermedius.  b. 06/2014 Cath/PCI: LM nl, LAD 15p, RI patent stent prox, 24m (2.5x12 Promus Premier DES), LCX 40p, EF 55-65%.  . Habitual alcohol use   . Hyperlipidemia LDL goal <70   . Hypertension   . Low TSH level   . MVP (mitral valve prolapse)   . Tobacco abuse     Past Surgical History:  Procedure Laterality Date  . CARDIAC CATHETERIZATION N/A 07/03/2014   Procedure: Left Heart Cath and Coronary Angiography;  Surgeon: Peter M Swaziland, MD;  Location: [facility] INVASIVE CV LAB;  Service: Cardiovascular;  Laterality: N/A;  . CARDIAC CATHETERIZATION N/A 07/03/2014   Procedure: Coronary Stent Intervention;  Surgeon: Peter M Swaziland, MD;  Location: [facility] INVASIVE CV LAB;  Service: Cardiovascular;  Laterality: N/A;  . CORONARY ANGIOPLASTY    . FINGER FRACTURE SURGERY Left 2014   4th finger  . LEFT HEART CATHETERIZATION WITH CORONARY ANGIOGRAM N/A 10/20/2013   Procedure: LEFT HEART CATHETERIZATION WITH CORONARY ANGIOGRAM;  Surgeon: Corky Crafts, MD;  Location[facility] CATH LAB;  Service: Cardiovascular;  Laterality: N/A;  . PERCUTANEOUS CORONARY STENT INTERVENTION (PCI-S)  07/03/2014     RAMUS      Social History   Socioeconomic History  . Marital status: Married    Spouse name: Not on file  . Number of children: Not on file  . Years of education: Not on file  . Highest education level: Not on file  Occupational History  . Occupation: Works in IT trainer  . Smoking status: Light Tobacco Smoker    Packs/day: 0.75    Years: 30.00    Pack years: 22.50    Types: Cigarettes    Last attempt to quit: 07/03/2014    Years since quitting: 4.6  . Smokeless tobacco: Never Used  Substance and Sexual Activity  . Alcohol use: Yes  . Drug use: No  . Sexual activity: Not on file  Other Topics Concern  . Not on file  Social History Narrative   Lives with family   Social Determinants of Health   Financial Resource Strain:   . Difficulty of Paying Living Expenses: Not on file  Food Insecurity:   . Worried About Programme researcher, broadcasting/film/video in the Last Year: Not on file  . Ran Out of Food in the Last Year: Not on file  Transportation Needs:   . Lack of Transportation (Medical): Not on file  . Lack of Transportation (Non-Medical): Not on file  Physical Activity:   . Days of Exercise per Week: Not on file  .  Minutes of Exercise per Session: Not on file  Stress:   . Feeling of Stress : Not on file  Social Connections:   . Frequency of Communication with Friends and Family: Not on file  . Frequency of Social Gatherings with Friends and Family: Not on file  . Attends Religious Services: Not on file  . Active Member of Clubs or Organizations: Not on file  . Attends Archivist Meetings: Not on file  . Marital Status: Not on file  Intimate Partner Violence:   . Fear of Current or Ex-Partner: Not on file  . Emotionally Abused: Not on file  . Physically Abused: Not on file  . Sexually Abused: Not on file    Current Outpatient Medications on File Prior to Visit  Medication Sig Dispense Refill  . aspirin EC 81 MG tablet Take 1 tablet (81 mg total) by mouth  daily.    Marland Kitchen atorvastatin (LIPITOR) 80 MG tablet TAKE ONE TABLET BY MOUTH ONCE DAILY AT  6PM 90 tablet 3  . carvedilol (COREG) 6.25 MG tablet Take 1 tablet (6.25 mg total) by mouth 2 (two) times daily with a meal. 180 tablet 3  . hydrochlorothiazide (MICROZIDE) 12.5 MG capsule Take 1 capsule (12.5 mg total) by mouth daily. 90 capsule 3  . losartan (COZAAR) 100 MG tablet Take 1 tablet (100 mg total) by mouth daily. 90 tablet 1  . methimazole (TAPAZOLE) 5 MG tablet Take 0.5 tablets (2.5 mg total) by mouth daily. 15 tablet 5  . nitroGLYCERIN (NITROSTAT) 0.4 MG SL tablet DISSOLVE ONE TABLET UNDER THE TONGUE EVERY 5 MINUTES AS NEEDED FOR CHEST PAIN.  DO NOT EXCEED A TOTAL OF 3 DOSES IN 15 MINUTES 25 tablet 3   No current facility-administered medications on file prior to visit.    Allergies  Allergen Reactions  . Ace Inhibitors Swelling    Lip swelling that resolved with benadryl, did not compromise his breathing    Family History  Problem Relation Age of Onset  . Heart attack Father   . Hypertension Father   . Stroke Father   . Thyroid disease Neg Hx     There were no vitals taken for this visit.  Review of Systems Denies fever    Objective:   Physical Exam       Assessment & Plan:  Hyperthyroidism, due for recheck  Patient Instructions  Blood tests are requested for you today.  We'll let you know about the results. Please call ahead, so the lab can expect you. If ever you have fever while taking methimazole, stop it and call us, even if the reason is obvious, because of the risk of a rare side-effect.  It is best to never miss the medication.  However, if you do miss it, next best is to double up the next time.   Please come back for a follow-up appointment in 2 months.

## 2019-03-07 ENCOUNTER — Other Ambulatory Visit: Payer: No Typology Code available for payment source | Admitting: *Deleted

## 2019-03-07 ENCOUNTER — Other Ambulatory Visit: Payer: Self-pay

## 2019-03-07 DIAGNOSIS — Z79899 Other long term (current) drug therapy: Secondary | ICD-10-CM

## 2019-03-07 DIAGNOSIS — M25562 Pain in left knee: Secondary | ICD-10-CM | POA: Insufficient documentation

## 2019-03-07 DIAGNOSIS — I2510 Atherosclerotic heart disease of native coronary artery without angina pectoris: Secondary | ICD-10-CM

## 2019-03-07 DIAGNOSIS — E785 Hyperlipidemia, unspecified: Secondary | ICD-10-CM

## 2019-03-07 LAB — HEPATIC FUNCTION PANEL
ALT: 42 IU/L (ref 0–44)
AST: 49 IU/L — ABNORMAL HIGH (ref 0–40)
Albumin: 4.6 g/dL (ref 4.0–5.0)
Alkaline Phosphatase: 93 IU/L (ref 39–117)
Bilirubin Total: 0.4 mg/dL (ref 0.0–1.2)
Bilirubin, Direct: 0.16 mg/dL (ref 0.00–0.40)
Total Protein: 7.4 g/dL (ref 6.0–8.5)

## 2019-03-07 LAB — LIPID PANEL
Chol/HDL Ratio: 2.4 ratio (ref 0.0–5.0)
Cholesterol, Total: 200 mg/dL — ABNORMAL HIGH (ref 100–199)
HDL: 84 mg/dL (ref >39)
LDL Chol Calc (NIH): 97 mg/dL (ref 0–99)
Triglycerides: 110 mg/dL (ref 0–149)
VLDL Cholesterol Cal: 19 mg/dL (ref 5–40)

## 2019-03-10 ENCOUNTER — Telehealth: Payer: Self-pay | Admitting: *Deleted

## 2019-03-10 NOTE — Telephone Encounter (Signed)
-----   Message from Laurann Montana, PA-C sent at 03/07/2019  5:00 PM EST ----- Please call Joseph Carr - his LDL is actually a little higher than 11/2018 (85->97). Please inquire if he has been taking his cholesterol medicine atorvastatin faithfully. (He has history of running out of medication previously.) Has anything else changed in diet/activity? One of his liver function test remains mildly abnormal. This was abnormal even before statin was reinitiated. He may need additional lipid medication but I would like him to see PCP for further evaluation of this, to help guide whether they feel we can go up on his cholesterol medication or if he needs further liver workup -  alcohol may be contributing.  Dayna Dunn PA-C

## 2019-03-10 NOTE — Telephone Encounter (Signed)
error 

## 2019-04-24 ENCOUNTER — Other Ambulatory Visit: Payer: Self-pay

## 2019-04-28 ENCOUNTER — Other Ambulatory Visit: Payer: Self-pay

## 2019-04-28 ENCOUNTER — Ambulatory Visit: Payer: No Typology Code available for payment source | Admitting: Endocrinology

## 2019-04-28 ENCOUNTER — Encounter: Payer: Self-pay | Admitting: Endocrinology

## 2019-04-28 VITALS — BP 160/84 | HR 103 | Ht 69.0 in | Wt 146.0 lb

## 2019-04-28 DIAGNOSIS — E0590 Thyrotoxicosis, unspecified without thyrotoxic crisis or storm: Secondary | ICD-10-CM | POA: Diagnosis not present

## 2019-04-28 LAB — T4, FREE: Free T4: 0.66 ng/dL (ref 0.60–1.60)

## 2019-04-28 LAB — TSH: TSH: 0.57 u[IU]/mL (ref 0.35–4.50)

## 2019-04-28 MED ORDER — METHIMAZOLE 5 MG PO TABS: 5.0000 mg | ORAL_TABLET | Freq: Every day | ORAL | 1 refills | Status: DC

## 2019-04-28 NOTE — Patient Instructions (Signed)
Blood tests are requested for you today.  We'll let you know about the results. Please call ahead, so the lab can expect you. If ever you have fever while taking methimazole, stop it and call us, even if the reason is obvious, because of the risk of a rare side-effect.  It is best to never miss the medication.  However, if you do miss it, next best is to double up the next time.   Please come back for a follow-up appointment in 2 months.   

## 2019-04-28 NOTE — Progress Notes (Signed)
Subjective:    Patient ID: Joseph Carr, male    DOB: Jun 01, 1970, 49 y.o.   MRN: [id number]  HPI Pt returns for f/u of hyperthyroidism (dx'ed 2015; he chose tapazole rx. he has never had thyroid imaging).    SDOH: Pt says he cannot be isolated for RAI rx. Interval Hx: he takes tapazole 5 mg per day x 2 days, then 2.5 mg one day.  pt states he feels well in general.    Past Medical History:  Diagnosis Date  . At risk for noncompliance   . CAD (coronary artery disease)    a. NSTEMI 10/2013 - DES to ramus intermedius.  b. 06/2014 Cath/PCI: LM nl, LAD 15p, RI patent stent prox, 34m (2.5x12 Promus Premier DES), LCX 40p, EF 55-65%.  . Habitual alcohol use   . Hyperlipidemia LDL goal <70   . Hypertension   . Low TSH level   . MVP (mitral valve prolapse)   . Tobacco abuse     Past Surgical History:  Procedure Laterality Date  . CARDIAC CATHETERIZATION N/A 07/03/2014   Procedure: Left Heart Cath and Coronary Angiography;  Surgeon: Peter M Swaziland, MD;  Location: [facility] INVASIVE CV LAB;  Service: Cardiovascular;  Laterality: N/A;  . CARDIAC CATHETERIZATION N/A 07/03/2014   Procedure: Coronary Stent Intervention;  Surgeon: Peter M Swaziland, MD;  Location: [facility] INVASIVE CV LAB;  Service: Cardiovascular;  Laterality: N/A;  . CORONARY ANGIOPLASTY    . FINGER FRACTURE SURGERY Left 2014   4th finger  . LEFT HEART CATHETERIZATION WITH CORONARY ANGIOGRAM N/A 10/20/2013   Procedure: LEFT HEART CATHETERIZATION WITH CORONARY ANGIOGRAM;  Surgeon: Corky Crafts, MD;  Location: [facility] CATH LAB;  Service: Cardiovascular;  Laterality: N/A;  . PERCUTANEOUS CORONARY STENT INTERVENTION (PCI-S)  07/03/2014   RAMUS      Social History   Socioeconomic History  . Marital status: Married    Spouse name: Not on file  . Number of children: Not on file  . Years of education: Not on file  . Highest education level: Not on file  Occupational History  . Occupation: Works in IT trainer  . Smoking status:  Light Tobacco Smoker    Packs/day: 0.75    Years: 30.00    Pack years: 22.50    Types: Cigarettes    Last attempt to quit: 07/03/2014    Years since quitting: 4.8  . Smokeless tobacco: Never Used  Substance and Sexual Activity  . Alcohol use: Yes  . Drug use: No  . Sexual activity: Not on file  Other Topics Concern  . Not on file  Social History Narrative   Lives with family   Social Determinants of Health   Financial Resource Strain:   . Difficulty of Paying Living Expenses:   Food Insecurity:   . Worried About Programme researcher, broadcasting/film/video in the Last Year:   . Barista in the Last Year:   Transportation Needs:   . Freight forwarder (Medical):   Marland Kitchen Lack of Transportation (Non-Medical):   Physical Activity:   . Days of Exercise per Week:   . Minutes of Exercise per Session:   Stress:   . Feeling of Stress :   Social Connections:   . Frequency of Communication with Friends and Family:   . Frequency of Social Gatherings with Friends and Family:   . Attends Religious Services:   . Active Member of Clubs or Organizations:   . Attends Banker Meetings:   .  Marital Status:   Intimate Partner Violence:   . Fear of Current or Ex-Partner:   . Emotionally Abused:   Marland Kitchen Physically Abused:   . Sexually Abused:     Current Outpatient Medications on File Prior to Visit  Medication Sig Dispense Refill  . aspirin EC 81 MG tablet Take 1 tablet (81 mg total) by mouth daily.    Marland Kitchen atorvastatin (LIPITOR) 80 MG tablet TAKE ONE TABLET BY MOUTH ONCE DAILY AT  6PM 90 tablet 3  . carvedilol (COREG) 6.25 MG tablet Take 1 tablet (6.25 mg total) by mouth 2 (two) times daily with a meal. 180 tablet 3  . losartan (COZAAR) 100 MG tablet Take 1 tablet (100 mg total) by mouth daily. 90 tablet 1  . nitroGLYCERIN (NITROSTAT) 0.4 MG SL tablet DISSOLVE ONE TABLET UNDER THE TONGUE EVERY 5 MINUTES AS NEEDED FOR CHEST PAIN.  DO NOT EXCEED A TOTAL OF 3 DOSES IN 15 MINUTES 25 tablet 3  .  hydrochlorothiazide (MICROZIDE) 12.5 MG capsule Take 1 capsule (12.5 mg total) by mouth daily. 90 capsule 3   No current facility-administered medications on file prior to visit.    Allergies  Allergen Reactions  . Ace Inhibitors Swelling    Lip swelling that resolved with benadryl, did not compromise his breathing    Family History  Problem Relation Age of Onset  . Heart attack Father   . Hypertension Father   . Stroke Father   . Thyroid disease Neg Hx     BP (!) 160/84   Pulse (!) 103   Ht 5\' 9"  (1.753 m)   Wt 146 lb (66.2 kg)   SpO2 98%   BMI 21.56 kg/m   Review of Systems Denies fever    Objective:   Physical Exam VITAL SIGNS:  See vs page GENERAL: no distress NECK: thyroid is approx twice normal size (R>L)  Lab Results  Component Value Date   TSH 0.57 04/28/2019   T3TOTAL 146 12/27/2018      Assessment & Plan:  Hyperthyroidism: borderline control.  Increase to 5 mg qd Please come back for a follow-up appointment in 2 months.

## 2019-04-29 ENCOUNTER — Telehealth: Payer: Self-pay

## 2019-04-29 NOTE — Telephone Encounter (Signed)
-----   Message from Romero Belling, MD sent at 04/28/2019  4:48 PM EDT ----- please contact patient: Thyroid is borderline high.  Please take 5 mg per day.  I'll see you next time.

## 2019-04-29 NOTE — Telephone Encounter (Signed)
SECOND ATTEMPT: ° °LVM requesting returned call. °

## 2019-04-29 NOTE — Telephone Encounter (Signed)
LAB RESULTS  Lab results were reviewed by Dr. Everardo All. Called pt to inform about lab results as well as new orders. Unable to reach pt d/t no answer nor VM. Will continue efforts to reach pt

## 2019-04-30 NOTE — Telephone Encounter (Signed)
FINAL ATTEMPT:  LVM requesting returned call. Letter has also been mailed.

## 2019-06-13 ENCOUNTER — Other Ambulatory Visit: Payer: Self-pay

## 2019-06-13 ENCOUNTER — Ambulatory Visit: Payer: Self-pay

## 2019-06-13 ENCOUNTER — Other Ambulatory Visit: Payer: Self-pay | Admitting: Family Medicine

## 2019-06-13 DIAGNOSIS — M79644 Pain in right finger(s): Secondary | ICD-10-CM

## 2019-06-13 IMAGING — DX DG FINGER LITTLE 2+V*R*
3 series · 3 of 3 positions shown · non-contrast
Comparison: None.

CLINICAL DATA: The patient jammed his right little finger on a
piece of KUUNAA yesterday. Pain. Initial encounter.

EXAM:
RIGHT LITTLE FINGER 2+V

[finger pa]
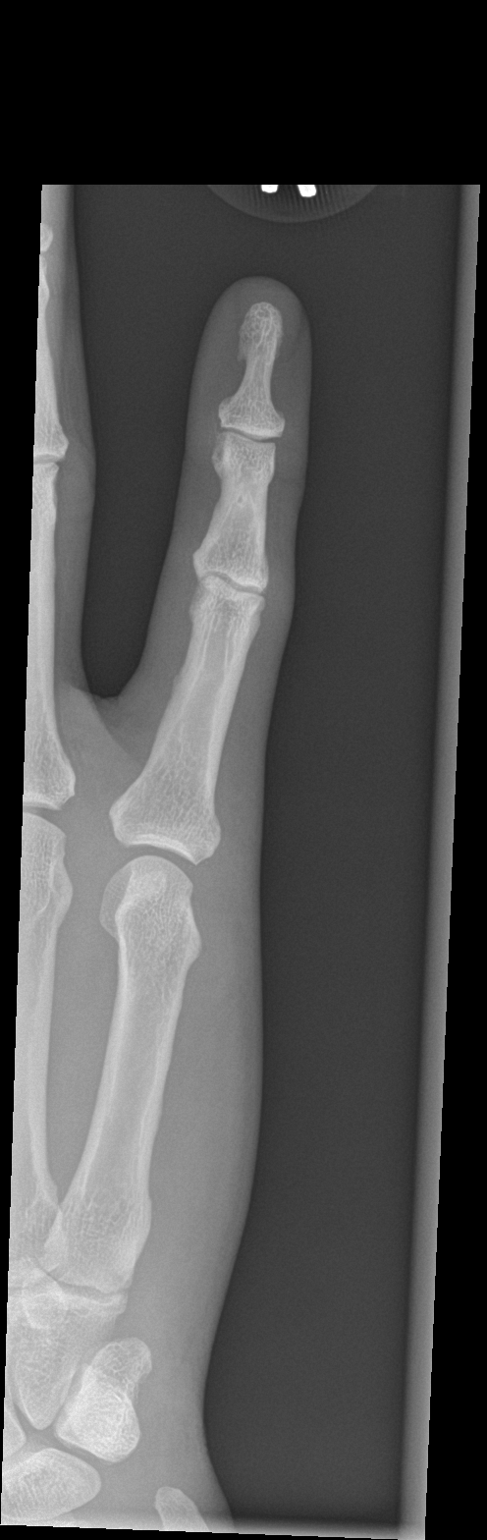

[finger obl]
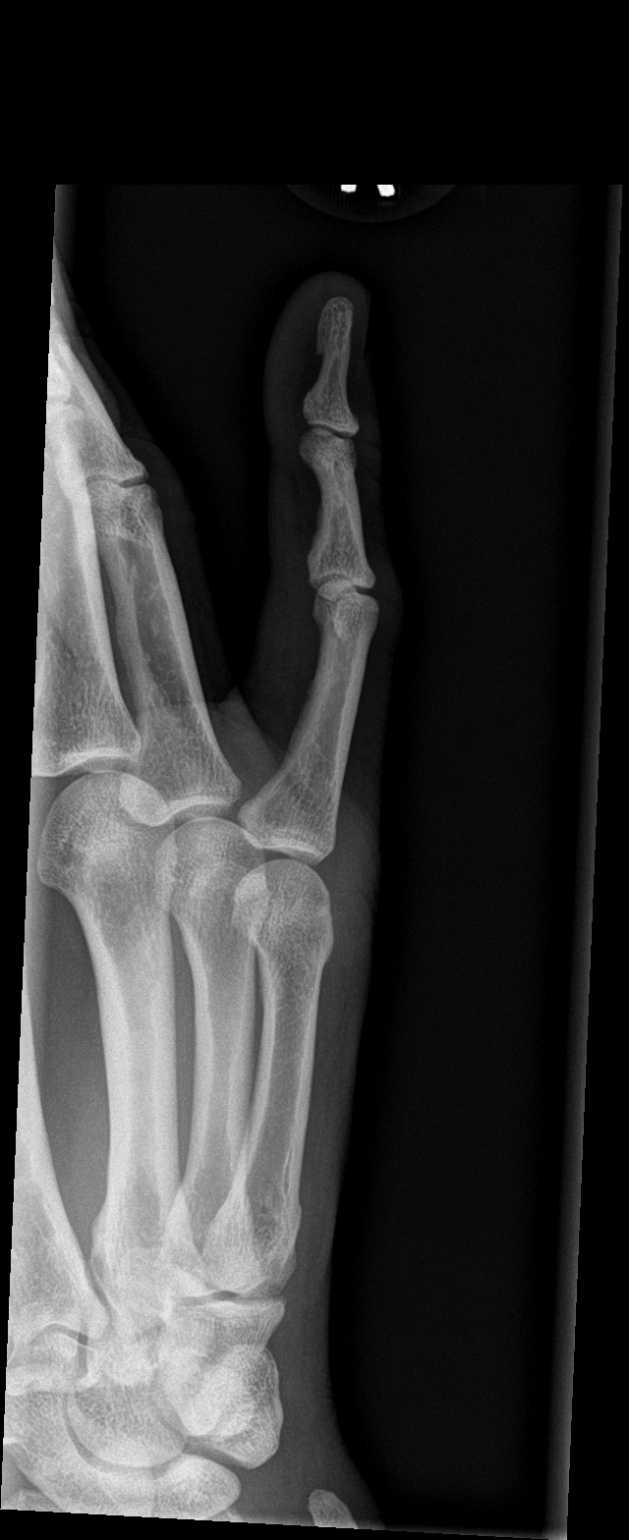

[finger lat]
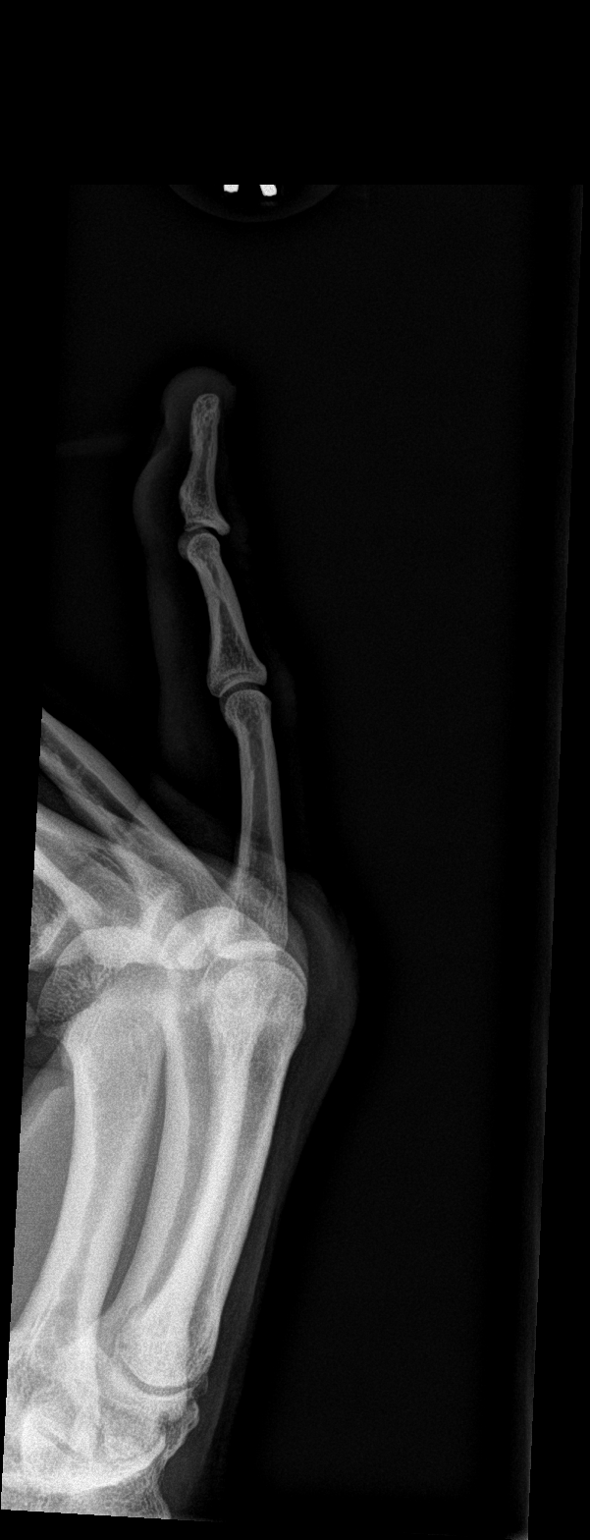

[3 of 3 positions shown; findings below may reference images not displayed]

FINDINGS: There is no evidence of fracture or dislocation. There is no
evidence of arthropathy or other focal bone abnormality. Soft
tissues are unremarkable.
IMPRESSION: Negative exam.

## 2019-07-04 ENCOUNTER — Ambulatory Visit: Payer: No Typology Code available for payment source | Admitting: Endocrinology

## 2019-08-07 ENCOUNTER — Telehealth: Payer: Self-pay

## 2019-08-07 NOTE — Telephone Encounter (Signed)
lmoed the pt to call us back and let us know if they are still taking it or not

## 2019-08-22 ENCOUNTER — Encounter: Payer: Self-pay | Admitting: Endocrinology

## 2019-08-22 ENCOUNTER — Ambulatory Visit: Payer: No Typology Code available for payment source | Admitting: Endocrinology

## 2019-08-22 ENCOUNTER — Other Ambulatory Visit: Payer: Self-pay

## 2019-08-22 VITALS — BP 156/78 | HR 101 | Ht 69.0 in | Wt 139.2 lb

## 2019-08-22 DIAGNOSIS — E0590 Thyrotoxicosis, unspecified without thyrotoxic crisis or storm: Secondary | ICD-10-CM

## 2019-08-22 LAB — CBC WITH DIFFERENTIAL/PLATELET
Basophils Absolute: 0 10*3/uL (ref 0.0–0.1)
Basophils Relative: 1.3 % (ref 0.0–3.0)
Eosinophils Absolute: 0.1 10*3/uL (ref 0.0–0.7)
Eosinophils Relative: 2.9 % (ref 0.0–5.0)
HCT: 37.7 % — ABNORMAL LOW (ref 39.0–52.0)
Hemoglobin: 12.5 g/dL — ABNORMAL LOW (ref 13.0–17.0)
Lymphocytes Relative: 35.9 % (ref 12.0–46.0)
Lymphs Abs: 1.3 10*3/uL (ref 0.7–4.0)
MCHC: 33.3 g/dL (ref 30.0–36.0)
MCV: 88.3 fl (ref 78.0–100.0)
Monocytes Absolute: 0.5 10*3/uL (ref 0.1–1.0)
Monocytes Relative: 13 % — ABNORMAL HIGH (ref 3.0–12.0)
Neutro Abs: 1.6 10*3/uL (ref 1.4–7.7)
Neutrophils Relative %: 46.9 % (ref 43.0–77.0)
Platelets: 273 10*3/uL (ref 150.0–400.0)
RBC: 4.27 Mil/uL (ref 4.22–5.81)
RDW: 14 % (ref 11.5–15.5)
WBC: 3.5 10*3/uL — ABNORMAL LOW (ref 4.0–10.5)

## 2019-08-22 LAB — T4, FREE: Free T4: 0.63 ng/dL (ref 0.60–1.60)

## 2019-08-22 LAB — TSH: TSH: 0.69 u[IU]/mL (ref 0.35–4.50)

## 2019-08-22 NOTE — Patient Instructions (Addendum)
Your blood pressure is high today.  Please see your primary care provider soon, to have it rechecked Blood tests are requested for you today.  We'll let you know about the results.  If ever you have fever while taking methimazole, stop it and call us, even if the reason is obvious, because of the risk of a rare side-effect.  It is best to never miss the medication.  However, if you do miss it, next best is to double up the next time.   You should see Dr Chales Salmon, about the fever.  Please come back for a follow-up appointment in 2 months.

## 2019-08-22 NOTE — Progress Notes (Signed)
Subjective:    Patient ID: Joseph Carr, male    DOB: [birth year], 49 y.o.   MRN: [id number]  HPI Pt returns for f/u of hyperthyroidism (dx'ed 2015; he chose tapazole rx. he has never had thyroid imaging).   SDOH: Pt says he cannot be isolated for RAI rx.   Interval Hx: he takes tapazole 5 mg per day, as rx'ed.  pt states he feels well in general.   Past Medical History:  Diagnosis Date  . At risk for noncompliance   . CAD (coronary artery disease)    a. NSTEMI 10/2013 - DES to ramus intermedius.  b. 06/2014 Cath/PCI: LM nl, LAD 15p, RI patent stent prox, 76m (2.5x12 Promus Premier DES), LCX 40p, EF 55-65%.  . Habitual alcohol use   . Hyperlipidemia LDL goal <70   . Hypertension   . Low TSH level   . MVP (mitral valve prolapse)   . Tobacco abuse     Past Surgical History:  Procedure Laterality Date  . CARDIAC CATHETERIZATION N/A 07/03/2014   Procedure: Left Heart Cath and Coronary Angiography;  Surgeon: Peter M Swaziland, MD;  Location: [facility] INVASIVE CV LAB;  Service: Cardiovascular;  Laterality: N/A;  . CARDIAC CATHETERIZATION N/A 07/03/2014   Procedure: Coronary Stent Intervention;  Surgeon: Peter M Swaziland, MD;  Location: [facility] INVASIVE CV LAB;  Service: Cardiovascular;  Laterality: N/A;  . CORONARY ANGIOPLASTY    . FINGER FRACTURE SURGERY Left 2014   4th finger  . LEFT HEART CATHETERIZATION WITH CORONARY ANGIOGRAM N/A 10/20/2013   Procedure: LEFT HEART CATHETERIZATION WITH CORONARY ANGIOGRAM;  Surgeon: Corky Crafts, MD;  Location: [facility] CATH LAB;  Service: Cardiovascular;  Laterality: N/A;  . PERCUTANEOUS CORONARY STENT INTERVENTION (PCI-S)  07/03/2014   RAMUS      Social History   Socioeconomic History  . Marital status: Married    Spouse name: Not on file  . Number of children: Not on file  . Years of education: Not on file  . Highest education level: Not on file  Occupational History  . Occupation: Works in IT trainer  . Smoking status: Light Tobacco Smoker     Packs/day: 0.75    Years: 30.00    Pack years: 22.50    Types: Cigarettes    Last attempt to quit: 07/03/2014    Years since quitting: 5.1  . Smokeless tobacco: Never Used  Vaping Use  . Vaping Use: Never used  Substance and Sexual Activity  . Alcohol use: Yes  . Drug use: No  . Sexual activity: Not on file  Other Topics Concern  . Not on file  Social History Narrative   Lives with family   Social Determinants of Health   Financial Resource Strain:   . Difficulty of Paying Living Expenses:   Food Insecurity:   . Worried About Programme researcher, broadcasting/film/video in the Last Year:   . Barista in the Last Year:   Transportation Needs:   . Freight forwarder (Medical):   Marland Kitchen Lack of Transportation (Non-Medical):   Physical Activity:   . Days of Exercise per Week:   . Minutes of Exercise per Session:   Stress:   . Feeling of Stress :   Social Connections:   . Frequency of Communication with Friends and Family:   . Frequency of Social Gatherings with Friends and Family:   . Attends Religious Services:   . Active Member of Clubs or Organizations:   . Attends Club  or Organization Meetings:   Marland Kitchen Marital Status:   Intimate Partner Violence:   . Fear of Current or Ex-Partner:   . Emotionally Abused:   Marland Kitchen Physically Abused:   . Sexually Abused:     Current Outpatient Medications on File Prior to Visit  Medication Sig Dispense Refill  . aspirin EC 81 MG tablet Take 1 tablet (81 mg total) by mouth daily.    Marland Kitchen atorvastatin (LIPITOR) 80 MG tablet TAKE ONE TABLET BY MOUTH ONCE DAILY AT  6PM 90 tablet 3  . carvedilol (COREG) 6.25 MG tablet Take 1 tablet (6.25 mg total) by mouth 2 (two) times daily with a meal. 180 tablet 3  . losartan (COZAAR) 100 MG tablet Take 1 tablet (100 mg total) by mouth daily. 90 tablet 1  . methimazole (TAPAZOLE) 5 MG tablet Take 1 tablet (5 mg total) by mouth daily. 90 tablet 1  . nitroGLYCERIN (NITROSTAT) 0.4 MG SL tablet DISSOLVE ONE TABLET UNDER THE TONGUE  EVERY 5 MINUTES AS NEEDED FOR CHEST PAIN.  DO NOT EXCEED A TOTAL OF 3 DOSES IN 15 MINUTES 25 tablet 3  . hydrochlorothiazide (MICROZIDE) 12.5 MG capsule Take 1 capsule (12.5 mg total) by mouth daily. 90 capsule 3   No current facility-administered medications on file prior to visit.    Allergies  Allergen Reactions  . Ace Inhibitors Swelling    Lip swelling that resolved with benadryl, did not compromise his breathing    Family History  Problem Relation Age of Onset  . Heart attack Father   . Hypertension Father   . Stroke Father   . Thyroid disease Neg Hx     BP (!) 156/78 (BP Location: Right Arm, Patient Position: Sitting, Cuff Size: Normal)   Pulse (!) 101   Ht 5\' 9"  (1.753 m)   Wt 139 lb 4 oz (63.2 kg)   SpO2 95%   BMI 20.56 kg/m    Review of Systems Denies fever    Objective:   Physical Exam VITAL SIGNS:  See vs page GENERAL: no distress NECK: There is no palpable thyroid enlargement.  No thyroid nodule is palpable.  No palpable lymphadenopathy at the anterior neck.    Lab Results  Component Value Date   TSH 0.69 08/22/2019   T3TOTAL 146 12/27/2018   Lab Results  Component Value Date   WBC 3.5 (L) 08/22/2019   HGB 12.5 (L) 08/22/2019   HCT 37.7 (L) 08/22/2019   MCV 88.3 08/22/2019   PLT 273.0 08/22/2019       Assessment & Plan:  Fever, new.  Check cbc.  Pt is advised to see primary care provider today, as it is Friday today.  Addendum: the elev temp is discovered to be a data entry error, as this is the HR, not temp. HTN: is noted today Hyperthyroidism: well-controlled.  Please continue the same medication Leukopenia, improved from 2020, so not due to tapazole. Anemia, new: pt is advised to f/u with primary care provider. Please come back for a follow-up appointment in 2 months.

## 2019-08-25 ENCOUNTER — Other Ambulatory Visit: Payer: Self-pay

## 2019-08-25 MED ORDER — METHIMAZOLE 5 MG PO TABS: 5.0000 mg | ORAL_TABLET | Freq: Every day | ORAL | 1 refills | Status: DC

## 2019-08-31 NOTE — Progress Notes (Signed)
Date:  09/01/2019   ID:  Joseph Carr, DOB 16-Oct-1970, MRN [id number]   PCP:  Lorenda Ishihara, MD  Cardiologist:  Armanda Magic, MD  Electrophysiologist:  None   Evaluation Performed:  Follow-Up Visit  Chief Complaint:  CAD, HTN, HLD  History of Present Illness:    Joseph Carr is a 49 y.o. male with CAD (s/p NSTEMI 10/2013 treated with DES to RI, repeat cath for chest pain 2016 s/p DES to ramus-2 just distal to prior stent), HTN, HLD, possible hyperthyroidism, tobacco abuse, habitual alcohol intake, and intermittent noncompliance.  His last cath was in 06/2014 at time of second PCI. This otherwise showed 40% prox Cx, 15% prox LAD, normal LVF. His Brilinta was previously switched to Plavix after completing Twilight study and was he continued on ASA. 2D echo 06/2015 showed normal EF 55-60%, mild mitral valve prolapse (myxomatous changes). Nuclear stress test 12/13/18 showed diaphragmatic attenuation but no ischemia, EF 56%, low risk study.   He is here today for followup and is doing well.  He denies any chest pain or pressure (except when working out in the yard mowing in the extreme heat), SOB, DOE, PND, orthopnea, LE edema, dizziness, palpitations or syncope. He is compliant with his meds and is tolerating meds with no SE.    Past Medical History:  Diagnosis Date  . At risk for noncompliance   . CAD (coronary artery disease)    a. NSTEMI 10/2013 - DES to ramus intermedius.  b. 06/2014 Cath/PCI: LM nl, LAD 15p, RI patent stent prox, 28m (2.5x12 Promus Premier DES), LCX 40p, EF 55-65%.  . Habitual alcohol use   . Hyperlipidemia LDL goal <70   . Hypertension   . Low TSH level   . MVP (mitral valve prolapse)   . Tobacco abuse    Past Surgical History:  Procedure Laterality Date  . CARDIAC CATHETERIZATION N/A 07/03/2014   Procedure: Left Heart Cath and Coronary Angiography;  Surgeon: Peter M Swaziland, MD;  Location: [facility] INVASIVE CV LAB;  Service: Cardiovascular;  Laterality: N/A;   . CARDIAC CATHETERIZATION N/A 07/03/2014   Procedure: Coronary Stent Intervention;  Surgeon: Peter M Swaziland, MD;  Location: [facility] INVASIVE CV LAB;  Service: Cardiovascular;  Laterality: N/A;  . CORONARY ANGIOPLASTY    . FINGER FRACTURE SURGERY Left 2014   4th finger  . LEFT HEART CATHETERIZATION WITH CORONARY ANGIOGRAM N/A 10/20/2013   Procedure: LEFT HEART CATHETERIZATION WITH CORONARY ANGIOGRAM;  Surgeon: Corky Crafts, MD;  Location: [facility] CATH LAB;  Service: Cardiovascular;  Laterality: N/A;  . PERCUTANEOUS CORONARY STENT INTERVENTION (PCI-S)  07/03/2014   RAMUS       No outpatient medications have been marked as taking for the 09/01/19 encounter (Appointment) with Quintella Reichert, MD.     Allergies:   Ace inhibitors   Social History   Tobacco Use  . Smoking status: Light Tobacco Smoker    Packs/day: 0.75    Years: 30.00    Pack years: 22.50    Types: Cigarettes    Last attempt to quit: 07/03/2014    Years since quitting: 5.1  . Smokeless tobacco: Never Used  Vaping Use  . Vaping Use: Never used  Substance Use Topics  . Alcohol use: Yes  . Drug use: No     Family Hx: The patient's family history includes Heart attack in his father; Hypertension in his father; Stroke in his father. There is no history of Thyroid disease.  ROS:   Please see the  history of present illness.    All other systems reviewed and are negative.   Prior CV studies:    Most recent pertinent cardiac studies are outlined above.  Labs/Other Tests and Data Reviewed:    EKG:  None  Recent Labs: 12/27/2018: BUN 15; Creatinine, Ser 1.14; Potassium 4.8; Sodium 143 03/07/2019: ALT 42 08/22/2019: Hemoglobin 12.5; Platelets 273.0; TSH 0.69   Recent Lipid Panel Lab Results  Component Value Date/Time   CHOL 200 (H) 03/07/2019 11:02 AM   TRIG 110 03/07/2019 11:02 AM   HDL 84 03/07/2019 11:02 AM   CHOLHDL 2.4 03/07/2019 11:02 AM   CHOLHDL 4.0 02/12/2015 12:57 PM   LDLCALC 97 03/07/2019 11:02 AM     Wt Readings from Last 3 Encounters:  08/22/19 139 lb 4 oz (63.2 kg)  04/28/19 146 lb (66.2 kg)  01/08/19 154 lb 6.4 oz (70 kg)     Objective:    Vital Signs:  There were no vitals taken for this visit.   VGEN: Well nourished, well developed in no acute distress HEENT: Normal NECK: No JVD; No carotid bruits LYMPHATICS: No lymphadenopathy CARDIAC:RRR, no murmurs, rubs, gallops RESPIRATORY:  Clear to auscultation without rales, wheezing or rhonchi  ABDOMEN: Soft, non-tender, non-distended MUSCULOSKELETAL:  No edema; No deformity  SKIN: Warm and dry NEUROLOGIC:  Alert and oriented x 3 PSYCHIATRIC:  Normal affect    ASSESSMENT & PLAN:    1. ASCAD with chronic atypical CP -Nuclear stress test with no ischemia 12/2018   -he occasionally may have some mild CP when working out in the heat in his yard -Continue ASA and statin -continue to encouraged ETOH cessation -encouraged him to try to cut out tobacco all together  2. Essential HTN  -BP poorly controlled on exam but he ran out of Losartan, carvedilol for the past 3 weeks -continue HCTZ 12.5mg  daily, Carvedilol 6.25mg  BID and Losartan 100mg  daily -refilled carvedilol and Losartan today -I have asked him to check his BP daily for a week and call with results after starting back on his BP meds  3. Hyperlipidemia -LDL goal < 70 -LDL was 97 in Jan 2021 -continue Atorvastatin 80mg  daily -repeat FLP and cmet  4.  Mitral valve prolapse  -no echo since 2017 so will repeat   Medication Adjustments/Labs and Tests Ordered: Current medicines are reviewed at length with the patient today.  Concerns regarding medicines are outlined above.   Disposition:  Follow up in 6 months with Dr. .  Signed, 2018, MD  09/01/2019 2:06 PM     [facility]

## 2019-09-01 ENCOUNTER — Ambulatory Visit (INDEPENDENT_AMBULATORY_CARE_PROVIDER_SITE_OTHER): Payer: No Typology Code available for payment source | Admitting: Cardiology

## 2019-09-01 ENCOUNTER — Encounter: Payer: Self-pay | Admitting: Cardiology

## 2019-09-01 ENCOUNTER — Other Ambulatory Visit: Payer: Self-pay

## 2019-09-01 VITALS — BP 176/98 | HR 94 | Ht 69.0 in | Wt 136.8 lb

## 2019-09-01 DIAGNOSIS — I341 Nonrheumatic mitral (valve) prolapse: Secondary | ICD-10-CM | POA: Diagnosis not present

## 2019-09-01 DIAGNOSIS — I10 Essential (primary) hypertension: Secondary | ICD-10-CM

## 2019-09-01 DIAGNOSIS — I2510 Atherosclerotic heart disease of native coronary artery without angina pectoris: Secondary | ICD-10-CM | POA: Diagnosis not present

## 2019-09-01 DIAGNOSIS — E785 Hyperlipidemia, unspecified: Secondary | ICD-10-CM | POA: Diagnosis not present

## 2019-09-01 MED ORDER — CARVEDILOL 6.25 MG PO TABS: 6.2500 mg | ORAL_TABLET | Freq: Two times a day (BID) | ORAL | 3 refills | Status: DC

## 2019-09-01 MED ORDER — LOSARTAN POTASSIUM 100 MG PO TABS: 100.0000 mg | ORAL_TABLET | Freq: Every day | ORAL | 3 refills | Status: DC

## 2019-09-01 NOTE — Addendum Note (Signed)
Addended by: Theresia Majors on: 09/01/2019 02:34 PM   Modules accepted: Orders

## 2019-09-01 NOTE — Patient Instructions (Signed)
Medication Instructions:  Your physician recommends that you continue on your current medications as directed. Please refer to the Current Medication list given to you today.  *If you need a refill on your cardiac medications before your next appointment, please call your pharmacy*   Lab Work: Fasting lipids and CMET If you have labs (blood work) drawn today and your tests are completely normal, you will receive your results only by: Marland Kitchen MyChart Message (if you have MyChart) OR . A paper copy in the mail If you have any lab test that is abnormal or we need to change your treatment, we will call you to review the results.   Testing/Procedures: Your physician has requested that you have an echocardiogram. Echocardiography is a painless test that uses sound waves to create images of your heart. It provides your doctor with information about the size and shape of your heart and how well your heart's chambers and valves are working. This procedure takes approximately one hour. There are no restrictions for this procedure.  Follow-Up: At [facility], you and your health needs are our priority.  As part of our continuing mission to provide you with exceptional heart care, we have created designated Provider Care Teams.  These Care Teams include your primary Cardiologist (physician) and Advanced Practice Providers (APPs -  Physician Assistants and Nurse Practitioners) who all work together to provide you with the care you need, when you need it.  We recommend signing up for the patient portal called "MyChart".  Sign up information is provided on this After Visit Summary.  MyChart is used to connect with patients for Virtual Visits (Telemedicine).  Patients are able to view lab/test results, encounter notes, upcoming appointments, etc.  Non-urgent messages can be sent to your provider as well.   To learn more about what you can do with MyChart, go to [email or web address].    Your next appointment:    6 month(s)  The format for your next appointment:   In Person  Provider:   You may see Armanda Magic, MD or one of the following Advanced Practice Providers on your designated Care Team:    Ronie Spies, PA-C  Jacolyn Reedy, PA-C

## 2019-09-16 ENCOUNTER — Telehealth: Payer: Self-pay | Admitting: Cardiology

## 2019-09-16 DIAGNOSIS — I10 Essential (primary) hypertension: Secondary | ICD-10-CM

## 2019-09-16 NOTE — Telephone Encounter (Signed)
Please verify that patient is taking all BP meds with no missed doses

## 2019-09-16 NOTE — Telephone Encounter (Signed)
Left message to call office

## 2019-09-16 NOTE — Telephone Encounter (Signed)
  Patient called answering service and left his BP readings that he was asked to take and call with results:  8/2   136/87 8/3   147/95 8/4   128/90 8/5   118/70 8/6   140/93

## 2019-09-16 NOTE — Telephone Encounter (Signed)
Dr. Norris Cross recent note indicates she asked the patient for BP readings, pending her review

## 2019-09-17 NOTE — Telephone Encounter (Signed)
Spoke with the patient who reports that he has been taking all of his BP medications as prescribed with no missed doses.

## 2019-09-17 NOTE — Telephone Encounter (Signed)
Change HCTZ to chlorthalidone 25mg  daily and check BMET in 1 week.  Have him check his BP daily for a week and call with results

## 2019-09-17 NOTE — Telephone Encounter (Signed)
Left message for patient to call back  

## 2019-09-17 NOTE — Telephone Encounter (Signed)
Patient returning call.

## 2019-09-18 ENCOUNTER — Telehealth: Payer: Self-pay | Admitting: Cardiology

## 2019-09-18 MED ORDER — CHLORTHALIDONE 25 MG PO TABS
25.0000 mg | ORAL_TABLET | Freq: Every day | ORAL | 3 refills | Status: DC
Start: 2019-09-18 — End: 2020-11-16

## 2019-09-18 NOTE — Telephone Encounter (Signed)
Spoke with the patient and he will discontinue HCTZ. I have sent in Rx for chlorthalidone 25 mg daily. He will continue taking BP daily and let us know readings in one week. He will come in for BMET on 08/20.

## 2019-09-18 NOTE — Telephone Encounter (Signed)
Left message for patient to call back  

## 2019-09-18 NOTE — Telephone Encounter (Signed)
Transferred call to [place]. 

## 2019-09-19 ENCOUNTER — Other Ambulatory Visit: Payer: Self-pay

## 2019-09-19 ENCOUNTER — Ambulatory Visit (HOSPITAL_COMMUNITY): Payer: No Typology Code available for payment source | Attending: Cardiology

## 2019-09-19 ENCOUNTER — Other Ambulatory Visit: Payer: No Typology Code available for payment source | Admitting: *Deleted

## 2019-09-19 DIAGNOSIS — E785 Hyperlipidemia, unspecified: Secondary | ICD-10-CM | POA: Diagnosis not present

## 2019-09-19 DIAGNOSIS — I341 Nonrheumatic mitral (valve) prolapse: Secondary | ICD-10-CM

## 2019-09-19 DIAGNOSIS — I2510 Atherosclerotic heart disease of native coronary artery without angina pectoris: Secondary | ICD-10-CM

## 2019-09-19 DIAGNOSIS — I10 Essential (primary) hypertension: Secondary | ICD-10-CM

## 2019-09-19 LAB — ECHOCARDIOGRAM COMPLETE
Area-P 1/2: 2.68 cm2
S' Lateral: 2.4 cm

## 2019-09-20 LAB — COMPREHENSIVE METABOLIC PANEL
ALT: 45 IU/L — ABNORMAL HIGH (ref 0–44)
AST: 51 IU/L — ABNORMAL HIGH (ref 0–40)
Albumin/Globulin Ratio: 1.8 (ref 1.2–2.2)
Albumin: 4.8 g/dL (ref 4.0–5.0)
Alkaline Phosphatase: 78 IU/L (ref 48–121)
BUN/Creatinine Ratio: 13 (ref 9–20)
BUN: 14 mg/dL (ref 6–24)
Bilirubin Total: 0.5 mg/dL (ref 0.0–1.2)
CO2: 23 mmol/L (ref 20–29)
Calcium: 9.7 mg/dL (ref 8.7–10.2)
Chloride: 105 mmol/L (ref 96–106)
Creatinine, Ser: 1.06 mg/dL (ref 0.76–1.27)
GFR calc Af Amer: 94 mL/min/{1.73_m2} (ref >59)
GFR calc non Af Amer: 81 mL/min/{1.73_m2} (ref >59)
Globulin, Total: 2.7 g/dL (ref 1.5–4.5)
Glucose: 81 mg/dL (ref 65–99)
Potassium: 3.8 mmol/L (ref 3.5–5.2)
Sodium: 145 mmol/L — ABNORMAL HIGH (ref 134–144)
Total Protein: 7.5 g/dL (ref 6.0–8.5)

## 2019-09-20 LAB — LIPID PANEL
Chol/HDL Ratio: 2.6 ratio (ref 0.0–5.0)
Cholesterol, Total: 212 mg/dL — ABNORMAL HIGH (ref 100–199)
HDL: 82 mg/dL (ref >39)
LDL Chol Calc (NIH): 109 mg/dL — ABNORMAL HIGH (ref 0–99)
Triglycerides: 121 mg/dL (ref 0–149)
VLDL Cholesterol Cal: 21 mg/dL (ref 5–40)

## 2019-09-23 ENCOUNTER — Telehealth: Payer: Self-pay

## 2019-09-23 ENCOUNTER — Telehealth: Payer: Self-pay | Admitting: Cardiology

## 2019-09-23 DIAGNOSIS — E785 Hyperlipidemia, unspecified: Secondary | ICD-10-CM

## 2019-09-23 DIAGNOSIS — I517 Cardiomegaly: Secondary | ICD-10-CM

## 2019-09-23 NOTE — Telephone Encounter (Signed)
The patient has been notified of the result and verbalized understanding.  All questions (if any) were answered. Theresia Majors, RN 09/23/2019 5:08 PM

## 2019-09-23 NOTE — Telephone Encounter (Signed)
Quintella Reichert, MD  Theresia Majors, RN Cc: Lorenda Ishihara, MD Stable labs - continue current meds and forward to PCP. LDL is not at goal and LFTs mildly elevated>>refer to lipid clinic        The patient has been notified of the result and verbalized understanding.  All questions (if any) were answered. Theresia Majors, RN 09/23/2019 4:42 PM

## 2019-09-23 NOTE — Telephone Encounter (Signed)
-----   Message from Quintella Reichert, MD sent at 09/20/2019 12:48 PM EDT ----- Echo showed normal heart funciton with moderately thickened heart muscle and increased stiffness of heart muscle.  Trivial leakiness of MV. His moderate LVH is likely related to HTN but need to rule out HOCM.  Please get a cardiac MRI

## 2019-09-23 NOTE — Telephone Encounter (Signed)
Patient is returning call to discuss results from lab work completed on 0813/21.  

## 2019-09-25 ENCOUNTER — Encounter: Payer: Self-pay | Admitting: Cardiology

## 2019-09-25 ENCOUNTER — Telehealth: Payer: Self-pay | Admitting: Cardiology

## 2019-09-25 NOTE — Telephone Encounter (Signed)
Left message for patient to call and discuss appointment for Cardiac MRI ordered by Dr. Mayford Knife

## 2019-09-25 NOTE — Telephone Encounter (Signed)
Spoke with patient regarding appointment for Cardiac MRI scheduled Friday 11/07/19 at 8:00 am at [facility]---arrival time is 7:30am 1st floor admissions office---will mail information to patient.  He voiced his understanding.

## 2019-09-30 ENCOUNTER — Telehealth: Payer: Self-pay | Admitting: Cardiology

## 2019-09-30 NOTE — Telephone Encounter (Signed)
Patient's BP Readings: Earliest to Latest  133/89 112/65 133/83 120/74 113/76

## 2019-10-01 NOTE — Telephone Encounter (Signed)
Left message for patient letting him know that Dr. Mayford Knife said his BP has improved after medication changes and to call back with any questions.

## 2019-10-01 NOTE — Telephone Encounter (Signed)
BP improved after the change

## 2019-10-22 ENCOUNTER — Telehealth: Payer: Self-pay

## 2019-10-22 ENCOUNTER — Other Ambulatory Visit: Payer: Self-pay

## 2019-10-22 ENCOUNTER — Ambulatory Visit (INDEPENDENT_AMBULATORY_CARE_PROVIDER_SITE_OTHER): Payer: No Typology Code available for payment source | Admitting: Pharmacist

## 2019-10-22 VITALS — BP 158/88 | HR 93 | Ht 69.0 in | Wt 139.4 lb

## 2019-10-22 DIAGNOSIS — E785 Hyperlipidemia, unspecified: Secondary | ICD-10-CM | POA: Diagnosis not present

## 2019-10-22 DIAGNOSIS — I10 Essential (primary) hypertension: Secondary | ICD-10-CM | POA: Diagnosis not present

## 2019-10-22 MED ORDER — REPATHA SURECLICK 140 MG/ML ~~LOC~~ SOAJ: 140.0000 mg | SUBCUTANEOUS | 11 refills | Status: DC

## 2019-10-22 NOTE — Patient Instructions (Addendum)
Your Results:             Your most recent labs Goal  Total Cholesterol 212 < 200  Triglycerides 121 < 150  HDL (good cholesterol) 82 > 40  LDL (bad cholesterol 109 < 70  (prefer <55)   Medication changes: *START taking Repatha 140mg  every 14 days*  Lab orders: *Repeat fasting blood work  Clinic phone number: /[place] [phone]   Thank you for choosing [facility]

## 2019-10-22 NOTE — Telephone Encounter (Signed)
Called and lmomed the pt to start repatha, rx sent, pt instructed to sign up for the copay card and to call us if issues arise

## 2019-10-22 NOTE — Progress Notes (Signed)
Patient ID: [name]                 DOB: 1970/05/02                    MRN: [id number]     HPI: Joseph Carr is a 49 y.o. male patient referred to lipid clinic by Dr Mayford Knife. PMH is significant for CAD s/p NSTEMI , hypertension, hyperlipidemia, hyperthyroidism, and tobacco abuse. Joseph Carr used to be on Praluent therapy and can not recall reason to stop therapy. He denies intolerance to PCSK9i or side effects.  Current Medications:  Atorvastatin 80mg  daily  Current antihypertensive medication: Carvedilol 6.25mg  twice daily Chlorthalidone 25mg  daily Losartan 100mg  daily  Intolerances:    LDL goal: < 88m/dL   Diet: try to et healthy, low fat and sodium  Family History: The patient's family history includes Heart attack in his father; Hypertension in his father; Stroke in his father. There is no history of Thyroid disease.  Social History: current tobacco use, chronic alcohol use, stopped drinking hard liquor and cut down on amount of alcohol  Labs: 09/19/19: CHO 212, TG 121, HDL 82, LDL-c 109 (on atorvastatin 80mg  daily)  Past Medical History:  Diagnosis Date  . At risk for noncompliance   . CAD (coronary artery disease)    a. NSTEMI 10/2013 - DES to ramus intermedius.  b. 06/2014 Cath/PCI: LM nl, LAD 15p, RI patent stent prox, 19m (2.5x12 Promus Premier DES), LCX 40p, EF 55-65%.  . Habitual alcohol use   . Hyperlipidemia LDL goal <70   . Hypertension   . Low TSH level   . MVP (mitral valve prolapse)   . Tobacco abuse     Current Outpatient Medications on File Prior to Visit  Medication Sig Dispense Refill  . aspirin EC 81 MG tablet Take 1 tablet (81 mg total) by mouth daily.    atorvastatin (LIPITOR) 80 MG tablet TAKE ONE TABLET BY MOUTH ONCE DAILY AT  6PM 90 tablet 3  . carvedilol (COREG) 6.25 MG tablet Take 1 tablet (6.25 mg total) by mouth 2 (two) times daily with a meal. 180 tablet 3  . chlorthalidone (HYGROTON) 25 MG tablet Take 1 tablet (25 mg total) by  mouth daily. 90 tablet 3  . losartan (COZAAR) 100 MG tablet Take 1 tablet (100 mg total) by mouth daily. 90 tablet 3  . methimazole (TAPAZOLE) 5 MG tablet Take 1 tablet (5 mg total) by mouth daily. 90 tablet 1  . nitroGLYCERIN (NITROSTAT) 0.4 MG SL tablet DISSOLVE ONE TABLET UNDER THE TONGUE EVERY 5 MINUTES AS NEEDED FOR CHEST PAIN.  DO NOT EXCEED A TOTAL OF 3 DOSES IN 15 MINUTES 25 tablet 3   No current facility-administered medications on file prior to visit.    Allergies  Allergen Reactions  . Ace Inhibitors Swelling    Lip swelling that resolved with benadryl, did not compromise his breathing    Essential hypertension BP remains above goal today, but patient reports anxiety and stress coming to the office today. Noted BP today is better than last reading during OV. Will continue current therapy as prescribed, and monitor BP daily. Plan to follow up by phone in 2 weeks and adjust therapy during follow up if needed.   Hyperlipidemia LDL goal <70 LDL is above goal for secondary prevention. Noted patient tolerated PCSK9i therapy in the past and stopped therapy for unknown reasons. Will resume PCSK9i therapy and follow up fasting blood work  after 4th dose.    Rodriguez-Guzman PharmD, BCPS, CPP [facility] [address] [place] [address] 10/26/2019 7:00 PM

## 2019-10-24 ENCOUNTER — Other Ambulatory Visit (INDEPENDENT_AMBULATORY_CARE_PROVIDER_SITE_OTHER): Payer: No Typology Code available for payment source

## 2019-10-24 ENCOUNTER — Ambulatory Visit (INDEPENDENT_AMBULATORY_CARE_PROVIDER_SITE_OTHER): Payer: No Typology Code available for payment source | Admitting: Endocrinology

## 2019-10-24 ENCOUNTER — Other Ambulatory Visit: Payer: Self-pay

## 2019-10-24 VITALS — BP 144/88 | HR 92 | Ht 69.0 in | Wt 138.0 lb

## 2019-10-24 DIAGNOSIS — E0590 Thyrotoxicosis, unspecified without thyrotoxic crisis or storm: Secondary | ICD-10-CM

## 2019-10-24 LAB — TSH: TSH: 1.11 u[IU]/mL (ref 0.35–4.50)

## 2019-10-24 LAB — T4, FREE: Free T4: 0.63 ng/dL (ref 0.60–1.60)

## 2019-10-24 NOTE — Patient Instructions (Addendum)
Your blood pressure is high today.  Please see your primary care provider soon, to have it rechecked Blood tests are requested for you today.  We'll let you know about the results.    If ever you have fever while taking methimazole, stop it and call us, even if the reason is obvious, because of the risk of a rare side-effect.   It is best to never miss the medication.  However, if you do miss it, next best is to double up the next time.   Please come back for a follow-up appointment in 4 months.   

## 2019-10-24 NOTE — Progress Notes (Signed)
Subjective:    Patient ID: Joseph Carr, male    DOB: 1970/11/17, 49 y.o.   MRN: [id number]  HPI Pt returns for f/u of hyperthyroidism (dx'ed 2015; he chose tapazole rx. he has never had thyroid imaging).   SDOH: Pt says he cannot be isolated for RAI rx.   Interval Hx: he takes tapazole 5 mg per day, as rx'ed.  pt states he feels well in general.  Past Medical History:  Diagnosis Date  . At risk for noncompliance   . CAD (coronary artery disease)    a. NSTEMI 10/2013 - DES to ramus intermedius.  b. 06/2014 Cath/PCI: LM nl, LAD 15p, RI patent stent prox, 84m (2.5x12 Promus Premier DES), LCX 40p, EF 55-65%.  . Habitual alcohol use   . Hyperlipidemia LDL goal <70   . Hypertension   . Low TSH level   . MVP (mitral valve prolapse)   . Tobacco abuse     Past Surgical History:  Procedure Laterality Date  . CARDIAC CATHETERIZATION N/A 07/03/2014   Procedure: Left Heart Cath and Coronary Angiography;  Surgeon: Peter M Swaziland, MD;  Location: [facility] INVASIVE CV LAB;  Service: Cardiovascular;  Laterality: N/A;  . CARDIAC CATHETERIZATION N/A 07/03/2014   Procedure: Coronary Stent Intervention;  Surgeon: Peter M Swaziland, MD;  Location: [facility] INVASIVE CV LAB;  Service: Cardiovascular;  Laterality: N/A;  . CORONARY ANGIOPLASTY    . FINGER FRACTURE SURGERY Left 2014   4th finger  . LEFT HEART CATHETERIZATION WITH CORONARY ANGIOGRAM N/A 10/20/2013   Procedure: LEFT HEART CATHETERIZATION WITH CORONARY ANGIOGRAM;  Surgeon: Corky Crafts, MD;  Location: [facility] CATH LAB;  Service: Cardiovascular;  Laterality: N/A;  . PERCUTANEOUS CORONARY STENT INTERVENTION (PCI-S)  07/03/2014   RAMUS      Social History   Socioeconomic History  . Marital status: Married    Spouse name: Not on file  . Number of children: Not on file  . Years of education: Not on file  . Highest education level: Not on file  Occupational History  . Occupation: Works in IT trainer  . Smoking status: Light Tobacco Smoker     Packs/day: 0.75    Years: 30.00    Pack years: 22.50    Types: Cigarettes    Last attempt to quit: 07/03/2014    Years since quitting: 5.3  . Smokeless tobacco: Never Used  Vaping Use  . Vaping Use: Never used  Substance and Sexual Activity  . Alcohol use: Yes  . Drug use: No  . Sexual activity: Not on file  Other Topics Concern  . Not on file  Social History Narrative   Lives with family   Social Determinants of Health   Financial Resource Strain:   . Difficulty of Paying Living Expenses: Not on file  Food Insecurity:   . Worried About Programme researcher, broadcasting/film/video in the Last Year: Not on file  . Ran Out of Food in the Last Year: Not on file  Transportation Needs:   . Lack of Transportation (Medical): Not on file  . Lack of Transportation (Non-Medical): Not on file  Physical Activity:   . Days of Exercise per Week: Not on file  . Minutes of Exercise per Session: Not on file  Stress:   . Feeling of Stress : Not on file  Social Connections:   . Frequency of Communication with Friends and Family: Not on file  . Frequency of Social Gatherings with Friends and Family: Not on file  .  Attends Religious Services: Not on file  . Active Member of Clubs or Organizations: Not on file  . Attends Banker Meetings: Not on file  . Marital Status: Not on file  Intimate Partner Violence:   . Fear of Current or Ex-Partner: Not on file  . Emotionally Abused: Not on file  . Physically Abused: Not on file  . Sexually Abused: Not on file    Current Outpatient Medications on File Prior to Visit  Medication Sig Dispense Refill  . aspirin EC 81 MG tablet Take 1 tablet (81 mg total) by mouth daily.    Marland Kitchen atorvastatin (LIPITOR) 80 MG tablet TAKE ONE TABLET BY MOUTH ONCE DAILY AT  6PM 90 tablet 3  . carvedilol (COREG) 6.25 MG tablet Take 1 tablet (6.25 mg total) by mouth 2 (two) times daily with a meal. 180 tablet 3  . chlorthalidone (HYGROTON) 25 MG tablet Take 1 tablet (25 mg total) by  mouth daily. 90 tablet 3  . Evolocumab (REPATHA SURECLICK) 140 MG/ML SOAJ Inject 140 mg into the skin every 14 (fourteen) days. 2 mL 11  . losartan (COZAAR) 100 MG tablet Take 1 tablet (100 mg total) by mouth daily. 90 tablet 3  . methimazole (TAPAZOLE) 5 MG tablet Take 1 tablet (5 mg total) by mouth daily. 90 tablet 1  . nitroGLYCERIN (NITROSTAT) 0.4 MG SL tablet DISSOLVE ONE TABLET UNDER THE TONGUE EVERY 5 MINUTES AS NEEDED FOR CHEST PAIN.  DO NOT EXCEED A TOTAL OF 3 DOSES IN 15 MINUTES 25 tablet 3   No current facility-administered medications on file prior to visit.    Allergies  Allergen Reactions  . Ace Inhibitors Swelling    Lip swelling that resolved with benadryl, did not compromise his breathing    Family History  Problem Relation Age of Onset  . Heart attack Father   . Hypertension Father   . Stroke Father   . Thyroid disease Neg Hx     BP (!) 144/88   Pulse 92   Ht 5\' 9"  (1.753 m)   Wt 138 lb (62.6 kg)   SpO2 95%   BMI 20.38 kg/m    Review of Systems Denies fever    Objective:   Physical Exam VITAL SIGNS:  See vs page GENERAL: no distress NECK: ? Of slight swelling at the mid-right thyroid area, but no palpable nodule.    Lab Results  Component Value Date   TSH 1.11 10/24/2019   T3TOTAL 146 12/27/2018       Assessment & Plan:  HTN: is noted today Hyperthyroidism: well-controlled.  Please continue the same methimazole  Patient Instructions  Your blood pressure is high today.  Please see your primary care provider soon, to have it rechecked Blood tests are requested for you today.  We'll let you know about the results.  If ever you have fever while taking methimazole, stop it and call 12/29/2018, even if the reason is obvious, because of the risk of a rare side-effect.  It is best to never miss the medication.  However, if you do miss it, next best is to double up the next time.   Please come back for a follow-up appointment in 4 months.

## 2019-10-26 NOTE — Assessment & Plan Note (Signed)
LDL is above goal for secondary prevention. Noted patient tolerated PCSK9i therapy in the past and stopped therapy for unknown reasons. Will resume PCSK9i therapy and follow up fasting blood work after 4th dose.

## 2019-10-26 NOTE — Assessment & Plan Note (Addendum)
BP remains above goal today, but patient reports anxiety and stress coming to the office today. Noted BP today is better than last reading during OV. Will continue current therapy as prescribed, and monitor BP daily. Plan to follow up by phone in 2 weeks and adjust therapy during follow up if needed.

## 2019-10-27 ENCOUNTER — Telehealth: Payer: Self-pay

## 2019-10-27 NOTE — Telephone Encounter (Signed)
RESULTS  Results were reviewed by Dr. Ellison. A letter has been mailed to pt home address. For future reference, letter can be found in Epic. 

## 2019-10-27 NOTE — Telephone Encounter (Signed)
-----   Message from Romero Belling, MD sent at 10/24/2019  4:49 PM EDT ----- please contact patient: Normal.  Please continue the same medication.  I'll see you next time.

## 2019-11-06 ENCOUNTER — Telehealth (HOSPITAL_COMMUNITY): Payer: Self-pay | Admitting: Emergency Medicine

## 2019-11-06 NOTE — Telephone Encounter (Signed)
Attempted to call patient regarding upcoming cardiac MR appointment. Left message on voicemail with name and callback number   RN Navigator Cardiac Imaging [facility] [phone] Office [phone] Cell  

## 2019-11-07 ENCOUNTER — Ambulatory Visit (HOSPITAL_COMMUNITY)
Admission: RE | Admit: 2019-11-07 | Discharge: 2019-11-07 | Disposition: A | Discharge status: Home / Self Care | Payer: No Typology Code available for payment source | Source: Ambulatory Visit | Attending: Cardiology | Admitting: Cardiology

## 2019-11-07 ENCOUNTER — Other Ambulatory Visit: Payer: Self-pay | Admitting: Endocrinology

## 2019-11-07 ENCOUNTER — Other Ambulatory Visit: Payer: Self-pay

## 2019-11-07 DIAGNOSIS — I517 Cardiomegaly: Secondary | ICD-10-CM | POA: Diagnosis present

## 2019-11-07 DIAGNOSIS — I358 Other nonrheumatic aortic valve disorders: Secondary | ICD-10-CM | POA: Insufficient documentation

## 2019-11-07 IMAGING — MR MR CARD MORPHOLOGY WO/W CM
45 of 48 series · 45 of 48 positions shown · IV contrast (Gadavist)
Comparison: none

CLINICAL DATA: LVH, R/O HOCM

EXAM:
CARDIAC MRI
TECHNIQUE: The patient was scanned on a 1.5 Tesla GE magnet. A dedicated
cardiac coil was used. Functional imaging was done using Fiesta
sequences. [DATE], and 4 chamber views were done to assess for RWMA's.
Modified POONAM rule using a short axis stack was used to
calculate an ejection fraction on a dedicated work station using
Circle software. The patient received cc of Multihance. After 10
minutes inversion recovery sequences were used to assess for
infiltration and scar tissue.

[Series 4: t2_haste_db_tra_bh · axial · 8.0mm · 1.33mm/px · 1 of 16 slices shown]
[im 1/16]
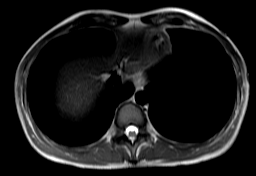

[Series 8: bSSFP · oblique · 8.0mm · 1.61mm/px · 1 of 25 slices shown (1 of 18)]
[im 1/25]
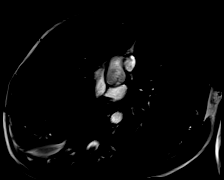

[Series 9: bSSFP · oblique · 8.0mm · 1.61mm/px · 1 of 25 slices shown (2 of 18)]
[im 1/25]
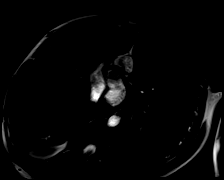

[Series 10: bSSFP · oblique · 8.0mm · 1.61mm/px · 1 of 25 slices shown (3 of 18)]
[im 1/25]
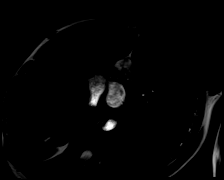

[Series 11: bSSFP · oblique · 8.0mm · 1.61mm/px · 1 of 25 slices shown (4 of 18)]
[im 1/25]
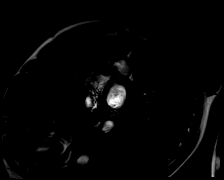

[Series 12: bSSFP · oblique · 8.0mm · 1.61mm/px · 1 of 25 slices shown (5 of 18)]
[im 1/25]
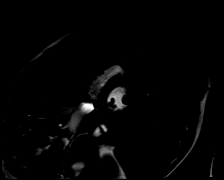

[Series 13: bSSFP · oblique · 8.0mm · 1.61mm/px · 1 of 25 slices shown (6 of 18)]
[im 1/25]
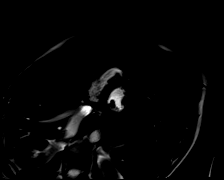

[Series 14: bSSFP · oblique · 8.0mm · 1.61mm/px · 1 of 25 slices shown (7 of 18)]
[im 1/25]
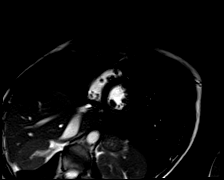

[Series 16: bSSFP · oblique · 8.0mm · 1.61mm/px · 1 of 25 slices shown (8 of 18)]
[im 1/25]
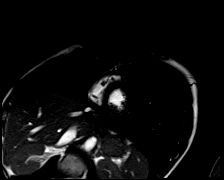

[Series 17: bSSFP · oblique · 8.0mm · 1.61mm/px · 1 of 25 slices shown (9 of 18)]
[im 1/25]
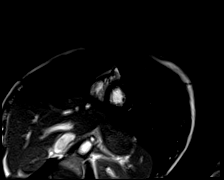

[Series 18: bSSFP · oblique · 8.0mm · 1.61mm/px · 1 of 25 slices shown (10 of 18)]
[im 1/25]
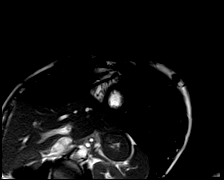

[Series 19: bSSFP · oblique · 8.0mm · 1.61mm/px · 1 of 25 slices shown (11 of 18)]
[im 1/25]
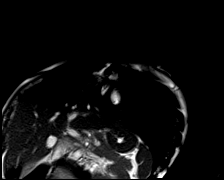

[Series 20: bSSFP · oblique · 8.0mm · 1.61mm/px · 1 of 25 slices shown (12 of 18)]
[im 1/25]
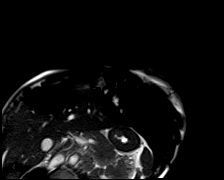

[Series 21: bSSFP · oblique · 8.0mm · 1.61mm/px · 1 of 25 slices shown (13 of 18)]
[im 1/25]
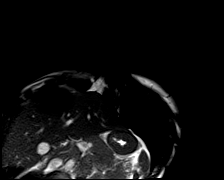

[Series 22: bSSFP · oblique · 8.0mm · 1.61mm/px · 1 of 25 slices shown (14 of 18)]
[im 1/25]
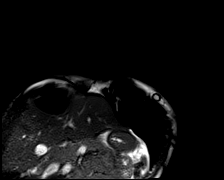

[Series 23: (id)_long_t1 · oblique · 8.0mm · 1.88mm/px · 1 of 24 slices shown]
[im 1/24]
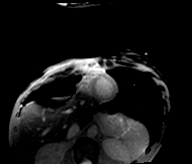

[Series 24: (id)_long_t1_moco · oblique · 8.0mm · 1.88mm/px · 1 of 24 slices shown]
[im 1/24]
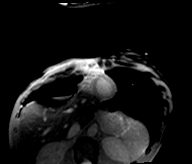

[Series 25: (id)_long_t1_moco_t1 · 1 of 3 slices shown (1 of 2)]
[im 1/3]
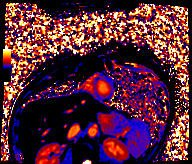

[Series 25: (id)_long_t1_moco_t1 · oblique · 8.0mm · 1.88mm/px · 1 of 3 slices shown (2 of 2)]
[im 1/3]
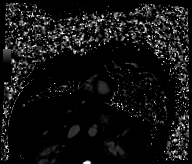

[Series 27: (id)_trufi · oblique · 8.0mm · 1.88mm/px · 1 of 9 slices shown]
[im 1/9]
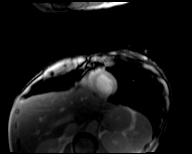

[Series 28: (id)_trufi_moco · oblique · 8.0mm · 1.88mm/px · 1 of 9 slices shown]
[im 1/9]
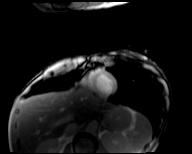

[Series 29: (id)_trufi_moco_t2 · oblique · 8.0mm · 1.88mm/px · 1 of 3 slices shown]
[im 1/3]
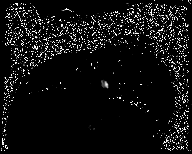

[Series 31: bSSFP · oblique · 6.0mm · 1.48mm/px · 1 of 25 slices shown (15 of 18)]
[im 1/25]
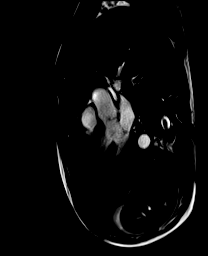

[Series 32: bSSFP · oblique · 6.0mm · 1.48mm/px · 1 of 25 slices shown (16 of 18)]
[im 1/25]
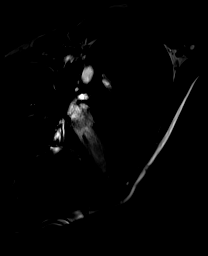

[Series 33: bSSFP · oblique · 6.0mm · 1.41mm/px · 1 of 25 slices shown (17 of 18)]
[im 1/25]
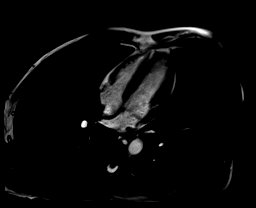

[Series 34: bSSFP · coronal · 6.0mm · 1.41mm/px · 1 of 25 slices shown (18 of 18)]
[im 1/25]
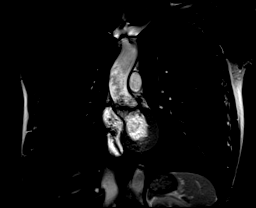

[Series 35: aortic valve cine · axial · 6.0mm · 1.41mm/px · 1 of 25 slices shown]
[im 1/25]
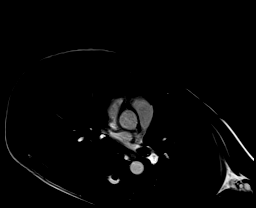

[Series 36: cine rvit · oblique · 6.0mm · 1.41mm/px · 1 of 25 slices shown]
[im 1/25]
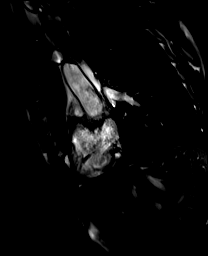

[Series 37: cine rvot · sagittal · 6.0mm · 1.41mm/px · 1 of 25 slices shown]
[im 1/25]
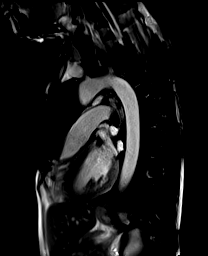

[Series 39: lge_single shot sa · oblique · 8.0mm · 1.98mm/px · 1 of 10 slices shown (1 of 2)]
[im 1/10]
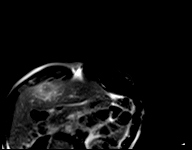

[Series 40: lge_single shot sa · oblique · 8.0mm · 1.98mm/px · 1 of 10 slices shown (2 of 2)]
[im 1/10]
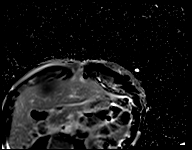

[Series 47: (id)_short_t1 · oblique · 8.0mm · 1.41mm/px · 1 of 27 slices shown]
[im 1/27]
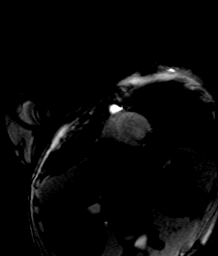

[Series 48: (id)_short_t1_moco · oblique · 8.0mm · 1.41mm/px · 1 of 27 slices shown]
[im 1/27]
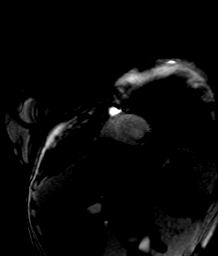

[Series 49: (id)_short_t1_moco_t1 · oblique · 8.0mm · 1.41mm/px · 1 of 3 slices shown (1 of 2)]
[im 1/3]
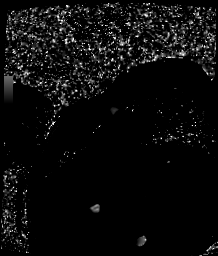

[Series 49: (id)_short_t1_moco_t1 · 1 of 3 slices shown (2 of 2)]
[im 1/3]
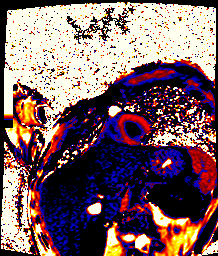

[Series 52: lge short axis · oblique · 8.0mm · 1.50mm/px · 1 of 15 slices shown (1 of 2)]
[im 1/15]
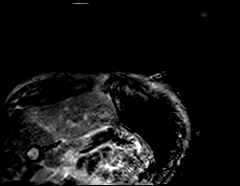

[Series 53: lge short axis · oblique · 8.0mm · 1.50mm/px · 1 of 15 slices shown (2 of 2)]
[im 1/15]
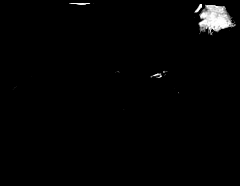

[Series 54: pc flow_200_tp_retro_breath hold · axial · 6.0mm · 1.73mm/px · 1 of 30 slices shown]
[im 1/30]
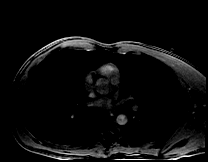

[Series 55: pc flow_200_tp_retro_breath hold_mag · axial · 6.0mm · 1.73mm/px · 1 of 30 slices shown]
[im 1/30]
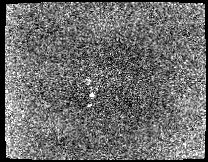

[Series 56: pc flow_200_tp_retro_breath hold_p · axial · 6.0mm · 1.73mm/px · 1 of 30 slices shown]
[im 1/30]
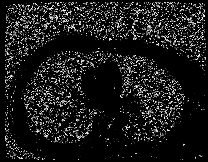

[Series 57: pc flow_250_tp_retro_breath hold · axial · 6.0mm · 1.73mm/px · 1 of 30 slices shown (1 of 2)]
[im 1/30]
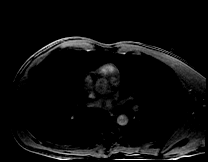

[Series 58: pc flow_250_tp_retro_breath hold_mag · axial · 6.0mm · 1.73mm/px · 1 of 28 slices shown (1 of 2)]
[im 1/28]
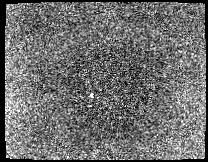

[Series 59: pc flow_250_tp_retro_breath hold_p · axial · 6.0mm · 1.73mm/px · 1 of 30 slices shown]
[im 1/30]
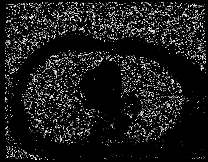

[Series 60: pc flow_250_tp_retro_breath hold · axial · 6.0mm · 1.73mm/px · 1 of 30 slices shown (2 of 2)]
[im 1/30]
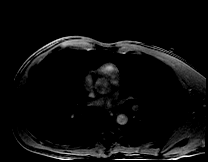

[Series 61: pc flow_250_tp_retro_breath hold_mag · axial · 6.0mm · 1.73mm/px · 1 of 30 slices shown (2 of 2)]
[im 1/30]
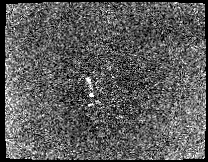

[45 of 48 positions shown; findings below may reference images not displayed]

CONTRAST:  Normal atrial sizes. No ASD/PFO. No pericardial effusion.
Normal aortic root. AV thickened with mild turbulence across valve
suggesting sclerosis with mild AR. Flat closure mitral valve with
trivial MR Normal RV size and function. The LV has moderate
concentric LVH with both septal and posterior walls measuring 14 mm
in diastole. There is no POONAM or LVOT turbulence. There is no delayed
hyper-enhancement on gadolinium images Para metric measures are also
normal with T1 [C7] and ECG 37%
FINDINGS: 1. Findings not consistent with HOCM with concentric LVH 14 mm and
no POONAM or LVOT gradient Suggests HTN heart disease

2.  Normal LV size and function EF 59% with abnormal septal motion

3.  Normal RV size and function

4.  No delayed gadolinium uptake on inversion recovery sequences

5.  Flat closure MV with trivial MR

6.  AV sclerosis with mild AR

7.  Normal T1 and ECV
IMPRESSION: POONAM

## 2019-11-07 MED ORDER — GADOBUTROL 1 MMOL/ML IV SOLN
10.0000 mL | Freq: Once | INTRAVENOUS | Status: AC | PRN
  Administered 2019-11-07: 10 mL via INTRAVENOUS

## 2019-11-09 ENCOUNTER — Encounter: Payer: Self-pay | Admitting: Cardiology

## 2019-11-09 DIAGNOSIS — I119 Hypertensive heart disease without heart failure: Secondary | ICD-10-CM | POA: Insufficient documentation

## 2019-11-27 ENCOUNTER — Other Ambulatory Visit: Payer: Self-pay

## 2019-11-27 ENCOUNTER — Ambulatory Visit (HOSPITAL_COMMUNITY)
Admission: EM | Admit: 2019-11-27 | Discharge: 2019-11-27 | Disposition: A | Discharge status: Home / Self Care | Payer: No Typology Code available for payment source | Attending: Family Medicine | Admitting: Family Medicine

## 2019-11-27 ENCOUNTER — Encounter (HOSPITAL_COMMUNITY): Payer: Self-pay | Admitting: Emergency Medicine

## 2019-11-27 DIAGNOSIS — S161XXA Strain of muscle, fascia and tendon at neck level, initial encounter: Secondary | ICD-10-CM | POA: Diagnosis not present

## 2019-11-27 MED ORDER — IBUPROFEN 800 MG PO TABS: 800.0000 mg | ORAL_TABLET | Freq: Three times a day (TID) | ORAL | 0 refills | Status: DC

## 2019-11-27 MED ORDER — TRAMADOL HCL 50 MG PO TABS: 50.0000 mg | ORAL_TABLET | Freq: Four times a day (QID) | ORAL | 0 refills | Status: DC | PRN

## 2019-11-27 MED ORDER — TIZANIDINE HCL 4 MG PO TABS: 4.0000 mg | ORAL_TABLET | Freq: Four times a day (QID) | ORAL | 0 refills | Status: DC | PRN

## 2019-11-27 NOTE — ED Triage Notes (Signed)
Pt c/o neck pain with limited ROM onset this morning around 9 am. Pt states he has been using icy hot and otc cream with some relief.

## 2019-11-27 NOTE — ED Provider Notes (Signed)
[facility]    CSN: [id number] Arrival date & time: 11/27/19  1836      History   Chief Complaint Chief Complaint  Patient presents with  . Neck Pain    HPI Joseph Carr is a 49 y.o. male.   HPI  Patient states he woke up feeling normal.  He did not have any trauma or accident.  He was at his work doing his usual job.  Is been in the same job for 10 years.  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  As he was doing this he was looking over shoulder.  He felt a sudden pull and pain in the left side of his neck.  He continued to try to work.  He states he felt the pain at 9:00 and by 12 he had to leave work.  He went home and tried to use icy hot.  It still very painful.  He knows he will be able to work tomorrow.  He is here for evaluation. He denies any prior neck problems. Patient states that he does have high blood pressure, high cholesterol, thyroid and "a minor heart condition".  He is compliant with medical care.  Past Medical History:  Diagnosis Date  . At risk for noncompliance   . CAD (coronary artery disease)    a. NSTEMI 10/2013 - DES to ramus intermedius.  b. 06/2014 Cath/PCI: LM nl, LAD 15p, RI patent stent prox, 5m (2.5x12 Promus Premier DES), LCX 40p, EF 55-65%.  . Habitual alcohol use   . Hyperlipidemia LDL goal <70   . Hypertension   . Hypertensive heart disease    mild LVH on MRI with no HOCM  . Low TSH level   . MVP (mitral valve prolapse)   . Tobacco abuse     Patient Active Problem List   Diagnosis Date Noted  . Hypertensive heart disease   . Hyperthyroidism 02/28/2019  . Tobacco abuse 10/30/2013  . CAD (coronary artery disease) 10/21/2013    Class: Acute  . Presence of drug coated stent in Circumflex-Ramus Intermedius branch: Promus DES 2.5 mm x 20 mm (2.75 mm) placed 10/20/13 10/21/2013  . Essential hypertension     Class: Chronic  . Hyperlipidemia LDL goal <70     Past Surgical History:  Procedure  Laterality Date  . CARDIAC CATHETERIZATION N/A 07/03/2014   Procedure: Left Heart Cath and Coronary Angiography;  Surgeon: Peter M Swaziland, MD;  Location: [facility] INVASIVE CV LAB;  Service: Cardiovascular;  Laterality: N/A;  . CARDIAC CATHETERIZATION N/A 07/03/2014   Procedure: Coronary Stent Intervention;  Surgeon: Peter M Swaziland, MD;  Location: [facility] INVASIVE CV LAB;  Service: Cardiovascular;  Laterality: N/A;  . CORONARY ANGIOPLASTY    . FINGER FRACTURE SURGERY Left 2014   4th finger  . LEFT HEART CATHETERIZATION WITH CORONARY ANGIOGRAM N/A 10/20/2013   Procedure: LEFT HEART CATHETERIZATION WITH CORONARY ANGIOGRAM;  Surgeon: Corky Crafts, MD;  Location: [facility] CATH LAB;  Service: Cardiovascular;  Laterality: N/A;  . PERCUTANEOUS CORONARY STENT INTERVENTION (PCI-S)  07/03/2014   RAMUS         Home Medications    Prior to Admission medications   Medication Sig Start Date End Date Taking? Authorizing Provider  aspirin EC 81 MG tablet Take 1 tablet (81 mg total) by mouth daily. 10/26/15  Yes Turner, Cornelious Bryant, MD  atorvastatin (LIPITOR) 80 MG tablet TAKE ONE TABLET BY MOUTH ONCE DAILY AT  [facility] 12/06/18  Yes Dunn, Dayna N, PA-C  carvedilol (COREG) 6.25 MG tablet Take 1 tablet (6.25 mg total) by mouth 2 (two) times daily with a meal. 09/01/19  Yes Turner, Cornelious Bryant, MD  chlorthalidone (HYGROTON) 25 MG tablet Take 1 tablet (25 mg total) by mouth daily. 09/18/19  Yes Turner, Cornelious Bryant, MD  Evolocumab (REPATHA SURECLICK) 140 MG/ML SOAJ Inject 140 mg into the skin every 14 (fourteen) days. 10/22/19  Yes Turner, Cornelious Bryant, MD  losartan (COZAAR) 100 MG tablet Take 1 tablet (100 mg total) by mouth daily. 09/01/19  Yes Quintella Reichert, MD  methimazole (TAPAZOLE) 5 MG tablet Take 1 tablet by mouth once daily 11/07/19  Yes Romero Belling, MD  ibuprofen (ADVIL) 800 MG tablet Take 1 tablet (800 mg total) by mouth 3 (three) times daily. 11/27/19   Eustace Moore, MD  nitroGLYCERIN (NITROSTAT) 0.4 MG SL tablet DISSOLVE ONE  TABLET UNDER THE TONGUE EVERY 5 MINUTES AS NEEDED FOR CHEST PAIN.  DO NOT EXCEED A TOTAL OF 3 DOSES IN 15 MINUTES 12/06/18   Dunn, Dayna N, PA-C  tiZANidine (ZANAFLEX) 4 MG tablet Take 1-2 tablets (4-8 mg total) by mouth every 6 (six) hours as needed for muscle spasms. 11/27/19   Eustace Moore, MD  traMADol (ULTRAM) 50 MG tablet Take 1 tablet (50 mg total) by mouth every 6 (six) hours as needed. 11/27/19   Eustace Moore, MD    Family History Family History  Problem Relation Age of Onset  . Heart attack Father   . Hypertension Father   . Stroke Father   . Thyroid disease Neg Hx     Social History Social History   Tobacco Use  . Smoking status: Light Tobacco Smoker    Packs/day: 0.75    Years: 30.00    Pack years: 22.50    Types: Cigarettes    Last attempt to quit: 07/03/2014    Years since quitting: 5.4  . Smokeless tobacco: Never Used  Vaping Use  . Vaping Use: Never used  Substance Use Topics  . Alcohol use: Yes  . Drug use: No     Allergies   Ace inhibitors   Review of Systems Review of Systems See HPI  Physical Exam Triage Vital Signs ED Triage Vitals  Enc Vitals Group     BP 11/27/19 1912 134/75     Pulse Rate 11/27/19 1912 88     Resp --      Temp 11/27/19 1912 97.8 F (36.6 C)     Temp Source 11/27/19 1912 Oral     SpO2 11/27/19 1912 100 %     Weight --      Height --      Head Circumference --      Peak Flow --      Pain Score 11/27/19 1913 8     Pain Loc --      Pain Edu? --      Excl. in GC? --    No data found.  Updated Vital Signs BP 134/75 (BP Location: Left Arm)   Pulse 88   Temp 97.8 F (36.6 C) (Oral)   SpO2 100%        Physical Exam Constitutional:      General: He is not in acute distress.    Appearance: He is well-developed.     Comments: Lean in appearance.  No acute distress but he does have a stiff and guarded movements   HENT:     Head:  Normocephalic and atraumatic.     Mouth/Throat:     Comments: Mask is  in place Eyes:     Conjunctiva/sclera: Conjunctivae normal.     Pupils: Pupils are equal, round, and reactive to light.  Neck:     Comments: Tenderness is present in the left paraspinous muscles from the occiput down to the upper body of the trapezius.  Mild tenderness along the medial border of the scapula.  All of this is on the left.  Strength sensation range of motion and reflexes are normal in both upper extremities Cardiovascular:     Rate and Rhythm: Normal rate and regular rhythm.     Heart sounds: Normal heart sounds.  Pulmonary:     Effort: Pulmonary effort is normal. No respiratory distress.     Breath sounds: Normal breath sounds.     Comments: Respiratory rate is 14 Abdominal:     General: There is no distension.     Palpations: Abdomen is soft.  Musculoskeletal:        General: Normal range of motion.     Cervical back: Rigidity and tenderness present.  Skin:    General: Skin is warm and dry.  Neurological:     Mental Status: He is alert.     Motor: No weakness.     Coordination: Coordination normal.     Gait: Gait normal.     Deep Tendon Reflexes: Reflexes normal.  Psychiatric:        Mood and Affect: Mood normal.        Behavior: Behavior normal.      UC Treatments / Results  Labs (all labs ordered are listed, but only abnormal results are displayed) Labs Reviewed - No data to display  EKG   Radiology No results found.  Procedures Procedures (including critical care time)  Medications Ordered in UC Medications - No data to display  Initial Impression / Assessment and Plan / UC Course  I have reviewed the triage vital signs and the nursing notes.  Pertinent labs & imaging results that were available during my care of the patient were reviewed by me and considered in my medical decision making (see chart for details).     Reviewed patient is a muscle spasm in his neck.  Unclear relationship to his work activities.  He states he was doing his  usual work activities when the pain occurred.  No fall or trauma.  We will treat him with pain medicine, muscle relaxers, heat and rest.  He can go back to work on Monday.  He should call or return if unable to return to work by Monday Final Clinical Impressions(s) / UC Diagnoses   Final diagnoses:  Acute strain of neck muscle, initial encounter     Discharge Instructions     Home to rest Use warmth on painful muscles Take ibuprofen 3 times a day with food Take tizanidine as needed for muscle relaxer.  This is useful at bedtime Take tramadol if needed for severe pain.  You may take this in addition to the other 2 medicines.  This medicine can cause drowsiness.  Do not drive on the tramadol Expect improvement over next few days.  Call or return if not improving by Monday, or see your primary care physician    ED Prescriptions    Medication Sig Dispense Auth. Provider   ibuprofen (ADVIL) 800 MG tablet Take 1 tablet (800 mg total) by mouth 3 (three) times daily. 21 tablet Eustace Moore, MD  tiZANidine (ZANAFLEX) 4 MG tablet Take 1-2 tablets (4-8 mg total) by mouth every 6 (six) hours as needed for muscle spasms. 21 tablet Eustace MooreNelson,  Sue, MD   traMADol (ULTRAM) 50 MG tablet Take 1 tablet (50 mg total) by mouth every 6 (six) hours as needed. 15 tablet Eustace MooreNelson,  Sue, MD     I have reviewed the PDMP during this encounter.   Eustace MooreNelson,  Sue, MD 11/27/19 [phone]

## 2019-11-27 NOTE — Discharge Instructions (Addendum)
Home to rest Use warmth on painful muscles Take ibuprofen 3 times a day with food Take tizanidine as needed for muscle relaxer.  This is useful at bedtime Take tramadol if needed for severe pain.  You may take this in addition to the other 2 medicines.  This medicine can cause drowsiness.  Do not drive on the tramadol Expect improvement over next few days.  Call or return if not improving by Monday, or see your primary care physician

## 2020-02-02 ENCOUNTER — Encounter (HOSPITAL_COMMUNITY): Payer: Self-pay | Admitting: Emergency Medicine

## 2020-02-02 ENCOUNTER — Ambulatory Visit (HOSPITAL_COMMUNITY)
Admission: EM | Admit: 2020-02-02 | Discharge: 2020-02-02 | Disposition: A | Discharge status: Home / Self Care | Payer: No Typology Code available for payment source | Attending: Physician Assistant | Admitting: Physician Assistant

## 2020-02-02 ENCOUNTER — Other Ambulatory Visit: Payer: Self-pay

## 2020-02-02 DIAGNOSIS — S39012A Strain of muscle, fascia and tendon of lower back, initial encounter: Secondary | ICD-10-CM | POA: Diagnosis not present

## 2020-02-02 MED ORDER — NAPROXEN 500 MG PO TABS: 500.0000 mg | ORAL_TABLET | Freq: Two times a day (BID) | ORAL | 0 refills | Status: DC

## 2020-02-02 MED ORDER — CYCLOBENZAPRINE HCL 5 MG PO TABS: 5.0000 mg | ORAL_TABLET | Freq: Three times a day (TID) | ORAL | 0 refills | Status: DC | PRN

## 2020-02-02 NOTE — Discharge Instructions (Signed)
Take medication as prescribed Follow up with PCP if no improvement Apply ice and heat to back 15 minutes 4 times per day Stretch back daily.

## 2020-02-02 NOTE — ED Triage Notes (Signed)
Pt c/o of right mid back pain with movement x 1 day. Denies injury.

## 2020-02-02 NOTE — ED Provider Notes (Signed)
[facility]    CSN: [id number] Arrival date & time: 02/02/20  1727      History   Chief Complaint Chief Complaint  Patient presents with  . Back Pain    HPI Joseph Carr is a 49 y.o. male.   Patient here c/w R sided lower lumbar pain x this morning.  He bent down to rub calf after muscle cramp, stood up and felt pain in back.  Took 400 mg ibuprofen early this morning w/o relief, none since.  Admits pain, tenderness, denies n/t, weakness, ecchymosis.     Past Medical History:  Diagnosis Date  . At risk for noncompliance   . CAD (coronary artery disease)    a. NSTEMI 10/2013 - DES to ramus intermedius.  b. 06/2014 Cath/PCI: LM nl, LAD 15p, RI patent stent prox, 15m (2.5x12 Promus Premier DES), LCX 40p, EF 55-65%.  . Habitual alcohol use   . Hyperlipidemia LDL goal <70   . Hypertension   . Hypertensive heart disease    mild LVH on MRI with no HOCM  . Low TSH level   . MVP (mitral valve prolapse)   . Tobacco abuse     Patient Active Problem List   Diagnosis Date Noted  . Hypertensive heart disease   . Hyperthyroidism 02/28/2019  . Tobacco abuse 10/30/2013  . CAD (coronary artery disease) 10/21/2013    Class: Acute  . Presence of drug coated stent in Circumflex-Ramus Intermedius branch: Promus DES 2.5 mm x 20 mm (2.75 mm) placed 10/20/13 10/21/2013  . Essential hypertension     Class: Chronic  . Hyperlipidemia LDL goal <70     Past Surgical History:  Procedure Laterality Date  . CARDIAC CATHETERIZATION N/A 07/03/2014   Procedure: Left Heart Cath and Coronary Angiography;  Surgeon: Peter M Swaziland, MD;  Location: [facility] INVASIVE CV LAB;  Service: Cardiovascular;  Laterality: N/A;  . CARDIAC CATHETERIZATION N/A 07/03/2014   Procedure: Coronary Stent Intervention;  Surgeon: Peter M Swaziland, MD;  Location: [facility] INVASIVE CV LAB;  Service: Cardiovascular;  Laterality: N/A;  . CORONARY ANGIOPLASTY    . FINGER FRACTURE SURGERY Left 2014   4th finger  . LEFT HEART  CATHETERIZATION WITH CORONARY ANGIOGRAM N/A 10/20/2013   Procedure: LEFT HEART CATHETERIZATION WITH CORONARY ANGIOGRAM;  Surgeon: Corky Crafts, MD;  Location: [facility] CATH LAB;  Service: Cardiovascular;  Laterality: N/A;  . PERCUTANEOUS CORONARY STENT INTERVENTION (PCI-S)  07/03/2014   RAMUS         Home Medications    Prior to Admission medications   Medication Sig Start Date End Date Taking? Authorizing Provider  cyclobenzaprine (FLEXERIL) 5 MG tablet Take 1 tablet (5 mg total) by mouth 3 (three) times daily as needed for muscle spasms. 02/02/20  Yes Evern Core, PA-C  naproxen (NAPROSYN) 500 MG tablet Take 1 tablet (500 mg total) by mouth 2 (two) times daily. 02/02/20  Yes Evern Core, PA-C  aspirin EC 81 MG tablet Take 1 tablet (81 mg total) by mouth daily. 10/26/15   Quintella Reichert, MD  atorvastatin (LIPITOR) 80 MG tablet TAKE ONE TABLET BY MOUTH ONCE DAILY AT  6PM 12/06/18   Laurann Montana, PA-C  carvedilol (COREG) 6.25 MG tablet Take 1 tablet (6.25 mg total) by mouth 2 (two) times daily with a meal. 09/01/19   Turner, Cornelious Bryant, MD  chlorthalidone (HYGROTON) 25 MG tablet Take 1 tablet (25 mg total) by mouth daily. 09/18/19   Quintella Reichert, MD  Evolocumab (REPATHA SURECLICK)  140 MG/ML SOAJ Inject 140 mg into the skin every 14 (fourteen) days. 10/22/19   Quintella Reichert, MD  ibuprofen (ADVIL) 800 MG tablet Take 1 tablet (800 mg total) by mouth 3 (three) times daily. 11/27/19   Eustace Moore, MD  losartan (COZAAR) 100 MG tablet Take 1 tablet (100 mg total) by mouth daily. 09/01/19   Quintella Reichert, MD  methimazole (TAPAZOLE) 5 MG tablet Take 1 tablet by mouth once daily 11/07/19   Romero Belling, MD  nitroGLYCERIN (NITROSTAT) 0.4 MG SL tablet DISSOLVE ONE TABLET UNDER THE TONGUE EVERY 5 MINUTES AS NEEDED FOR CHEST PAIN.  DO NOT EXCEED A TOTAL OF 3 DOSES IN 15 MINUTES 12/06/18   Dunn, Dayna N, PA-C  tiZANidine (ZANAFLEX) 4 MG tablet Take 1-2 tablets (4-8 mg total) by mouth every 6  (six) hours as needed for muscle spasms. 11/27/19   Eustace Moore, MD  traMADol (ULTRAM) 50 MG tablet Take 1 tablet (50 mg total) by mouth every 6 (six) hours as needed. 11/27/19   Eustace Moore, MD    Family History Family History  Problem Relation Age of Onset  . Heart attack Father   . Hypertension Father   . Stroke Father   . Thyroid disease Neg Hx     Social History Social History   Tobacco Use  . Smoking status: Light Tobacco Smoker    Packs/day: 0.75    Years: 30.00    Pack years: 22.50    Types: Cigarettes    Last attempt to quit: 07/03/2014    Years since quitting: 5.5  . Smokeless tobacco: Never Used  Vaping Use  . Vaping Use: Never used  Substance Use Topics  . Alcohol use: Yes  . Drug use: No     Allergies   Ace inhibitors   Review of Systems Review of Systems  Constitutional: Negative for chills, fatigue and fever.  Gastrointestinal: Negative for abdominal pain, constipation, diarrhea, nausea and vomiting.  Genitourinary: Negative for dysuria, flank pain, frequency and testicular pain.  Musculoskeletal: Positive for back pain and myalgias. Negative for arthralgias.  Skin: Negative for color change.  Neurological: Negative for weakness and numbness.  Hematological: Negative for adenopathy. Does not bruise/bleed easily.  Psychiatric/Behavioral: Negative for confusion and sleep disturbance.     Physical Exam Triage Vital Signs ED Triage Vitals  Enc Vitals Group     BP 02/02/20 1939 102/80     Pulse Rate 02/02/20 1939 97     Resp 02/02/20 1939 20     Temp 02/02/20 1939 99 F (37.2 C)     Temp Source 02/02/20 1939 Oral     SpO2 --      Weight --      Height --      Head Circumference --      Peak Flow --      Pain Score 02/02/20 1936 9     Pain Loc --      Pain Edu? --      Excl. in GC? --    No data found.  Updated Vital Signs BP 102/80 (BP Location: Right Arm)   Pulse 97   Temp 99 F (37.2 C) (Oral)   Resp 20   Visual  Acuity Right Eye Distance:   Left Eye Distance:   Bilateral Distance:    Right Eye Near:   Left Eye Near:    Bilateral Near:     Physical Exam Vitals and nursing note reviewed.  Constitutional:  Appearance: Normal appearance. He is well-developed.  HENT:     Head: Normocephalic and atraumatic.     Nose: Nose normal.     Mouth/Throat:     Mouth: Mucous membranes are moist.  Eyes:     General: No scleral icterus.    Conjunctiva/sclera: Conjunctivae normal.  Pulmonary:     Effort: Pulmonary effort is normal. No respiratory distress.  Abdominal:     General: There is no distension.     Tenderness: There is no abdominal tenderness. There is no right CVA tenderness, left CVA tenderness, guarding or rebound.  Musculoskeletal:     Cervical back: Normal range of motion and neck supple. No rigidity.     Lumbar back: Spasms and tenderness (diffuse R sided lumbar tenderness) present. No bony tenderness. Decreased range of motion. Negative right straight leg raise test and negative left straight leg raise test.  Skin:    General: Skin is warm and dry.     Capillary Refill: Capillary refill takes less than 2 seconds.  Neurological:     General: No focal deficit present.     Mental Status: He is alert and oriented to person, place, and time.     Motor: No weakness.     Gait: Gait normal.  Psychiatric:        Mood and Affect: Mood normal.        Behavior: Behavior normal.      UC Treatments / Results  Labs (all labs ordered are listed, but only abnormal results are displayed) Labs Reviewed - No data to display  EKG   Radiology No results found.  Procedures Procedures (including critical care time)  Medications Ordered in UC Medications - No data to display  Initial Impression / Assessment and Plan / UC Course  I have reviewed the triage vital signs and the nursing notes.  Pertinent labs & imaging results that were available during my care of the patient were  reviewed by me and considered in my medical decision making (see chart for details).     Take medication as prescribed Follow up with PCP if no improvement Apply ice and heat to back 15 minutes 4 times per day Stretch back daily. Final Clinical Impressions(s) / UC Diagnoses   Final diagnoses:  Strain of lumbar region, initial encounter     Discharge Instructions     Take medication as prescribed Follow up with PCP if no improvement Apply ice and heat to back 15 minutes 4 times per day Stretch back daily.     ED Prescriptions    Medication Sig Dispense Auth. Provider   naproxen (NAPROSYN) 500 MG tablet Take 1 tablet (500 mg total) by mouth 2 (two) times daily. 20 tablet Evern Core, PA-C   cyclobenzaprine (FLEXERIL) 5 MG tablet Take 1 tablet (5 mg total) by mouth 3 (three) times daily as needed for muscle spasms. 10 tablet Evern Core, PA-C     PDMP not reviewed this encounter.   Evern Core, PA-C 02/02/20 2015

## 2020-02-27 ENCOUNTER — Ambulatory Visit: Payer: No Typology Code available for payment source | Admitting: Endocrinology

## 2020-03-05 ENCOUNTER — Ambulatory Visit: Payer: No Typology Code available for payment source | Admitting: Endocrinology

## 2020-03-19 ENCOUNTER — Ambulatory Visit: Payer: No Typology Code available for payment source | Admitting: Endocrinology

## 2020-04-26 ENCOUNTER — Other Ambulatory Visit: Payer: Self-pay

## 2020-04-26 ENCOUNTER — Encounter (HOSPITAL_COMMUNITY): Payer: Self-pay

## 2020-04-26 ENCOUNTER — Ambulatory Visit (HOSPITAL_COMMUNITY)
Admission: EM | Admit: 2020-04-26 | Discharge: 2020-04-26 | Disposition: A | Discharge status: Home / Self Care | Payer: No Typology Code available for payment source | Attending: Emergency Medicine | Admitting: Emergency Medicine

## 2020-04-26 ENCOUNTER — Emergency Department (HOSPITAL_COMMUNITY): Payer: No Typology Code available for payment source

## 2020-04-26 ENCOUNTER — Emergency Department (HOSPITAL_COMMUNITY)
Admission: EM | Admit: 2020-04-26 | Discharge: 2020-04-26 | Disposition: A | Discharge status: Home / Self Care | Payer: No Typology Code available for payment source | Attending: Emergency Medicine | Admitting: Emergency Medicine

## 2020-04-26 ENCOUNTER — Encounter (HOSPITAL_COMMUNITY): Payer: Self-pay | Admitting: Emergency Medicine

## 2020-04-26 DIAGNOSIS — Z7982 Long term (current) use of aspirin: Secondary | ICD-10-CM | POA: Diagnosis not present

## 2020-04-26 DIAGNOSIS — F17210 Nicotine dependence, cigarettes, uncomplicated: Secondary | ICD-10-CM | POA: Insufficient documentation

## 2020-04-26 DIAGNOSIS — Z79899 Other long term (current) drug therapy: Secondary | ICD-10-CM | POA: Insufficient documentation

## 2020-04-26 DIAGNOSIS — R200 Anesthesia of skin: Secondary | ICD-10-CM

## 2020-04-26 DIAGNOSIS — R42 Dizziness and giddiness: Secondary | ICD-10-CM

## 2020-04-26 DIAGNOSIS — Z9861 Coronary angioplasty status: Secondary | ICD-10-CM | POA: Insufficient documentation

## 2020-04-26 DIAGNOSIS — I2510 Atherosclerotic heart disease of native coronary artery without angina pectoris: Secondary | ICD-10-CM | POA: Diagnosis not present

## 2020-04-26 DIAGNOSIS — I10 Essential (primary) hypertension: Secondary | ICD-10-CM | POA: Diagnosis not present

## 2020-04-26 DIAGNOSIS — R531 Weakness: Secondary | ICD-10-CM

## 2020-04-26 DIAGNOSIS — M6281 Muscle weakness (generalized): Secondary | ICD-10-CM | POA: Insufficient documentation

## 2020-04-26 LAB — PROTIME-INR
INR: 0.9 (ref 0.8–1.2)
Prothrombin Time: 11.7 seconds (ref 11.4–15.2)

## 2020-04-26 LAB — I-STAT CHEM 8, ED
BUN: 13 mg/dL (ref 6–20)
Calcium, Ion: 1.18 mmol/L (ref 1.15–1.40)
Chloride: 102 mmol/L (ref 98–111)
Creatinine, Ser: 1 mg/dL (ref 0.61–1.24)
Glucose, Bld: 104 mg/dL — ABNORMAL HIGH (ref 70–99)
HCT: 44 % (ref 39.0–52.0)
Hemoglobin: 15 g/dL (ref 13.0–17.0)
Potassium: 3.7 mmol/L (ref 3.5–5.1)
Sodium: 142 mmol/L (ref 135–145)
TCO2: 30 mmol/L (ref 22–32)

## 2020-04-26 LAB — COMPREHENSIVE METABOLIC PANEL
ALT: 28 U/L (ref 0–44)
AST: 32 U/L (ref 15–41)
Albumin: 4.1 g/dL (ref 3.5–5.0)
Alkaline Phosphatase: 53 U/L (ref 38–126)
Anion gap: 8 (ref 5–15)
BUN: 12 mg/dL (ref 6–20)
CO2: 28 mmol/L (ref 22–32)
Calcium: 9.7 mg/dL (ref 8.9–10.3)
Chloride: 104 mmol/L (ref 98–111)
Creatinine, Ser: 1.03 mg/dL (ref 0.61–1.24)
GFR, Estimated: >60 mL/min (ref >60)
Glucose, Bld: 106 mg/dL — ABNORMAL HIGH (ref 70–99)
Potassium: 3.6 mmol/L (ref 3.5–5.1)
Sodium: 140 mmol/L (ref 135–145)
Total Bilirubin: 0.8 mg/dL (ref 0.3–1.2)
Total Protein: 7.7 g/dL (ref 6.5–8.1)

## 2020-04-26 LAB — DIFFERENTIAL
Abs Immature Granulocytes: 0.02 10*3/uL (ref 0.00–0.07)
Basophils Absolute: 0 10*3/uL (ref 0.0–0.1)
Basophils Relative: 0 %
Eosinophils Absolute: 0.2 10*3/uL (ref 0.0–0.5)
Eosinophils Relative: 3 %
Immature Granulocytes: 0 %
Lymphocytes Relative: 18 %
Lymphs Abs: 1.2 10*3/uL (ref 0.7–4.0)
Monocytes Absolute: 0.5 10*3/uL (ref 0.1–1.0)
Monocytes Relative: 7 %
Neutro Abs: 4.9 10*3/uL (ref 1.7–7.7)
Neutrophils Relative %: 72 %

## 2020-04-26 LAB — CBC
HCT: 42.4 % (ref 39.0–52.0)
Hemoglobin: 14.5 g/dL (ref 13.0–17.0)
MCH: 28.7 pg (ref 26.0–34.0)
MCHC: 34.2 g/dL (ref 30.0–36.0)
MCV: 83.8 fL (ref 80.0–100.0)
Platelets: 292 10*3/uL (ref 150–400)
RBC: 5.06 MIL/uL (ref 4.22–5.81)
RDW: 14.4 % (ref 11.5–15.5)
WBC: 6.8 10*3/uL (ref 4.0–10.5)
nRBC: 0 % (ref 0.0–0.2)

## 2020-04-26 LAB — APTT: aPTT: 28 seconds (ref 24–36)

## 2020-04-26 IMAGING — MR MR HEAD W/O CM
6 of 10 series · 27 of 48 positions shown · non-contrast
Comparison: CT angiogram head/neck [DATE].

CLINICAL DATA: Neuro deficit, acute, stroke suspected. Additional
history provided: Patient reports dizziness, left hand numbness,
weakness of left, slurred speech, gait feels "off."

EXAM:
MRI HEAD WITHOUT CONTRAST
TECHNIQUE: Multiplanar, multiecho pulse sequences of the brain and surrounding
structures were obtained without intravenous contrast.

[Series 2: DWI · axial · 3.0mm · 0.94mm/px · z∈[-169,-31]mm · 8 of 100 slices shown (1 of 2)]
[im 1/100]
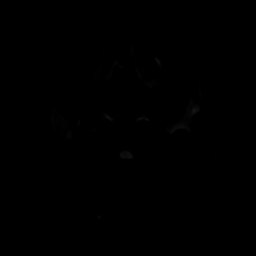
[im 12/100]
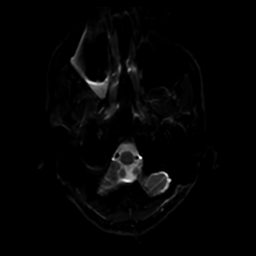
[im 34/100]
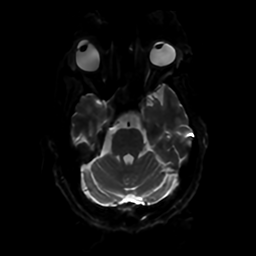
[im 45/100]
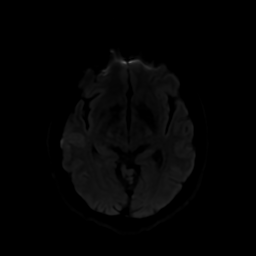
[im 56/100]
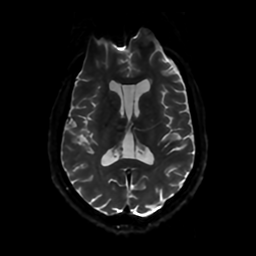
[im 67/100]
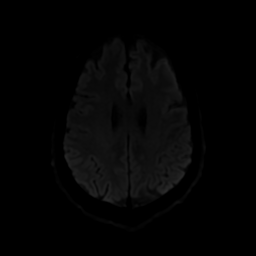
[im 89/100]
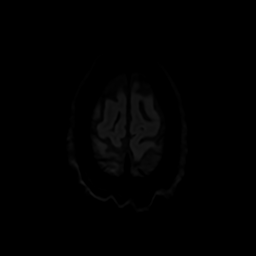
[im 100/100]
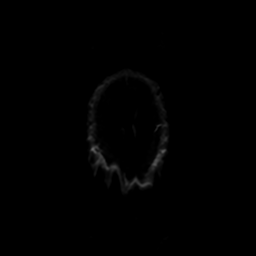

[Series 3: DWI · coronal · 4.0mm · 0.94mm/px · 8 of 74 slices shown (2 of 2)]
[im 1/74]
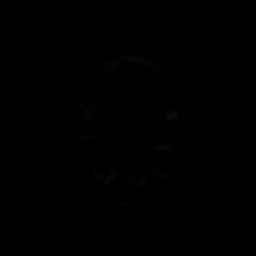
[im 11/74]
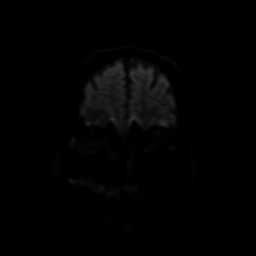
[im 21/74]
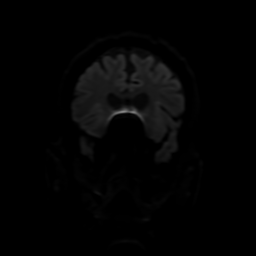
[im 32/74]
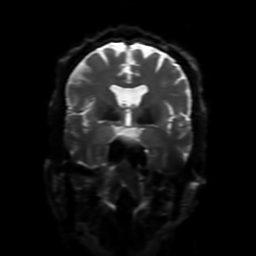
[im 42/74]
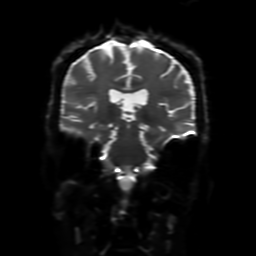
[im 53/74]
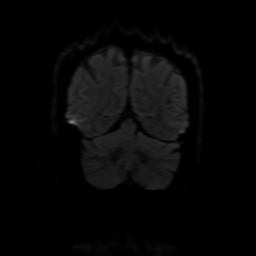
[im 63/74]
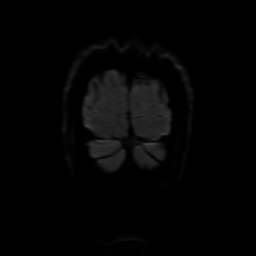
[im 74/74]
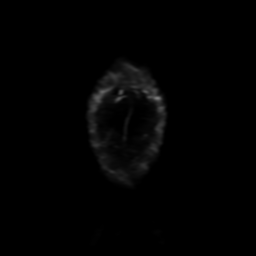

[Series 4: FLAIR · sagittal · 5.0mm · 0.23mm/px · 2 of 23 slices shown (1 of 2)]
[im 1/23]
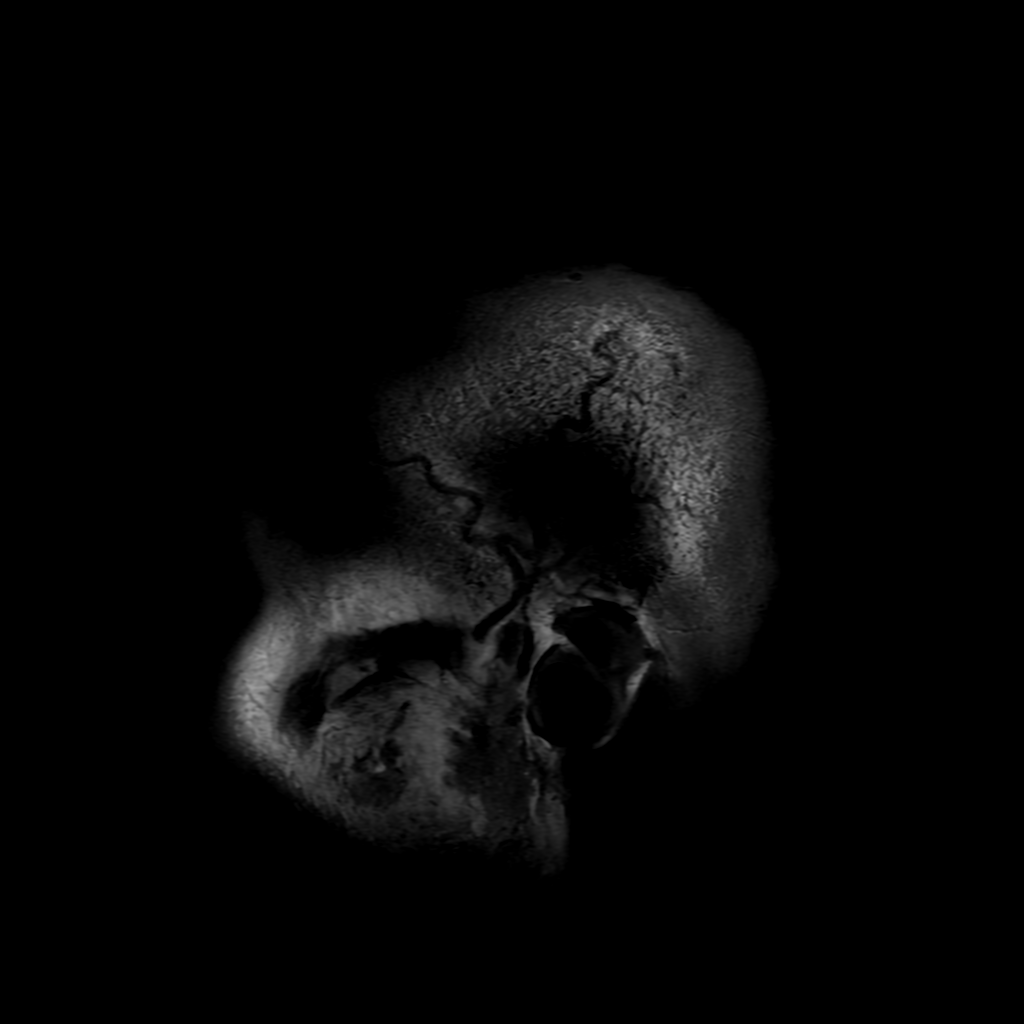
[im 23/23]
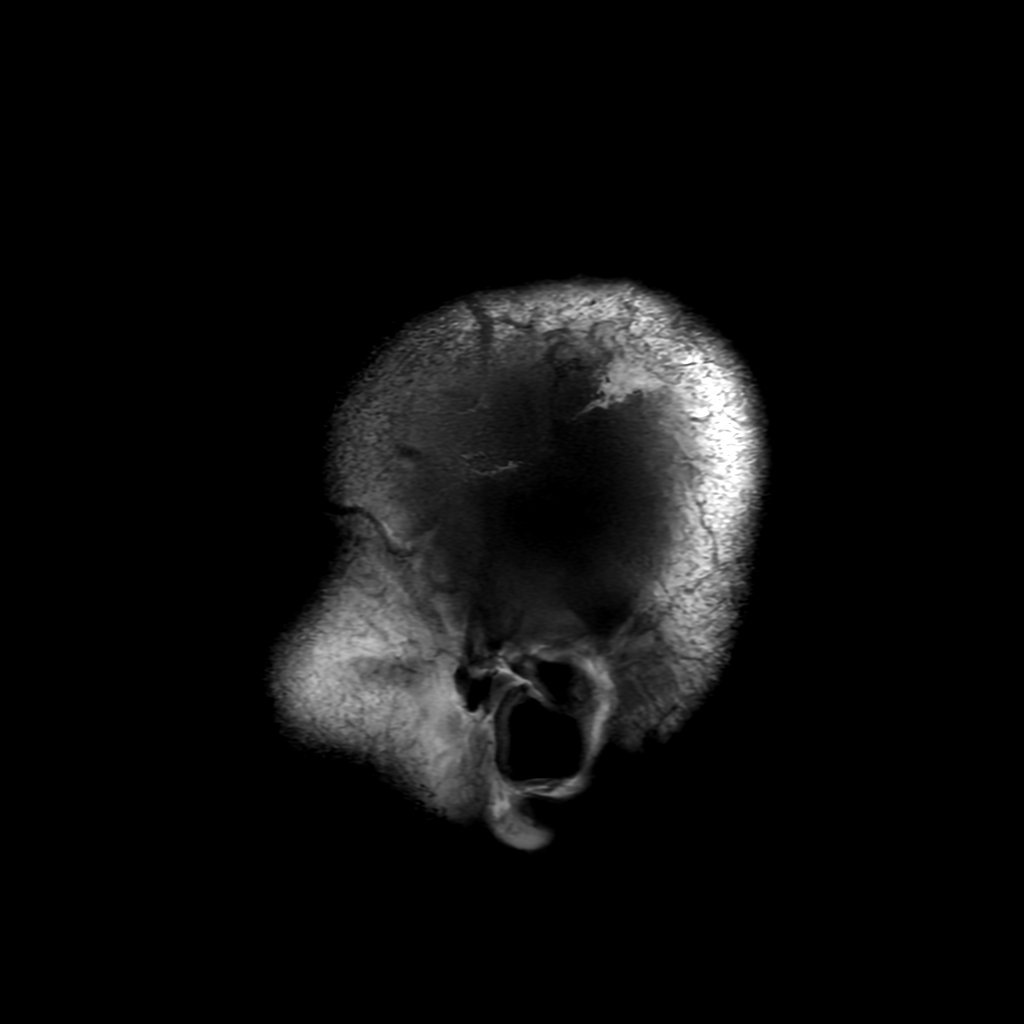

[Series 6: FLAIR · axial · 3.0mm · 0.45mm/px · z∈[-167,-33]mm · 2 of 25 slices shown (2 of 2)]
[im 1/25]
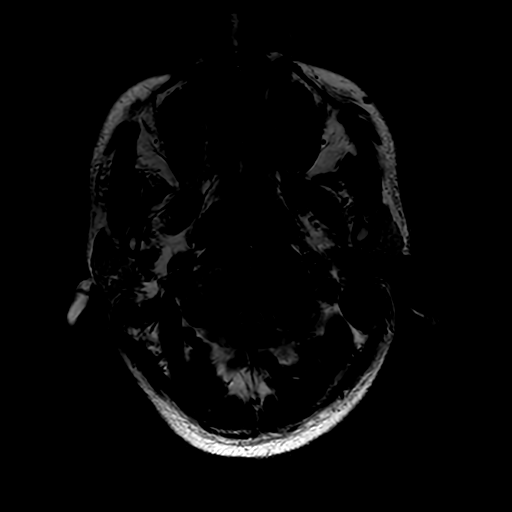
[im 25/25]
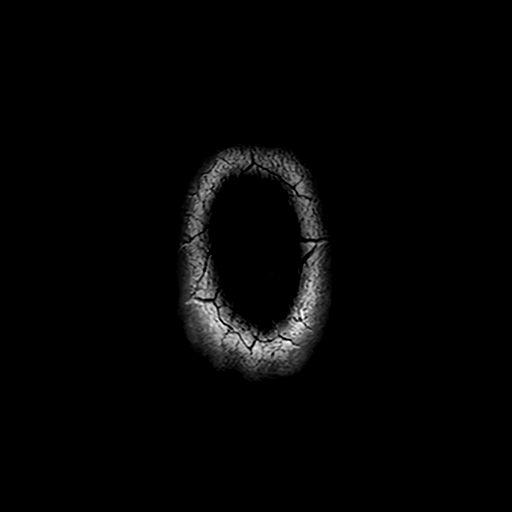

[Series 250: ADC · axial · 3.0mm · 0.94mm/px · z∈[-169,-31]mm · 4 of 48 slices shown (1 of 2)]
[im 1/48]
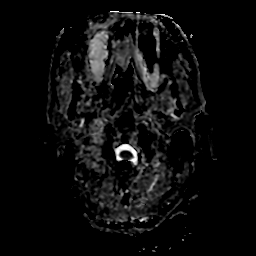
[im 16/48]
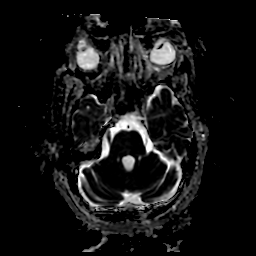
[im 32/48]
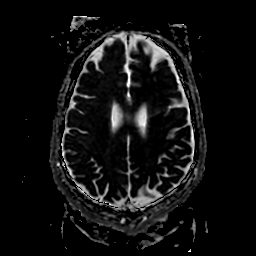
[im 48/48]
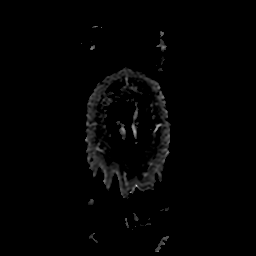

[Series 350: ADC · coronal · 4.0mm · 0.94mm/px · 3 of 37 slices shown (2 of 2)]
[im 1/37]
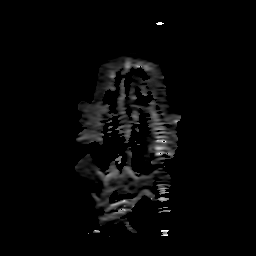
[im 19/37]
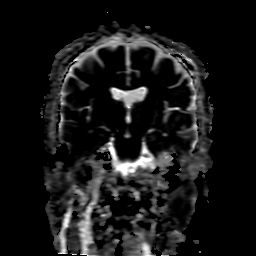
[im 37/37]
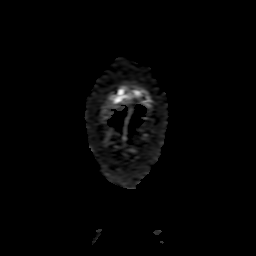

[27 of 48 positions shown; findings below may reference images not displayed]

FINDINGS: Brain:

Cerebral volume is normal for age.

Minimal multifocal T2/FLAIR hyperintensity within the cerebral white
matter is nonspecific, but compatible with chronic small vessel
ischemic disease.

There is no acute infarct.

No evidence of intracranial mass.

No chronic intracranial blood products.

No extra-axial fluid collection.

No midline shift.

Vascular: Expected proximal arterial flow voids.

Skull and upper cervical spine: No focal marrow lesion.

Sinuses/Orbits: Visualized orbits show no acute finding. Paranasal
sinus disease most notably as follows. Moderate mucosal thickening
within the inferior left frontal and bilateral ethmoid sinuses.
Moderate volume fluid and moderate mucosal thickening within the
right maxillary sinus. Small volume fluid and mild mucosal
thickening within the left maxillary sinus.
IMPRESSION: No evidence of acute intracranial abnormality.

Mild cerebral white matter chronic small vessel ischemic disease.

Paranasal sinus disease as described. Correlate for acute sinusitis.

## 2020-04-26 MED ORDER — SODIUM CHLORIDE 0.9% FLUSH: 3.0000 mL | Freq: Once | INTRAVENOUS | Status: DC

## 2020-04-26 MED ORDER — MECLIZINE HCL 25 MG PO TABS: 25.0000 mg | ORAL_TABLET | Freq: Three times a day (TID) | ORAL | 0 refills | Status: AC

## 2020-04-26 MED ORDER — MECLIZINE HCL 25 MG PO TABS
25.0000 mg | ORAL_TABLET | Freq: Once | ORAL | Status: AC
  Administered 2020-04-26: 25 mg via ORAL
  Filled 2020-04-26: qty 1

## 2020-04-26 NOTE — ED Provider Notes (Signed)
[facility]    CSN: [id number] Arrival date & time: 04/26/20  1003      History   Chief Complaint Chief Complaint  Patient presents with  . Dizziness  . Numbness    HPI Joseph Carr is a 50 y.o. male.   Patient presents with dizziness, left hand numbness, weakness of left arm, slurred speech, gait feels "off" x3 to 4 days.  He also reports bilateral elbow pain.  No falls or injury.  He denies chest pain, shortness of breath, focal weakness, or other symptoms.  No treatments attempted at home.  His medical history includes hypertension, CAD, coronary stent, hypertensive heart disease, mitral valve prolapse, hyperlipidemia, tobacco abuse, habitual alcohol use.  The history is provided by the patient and medical records.    Past Medical History:  Diagnosis Date  . At risk for noncompliance   . CAD (coronary artery disease)    a. NSTEMI 10/2013 - DES to ramus intermedius.  b. 06/2014 Cath/PCI: LM nl, LAD 15p, RI patent stent prox, 5m (2.5x12 Promus Premier DES), LCX 40p, EF 55-65%.  . Habitual alcohol use   . Hyperlipidemia LDL goal <70   . Hypertension   . Hypertensive heart disease    mild LVH on MRI with no HOCM  . Low TSH level   . MVP (mitral valve prolapse)   . Tobacco abuse     Patient Active Problem List   Diagnosis Date Noted  . Hypertensive heart disease   . Hyperthyroidism 02/28/2019  . Tobacco abuse 10/30/2013  . CAD (coronary artery disease) 10/21/2013    Class: Acute  . Presence of drug coated stent in Circumflex-Ramus Intermedius branch: Promus DES 2.5 mm x 20 mm (2.75 mm) placed 10/20/13 10/21/2013  . Essential hypertension     Class: Chronic  . Hyperlipidemia LDL goal <70     Past Surgical History:  Procedure Laterality Date  . CARDIAC CATHETERIZATION N/A 07/03/2014   Procedure: Left Heart Cath and Coronary Angiography;  Surgeon: Peter M Swaziland, MD;  Location: [facility] INVASIVE CV LAB;  Service: Cardiovascular;  Laterality: N/A;  . CARDIAC  CATHETERIZATION N/A 07/03/2014   Procedure: Coronary Stent Intervention;  Surgeon: Peter M Swaziland, MD;  Location: [facility] INVASIVE CV LAB;  Service: Cardiovascular;  Laterality: N/A;  . CORONARY ANGIOPLASTY    . FINGER FRACTURE SURGERY Left 2014   4th finger  . LEFT HEART CATHETERIZATION WITH CORONARY ANGIOGRAM N/A 10/20/2013   Procedure: LEFT HEART CATHETERIZATION WITH CORONARY ANGIOGRAM;  Surgeon: Corky Crafts, MD;  Location: [facility] CATH LAB;  Service: Cardiovascular;  Laterality: N/A;  . PERCUTANEOUS CORONARY STENT INTERVENTION (PCI-S)  07/03/2014   RAMUS         Home Medications    Prior to Admission medications   Medication Sig Start Date End Date Taking? Authorizing Provider  aspirin EC 81 MG tablet Take 1 tablet (81 mg total) by mouth daily. 10/26/15   Quintella Reichert, MD  atorvastatin (LIPITOR) 80 MG tablet TAKE ONE TABLET BY MOUTH ONCE DAILY AT  6PM 12/06/18   Laurann Montana, PA-C  carvedilol (COREG) 6.25 MG tablet Take 1 tablet (6.25 mg total) by mouth 2 (two) times daily with a meal. 09/01/19   Turner, Cornelious Bryant, MD  chlorthalidone (HYGROTON) 25 MG tablet Take 1 tablet (25 mg total) by mouth daily. 09/18/19   Quintella Reichert, MD  cyclobenzaprine (FLEXERIL) 5 MG tablet Take 1 tablet (5 mg total) by mouth 3 (three) times daily as needed for  muscle spasms. 02/02/20   Evern Core, PA-C  Evolocumab (REPATHA SURECLICK) 140 MG/ML SOAJ Inject 140 mg into the skin every 14 (fourteen) days. 10/22/19   Quintella Reichert, MD  ibuprofen (ADVIL) 800 MG tablet Take 1 tablet (800 mg total) by mouth 3 (three) times daily. 11/27/19   Eustace Moore, MD  losartan (COZAAR) 100 MG tablet Take 1 tablet (100 mg total) by mouth daily. 09/01/19   Quintella Reichert, MD  methimazole (TAPAZOLE) 5 MG tablet Take 1 tablet by mouth once daily 11/07/19   Romero Belling, MD  naproxen (NAPROSYN) 500 MG tablet Take 1 tablet (500 mg total) by mouth 2 (two) times daily. 02/02/20   Evern Core, PA-C  nitroGLYCERIN  (NITROSTAT) 0.4 MG SL tablet DISSOLVE ONE TABLET UNDER THE TONGUE EVERY 5 MINUTES AS NEEDED FOR CHEST PAIN.  DO NOT EXCEED A TOTAL OF 3 DOSES IN 15 MINUTES 12/06/18   Dunn, Dayna N, PA-C  tiZANidine (ZANAFLEX) 4 MG tablet Take 1-2 tablets (4-8 mg total) by mouth every 6 (six) hours as needed for muscle spasms. 11/27/19   Eustace Moore, MD  traMADol (ULTRAM) 50 MG tablet Take 1 tablet (50 mg total) by mouth every 6 (six) hours as needed. 11/27/19   Eustace Moore, MD    Family History Family History  Problem Relation Age of Onset  . Heart attack Father   . Hypertension Father   . Stroke Father   . Thyroid disease Neg Hx     Social History Social History   Tobacco Use  . Smoking status: Light Tobacco Smoker    Packs/day: 0.75    Years: 30.00    Pack years: 22.50    Types: Cigarettes    Last attempt to quit: 07/03/2014    Years since quitting: 5.8  . Smokeless tobacco: Never Used  Vaping Use  . Vaping Use: Never used  Substance Use Topics  . Alcohol use: Yes  . Drug use: No     Allergies   Ace inhibitors   Review of Systems Review of Systems  Constitutional: Negative for chills and fever.  HENT: Negative for ear pain and sore throat.   Eyes: Negative for pain and visual disturbance.  Respiratory: Negative for cough and shortness of breath.   Cardiovascular: Negative for chest pain and palpitations.  Gastrointestinal: Negative for abdominal pain and vomiting.  Genitourinary: Negative for dysuria and hematuria.  Musculoskeletal: Positive for arthralgias. Negative for back pain.  Skin: Negative for color change and rash.  Neurological: Positive for dizziness, speech difficulty, weakness, light-headedness and numbness. Negative for seizures, syncope, facial asymmetry and headaches.  All other systems reviewed and are negative.    Physical Exam Triage Vital Signs ED Triage Vitals  Enc Vitals Group     BP      Pulse      Resp      Temp      Temp src       SpO2      Weight      Height      Head Circumference      Peak Flow      Pain Score      Pain Loc      Pain Edu?      Excl. in GC?    No data found.  Updated Vital Signs BP (!) 164/90   Pulse (!) 104   Temp 99.1 F (37.3 C)   Resp 19   SpO2 98%  Visual Acuity Right Eye Distance:   Left Eye Distance:   Bilateral Distance:    Right Eye Near:   Left Eye Near:    Bilateral Near:     Physical Exam Vitals and nursing note reviewed.  Constitutional:      General: He is not in acute distress.    Appearance: He is well-developed.  HENT:     Head: Normocephalic and atraumatic.     Mouth/Throat:     Mouth: Mucous membranes are moist.  Eyes:     Conjunctiva/sclera: Conjunctivae normal.  Cardiovascular:     Rate and Rhythm: Normal rate and regular rhythm.     Heart sounds: Normal heart sounds.  Pulmonary:     Effort: Pulmonary effort is normal. No respiratory distress.     Breath sounds: Normal breath sounds.  Abdominal:     Palpations: Abdomen is soft.     Tenderness: There is no abdominal tenderness.  Musculoskeletal:     Cervical back: Neck supple.  Skin:    General: Skin is warm and dry.  Neurological:     General: No focal deficit present.     Mental Status: He is alert and oriented to person, place, and time.     Cranial Nerves: No cranial nerve deficit.     Sensory: No sensory deficit.     Motor: No weakness.     Gait: Gait normal.  Psychiatric:        Mood and Affect: Mood normal.        Behavior: Behavior normal.      UC Treatments / Results  Labs (all labs ordered are listed, but only abnormal results are displayed) Labs Reviewed - No data to display  EKG   Radiology No results found.  Procedures Procedures (including critical care time)  Medications Ordered in UC Medications - No data to display  Initial Impression / Assessment and Plan / UC Course  I have reviewed the triage vital signs and the nursing notes.  Pertinent labs &  imaging results that were available during my care of the patient were reviewed by me and considered in my medical decision making (see chart for details).   Dizziness, weakness, numbness of left hand.  EKG shows sinus rhythm, rate 100, no ST elevation, compared to previous from 2020.  Sending patient to the ED for evaluation.  He declines EMS and states he feels stable to transport himself there.   Final Clinical Impressions(s) / UC Diagnoses   Final diagnoses:  Dizziness  Numbness of left hand  Weakness     Discharge Instructions     Go to the emergency department for evaluation of your dizziness, numbness, weakness.    ED Prescriptions    None     PDMP not reviewed this encounter.   Mickie Bail, NP 04/26/20 1037

## 2020-04-26 NOTE — ED Triage Notes (Addendum)
Pt in with c/o left hand numbness, dizziness and  Slurred speech that started on Friday. States he also feels like his gait has been off.   Pt also c/o bilateral elbow pain  Denies CP

## 2020-04-26 NOTE — ED Notes (Signed)
Patient is being discharged from the Urgent Care and sent to the Emergency Department via POV . Per Wendee Beavers, NP patient is in need of higher level of care due to left hand numbness & dizziness. Patient is aware and verbalizes understanding of plan of care.  Vitals:   04/26/20 1018  BP: (!) 164/90  Pulse: (!) 104  Resp: 19  Temp: 99.1 F (37.3 C)  SpO2: 98%

## 2020-04-26 NOTE — Discharge Instructions (Signed)
As discussed, your evaluation today has been largely reassuring.  But, it is important that you monitor your condition carefully, and do not hesitate to return to the ED if you develop new, or concerning changes in your condition.  Otherwise, please follow-up with your physician for appropriate ongoing care.  Addition, follow-up with our neurology colleagues if your dizziness is persistent in spite of your new medication.

## 2020-04-26 NOTE — ED Provider Notes (Signed)
[facility] Provider Note   CSN: [id number] Arrival date & time: 04/26/20  1044     History Chief Complaint  Patient presents with  . Dizziness    Johna RolesJames M Simons is a 50 y.o. male.  HPI Patient presents with dizziness, described as unsteadiness and new left arm weakness. Onset was 3 days ago, in the morning.  Initially he had profound gait difficulty.  Subsequently developed left arm weakness.  There was some word finding difficulty for an unspecified period of time.  The aphasia has resolved, left arm weakness has improved, but the patient continues to have gait difficulty.  No pain, no confusion, no disorientation. He has multiple medical issues, including CAD with prior stent placement.  He is no longer taking Plavix, but continues to take aspirin daily. Since onset no clear alleviating, exacerbating factors. Patient was seen and evaluated at urgent care, sent here for concern of his gait instability. Additional details available per chart review.    Past Medical History:  Diagnosis Date  . At risk for noncompliance   . CAD (coronary artery disease)    a. NSTEMI 10/2013 - DES to ramus intermedius.  b. 06/2014 Cath/PCI: LM nl, LAD 15p, RI patent stent prox, 8029m (2.5x12 Promus Premier DES), LCX 40p, EF 55-65%.  . Habitual alcohol use   . Hyperlipidemia LDL goal <70   . Hypertension   . Hypertensive heart disease    mild LVH on MRI with no HOCM  . Low TSH level   . MVP (mitral valve prolapse)   . Tobacco abuse     Patient Active Problem List   Diagnosis Date Noted  . Hypertensive heart disease   . Hyperthyroidism 02/28/2019  . Tobacco abuse 10/30/2013  . CAD (coronary artery disease) 10/21/2013    Class: Acute  . Presence of drug coated stent in Circumflex-Ramus Intermedius branch: Promus DES 2.5 mm x 20 mm (2.75 mm) placed 10/20/13 10/21/2013  . Essential hypertension     Class: Chronic  . Hyperlipidemia LDL goal <70     Past  Surgical History:  Procedure Laterality Date  . CARDIAC CATHETERIZATION N/A 07/03/2014   Procedure: Left Heart Cath and Coronary Angiography;  Surgeon: Peter M SwazilandJordan, MD;  [facility];  Service: Cardiovascular;  Laterality: N/A;  . CARDIAC CATHETERIZATION N/A 07/03/2014   Procedure: Coronary Stent Intervention;  Surgeon: Peter M SwazilandJordan, MD;  [facility];  Service: Cardiovascular;  Laterality: N/A;  . CORONARY ANGIOPLASTY    . FINGER FRACTURE SURGERY Left 2014   4th finger  . LEFT HEART CATHETERIZATION WITH CORONARY ANGIOGRAM N/A 10/20/2013   Procedure: LEFT HEART CATHETERIZATION WITH CORONARY ANGIOGRAM;  Surgeon: Corky CraftsJayadeep S Varanasi, MD;  [facility];  Service: Cardiovascular;  Laterality: N/A;  . PERCUTANEOUS CORONARY STENT INTERVENTION (PCI-S)  07/03/2014   RAMUS         Family History  Problem Relation Age of Onset  . Heart attack Father   . Hypertension Father   . Stroke Father   . Thyroid disease Neg Hx     Social History   Tobacco Use  . Smoking status: Light Tobacco Smoker    Packs/day: 0.75    Years: 30.00    Pack years: 22.50    Types: Cigarettes    Last attempt to quit: 07/03/2014    Years since quitting: 5.8  . Smokeless tobacco: Never Used  Vaping Use  . Vaping Use: Never used  Substance  Use Topics  . Alcohol use: Yes  . Drug use: No    Home Medications Prior to Admission medications   Medication Sig Start Date End Date Taking? Authorizing Provider  aspirin EC 81 MG tablet Take 1 tablet (81 mg total) by mouth daily. 10/26/15   Quintella Reichert, MD  atorvastatin (LIPITOR) 80 MG tablet TAKE ONE TABLET BY MOUTH ONCE DAILY AT  6PM 12/06/18   Laurann Montana, PA-C  carvedilol (COREG) 6.25 MG tablet Take 1 tablet (6.25 mg total) by mouth 2 (two) times daily with a meal. 09/01/19   Turner, Cornelious Bryant, MD  chlorthalidone (HYGROTON) 25 MG tablet Take 1 tablet (25 mg total) by mouth daily. 09/18/19   Quintella Reichert, MD   cyclobenzaprine (FLEXERIL) 5 MG tablet Take 1 tablet (5 mg total) by mouth 3 (three) times daily as needed for muscle spasms. 02/02/20   Evern Core, PA-C  Evolocumab (REPATHA SURECLICK) 140 MG/ML SOAJ Inject 140 mg into the skin every 14 (fourteen) days. 10/22/19   Quintella Reichert, MD  ibuprofen (ADVIL) 800 MG tablet Take 1 tablet (800 mg total) by mouth 3 (three) times daily. 11/27/19   Eustace Moore, MD  losartan (COZAAR) 100 MG tablet Take 1 tablet (100 mg total) by mouth daily. 09/01/19   Quintella Reichert, MD  methimazole (TAPAZOLE) 5 MG tablet Take 1 tablet by mouth once daily 11/07/19   Romero Belling, MD  naproxen (NAPROSYN) 500 MG tablet Take 1 tablet (500 mg total) by mouth 2 (two) times daily. 02/02/20   Evern Core, PA-C  nitroGLYCERIN (NITROSTAT) 0.4 MG SL tablet DISSOLVE ONE TABLET UNDER THE TONGUE EVERY 5 MINUTES AS NEEDED FOR CHEST PAIN.  DO NOT EXCEED A TOTAL OF 3 DOSES IN 15 MINUTES 12/06/18   Dunn, Dayna N, PA-C  tiZANidine (ZANAFLEX) 4 MG tablet Take 1-2 tablets (4-8 mg total) by mouth every 6 (six) hours as needed for muscle spasms. 11/27/19   Eustace Moore, MD  traMADol (ULTRAM) 50 MG tablet Take 1 tablet (50 mg total) by mouth every 6 (six) hours as needed. 11/27/19   Eustace Moore, MD    Allergies    Ace inhibitors  Review of Systems   Review of Systems  Constitutional:       Per HPI, otherwise negative  HENT:       Per HPI, otherwise negative  Respiratory:       Per HPI, otherwise negative  Cardiovascular:       Per HPI, otherwise negative  Gastrointestinal: Negative for vomiting.  Endocrine:       Negative aside from HPI  Genitourinary:       Neg aside from HPI   Musculoskeletal:       Per HPI, otherwise negative  Skin: Negative.   Neurological: Positive for dizziness, weakness and light-headedness. Negative for syncope and headaches.    Physical Exam Updated Vital Signs BP (!) 153/94 (BP Location: Right Arm)   Pulse 95   Temp 98.1  F (36.7 C) (Oral)   Resp (!) 21   SpO2 99%   Physical Exam Vitals and nursing note reviewed.  Constitutional:      General: He is not in acute distress.    Appearance: He is well-developed.  HENT:     Head: Normocephalic and atraumatic.  Eyes:     Conjunctiva/sclera: Conjunctivae normal.  Cardiovascular:     Rate and Rhythm: Normal rate and regular rhythm.  Pulmonary:     Effort:  Pulmonary effort is normal. No respiratory distress.     Breath sounds: No stridor.  Abdominal:     General: There is no distension.  Skin:    General: Skin is warm and dry.  Neurological:     Mental Status: He is alert and oriented to person, place, and time.     Cranial Nerves: No dysarthria.     Motor: No tremor.     Comments: 4+/5 strength left upper extremity, 5/5 right Lower extremities symmetric 5/5 No facial asymmetry, speech is clear.     ED Results / Procedures / Treatments   Labs (all labs ordered are listed, but only abnormal results are displayed) Labs Reviewed  COMPREHENSIVE METABOLIC PANEL - Abnormal; Notable for the following components:      Result Value   Glucose, Bld 106 (*)    All other components within normal limits  I-STAT CHEM 8, ED - Abnormal; Notable for the following components:   Glucose, Bld 104 (*)    All other components within normal limits  PROTIME-INR  APTT  CBC  DIFFERENTIAL  CBG MONITORING, ED    EKG None  Radiology MR Brain Wo Contrast (neuro protocol)  Result Date: 04/26/2020 CLINICAL DATA:  Neuro deficit, acute, stroke suspected. Additional history provided: Patient reports dizziness, left hand numbness, weakness of left, slurred speech, gait feels "off." EXAM: MRI HEAD WITHOUT CONTRAST TECHNIQUE: Multiplanar, multiecho pulse sequences of the brain and surrounding structures were obtained without intravenous contrast. COMPARISON:  CT angiogram head/neck 02/11/2014. FINDINGS: Brain: Cerebral volume is normal for age. Minimal multifocal T2/FLAIR  hyperintensity within the cerebral white matter is nonspecific, but compatible with chronic small vessel ischemic disease. There is no acute infarct. No evidence of intracranial mass. No chronic intracranial blood products. No extra-axial fluid collection. No midline shift. Vascular: Expected proximal arterial flow voids. Skull and upper cervical spine: No focal marrow lesion. Sinuses/Orbits: Visualized orbits show no acute finding. Paranasal sinus disease most notably as follows. Moderate mucosal thickening within the inferior left frontal and bilateral ethmoid sinuses. Moderate volume fluid and moderate mucosal thickening within the right maxillary sinus. Small volume fluid and mild mucosal thickening within the left maxillary sinus. IMPRESSION: No evidence of acute intracranial abnormality. Mild cerebral white matter chronic small vessel ischemic disease. Paranasal sinus disease as described. Correlate for acute sinusitis. Electronically Signed   By: Jackey Loge DO   On: 04/26/2020 14:26     Medications Ordered in ED Medications  sodium chloride flush (NS) 0.9 % injection 3 mL (has no administration in time range)    ED Course  I have reviewed the triage vital signs and the nursing notes.  Pertinent labs & imaging results that were available during my care of the patient were reviewed by me and considered in my medical decision making (see chart for details). 3:49 PM On repeat exam patient is awake, alert, sitting upright.  He and I discussed today's findings, including MRI which I reviewed. No MRI evidence for acute stroke.  We did conversation about occult stroke versus TIA versus vertigo.  When described as being an unsteady sensation, with motion, patient notes that that is actually feeling.  On gait testing now patient has an unremarkable gait, with no appreciable difficulty.  No nausea, no vomiting, no ongoing weakness.  He and I discussed possibilities of occult stroke versus TIA as well.   With consideration of vertigo patient amenable to starting meclizine, following up with neurology and primary care, after conversation on return precautions  and possible need for additional evaluation.   MDM Rules/Calculators/A&P MDM Number of Diagnoses or Management Options Dizziness: new, needed workup   Amount and/or Complexity of Data Reviewed Clinical lab tests: reviewed Tests in the radiology section of CPT: reviewed Tests in the medicine section of CPT: reviewed Decide to obtain previous medical records or to obtain history from someone other than the patient: yes Review and summarize past medical records: yes Discuss the patient with other providers: yes Independent visualization of images, tracings, or specimens: yes  Risk of Complications, Morbidity, and/or Mortality Presenting problems: high Diagnostic procedures: high Management options: high  Critical Care Total time providing critical care: < 30 minutes  Patient Progress Patient progress: stable  Final Clinical Impression(s) / ED Diagnoses Final diagnoses:  Dizziness    Rx / DC Orders ED Discharge Orders         Ordered    meclizine (ANTIVERT) 25 MG tablet  3 times daily        04/26/20 1552           Gerhard Munch, MD 04/26/20 1553

## 2020-04-26 NOTE — Discharge Instructions (Signed)
Go to the emergency department for evaluation of your dizziness, numbness, weakness.

## 2020-04-26 NOTE — ED Triage Notes (Signed)
Pt reports he woke up Friday feeling dizzy and off balance around 10am he noticed he was having slurred speech which lasted until 2pm and then resolved. The dizziness has stayed since Friday and feels like he is walking "side ways" feels like he is lending to right. He has decreased sensation to left arm, no drift. Vision seems blurry at times and he was felt just weak all over since Friday.

## 2020-04-26 NOTE — ED Notes (Signed)
Patient transported to CT 

## 2020-07-08 ENCOUNTER — Other Ambulatory Visit: Payer: Self-pay | Admitting: Endocrinology

## 2020-07-08 ENCOUNTER — Other Ambulatory Visit: Payer: Self-pay | Admitting: Physician Assistant

## 2020-09-13 ENCOUNTER — Other Ambulatory Visit: Payer: Self-pay

## 2020-09-13 ENCOUNTER — Encounter (HOSPITAL_COMMUNITY): Payer: Self-pay

## 2020-09-13 ENCOUNTER — Ambulatory Visit (HOSPITAL_COMMUNITY)
Admission: EM | Admit: 2020-09-13 | Discharge: 2020-09-13 | Disposition: A | Discharge status: Home / Self Care | Payer: No Typology Code available for payment source | Attending: Emergency Medicine | Admitting: Emergency Medicine

## 2020-09-13 DIAGNOSIS — J069 Acute upper respiratory infection, unspecified: Secondary | ICD-10-CM | POA: Diagnosis not present

## 2020-09-13 DIAGNOSIS — Z20822 Contact with and (suspected) exposure to covid-19: Secondary | ICD-10-CM | POA: Insufficient documentation

## 2020-09-13 LAB — SARS CORONAVIRUS 2 (TAT 6-24 HRS): SARS Coronavirus 2: NEGATIVE

## 2020-09-13 MED ORDER — FLUTICASONE PROPIONATE 50 MCG/ACT NA SUSP: 2.0000 | Freq: Every day | NASAL | 0 refills | Status: DC

## 2020-09-13 MED ORDER — IBUPROFEN 600 MG PO TABS: 600.0000 mg | ORAL_TABLET | Freq: Four times a day (QID) | ORAL | 0 refills | Status: DC | PRN

## 2020-09-13 MED ORDER — BENZONATATE 200 MG PO CAPS: 200.0000 mg | ORAL_CAPSULE | Freq: Three times a day (TID) | ORAL | 0 refills | Status: DC | PRN

## 2020-09-13 NOTE — ED Triage Notes (Signed)
Pt presents with c/o chills, body aches, loss of taste x 3 days.

## 2020-09-13 NOTE — ED Provider Notes (Signed)
HPI  SUBJECTIVE:  Joseph Carr is a 50 y.o. male who presents with body aches, rhinorrhea, sore throat, postnasal drip, cough, chest soreness secondary to the cough, wheezing last night, loss of sense of taste and smell starting yesterday.  No fevers, headaches, nasal congestion, shortness of breath, nausea, vomiting, diarrhea, abdominal pain.  No Flu or COVID exposure.  He got the COVID booster.  States that he was able to sleep last night secondary to the cough.  No antibiotics in the past month.  No Antipyretic in the past 6 hours.  He tried NyQuil and Vicks without improvement in his symptoms.  No alleviating aggravating factors.  He has a past medical history of coronary artery disease, is status post NSTEMI and stent placement, has a history of hypercholesterolemia, hypertension, mitral valve prolapse and is a smoker.  [id number], Rupashree, MD   Past Medical History:  Diagnosis Date   At risk for noncompliance    CAD (coronary artery disease)    a. NSTEMI 10/2013 - DES to ramus intermedius.  b. 06/2014 Cath/PCI: LM nl, LAD 15p, RI patent stent prox, 35m (2.5x12 Promus Premier DES), LCX 40p, EF 55-65%.   Habitual alcohol use    Hyperlipidemia LDL goal <70    Hypertension    Hypertensive heart disease    mild LVH on MRI with no HOCM   Low TSH level    MVP (mitral valve prolapse)    Tobacco abuse     Past Surgical History:  Procedure Laterality Date   CARDIAC CATHETERIZATION N/A 07/03/2014   Procedure: Left Heart Cath and Coronary Angiography;  Surgeon: Peter M Swaziland, MD;  Location[facility] INVASIVE CV LAB;  Service: Cardiovascular;  Laterality: N/A;   CARDIAC CATHETERIZATION N/A 07/03/2014   Procedure: Coronary Stent Intervention;  Surgeon: Peter M Swaziland, MD;  Location: [facility] INVASIVE CV LAB;  Service: Cardiovascular;  Laterality: N/A;   CORONARY ANGIOPLASTY     FINGER FRACTURE SURGERY Left 2014   4th finger   LEFT HEART CATHETERIZATION WITH CORONARY ANGIOGRAM N/A 10/20/2013    Procedure: LEFT HEART CATHETERIZATION WITH CORONARY ANGIOGRAM;  Surgeon: Corky Crafts, MD;  Location[facility] CATH LAB;  Service: Cardiovascular;  Laterality: N/A;   PERCUTANEOUS CORONARY STENT INTERVENTION (PCI-S)  07/03/2014   RAMUS      Family History  Problem Relation Age of Onset   Heart attack Father    Hypertension Father    Stroke Father    Thyroid disease Neg Hx     Social History   Tobacco Use   Smoking status: Light Smoker    Packs/day: 0.75    Years: 30.00    Pack years: 22.50    Types: Cigarettes    Last attempt to quit: 07/03/2014    Years since quitting: 6.2   Smokeless tobacco: Never  Vaping Use   Vaping Use: Never used  Substance Use Topics   Alcohol use: Yes   Drug use: No    No current facility-administered medications for this encounter.  Current Outpatient Medications:    benzonatate (TESSALON) 200 MG capsule, Take 1 capsule (200 mg total) by mouth 3 (three) times daily as needed for cough., Disp: 30 capsule, Rfl: 0   fluticasone (FLONASE) 50 MCG/ACT nasal spray, Place 2 sprays into both nostrils daily., Disp: 16 g, Rfl: 0   ibuprofen (ADVIL) 600 MG tablet, Take 1 tablet (600 mg total) by mouth every 6 (six) hours as needed., Disp: 30 tablet, Rfl: 0   aspirin EC 81 MG tablet,  Take 1 tablet (81 mg total) by mouth daily., Disp: , Rfl:    atorvastatin (LIPITOR) 80 MG tablet, TAKE ONE TABLET BY MOUTH ONCE DAILY AT  6PM, Disp: 90 tablet, Rfl: 3   carvedilol (COREG) 6.25 MG tablet, Take 1 tablet (6.25 mg total) by mouth 2 (two) times daily with a meal., Disp: 180 tablet, Rfl: 3   chlorthalidone (HYGROTON) 25 MG tablet, Take 1 tablet (25 mg total) by mouth daily., Disp: 90 tablet, Rfl: 3   Evolocumab (REPATHA SURECLICK) 140 MG/ML SOAJ, Inject 140 mg into the skin every 14 (fourteen) days., Disp: 2 mL, Rfl: 11   losartan (COZAAR) 100 MG tablet, Take 1 tablet (100 mg total) by mouth daily., Disp: 90 tablet, Rfl: 3   methimazole (TAPAZOLE) 5 MG tablet, Take 1  tablet by mouth once daily, Disp: 90 tablet, Rfl: 0   nitroGLYCERIN (NITROSTAT) 0.4 MG SL tablet, DISSOLVE ONE TABLET UNDER THE TONGUE EVERY 5 MINUTES AS NEEDED FOR CHEST PAIN.  DO NOT EXCEED A TOTAL OF 3 DOSES IN 15 MINUTES, Disp: 25 tablet, Rfl: 3  Allergies  Allergen Reactions   Ace Inhibitors Swelling    Lip swelling that resolved with benadryl, did not compromise his breathing     ROS  As noted in HPI.   Physical Exam  BP 132/76 (BP Location: Right Arm)   Pulse 100   Temp 99.4 F (37.4 C) (Oral)   Resp 16   SpO2 100%   Constitutional: Well developed, well nourished, no acute distress Eyes:  EOMI, conjunctiva normal bilaterally HENT: Normocephalic, atraumatic,mucus membranes moist.  Extensive nasal congestion.  No obvious postnasal drip. Respiratory: Normal inspiratory effort, lungs clear bilaterally, good air movement Cardiovascular: Borderline regular tachycardia, no appreciable murmur. GI: nondistended skin: No rash, skin intact Musculoskeletal: no deformities Neurologic: Alert & oriented x 3, no focal neuro deficits Psychiatric: Speech and behavior appropriate   ED Course   Medications - No data to display  Orders Placed This Encounter  Procedures   SARS CORONAVIRUS 2 (TAT 6-24 HRS) Nasopharyngeal Nasopharyngeal Swab    Standing Status:   Standing    Number of Occurrences:   1    Order Specific Question:   Is this test for diagnosis or screening    Answer:   Diagnosis of ill patient    Order Specific Question:   Symptomatic for COVID-19 as defined by CDC    Answer:   Yes    Order Specific Question:   Date of Symptom Onset    Answer:   09/10/2020    Order Specific Question:   Hospitalized for COVID-19    Answer:   No    Order Specific Question:   Admitted to ICU for COVID-19    Answer:   No    Order Specific Question:   Previously tested for COVID-19    Answer:   No    Order Specific Question:   Resident in a congregate (group) care setting    Answer:    No    Order Specific Question:   Employed in healthcare setting    Answer:   No    Order Specific Question:   Has patient completed COVID vaccination(s) (2 doses of Pfizer/Moderna 1 dose of [employer])    Answer:   Unknown   Results for orders placed or performed during the hospital encounter of 09/13/20  SARS CORONAVIRUS 2 (TAT 6-24 HRS) Nasopharyngeal Nasopharyngeal Swab   Specimen: Nasopharyngeal Swab  Result Value Ref Range  SARS Coronavirus 2 NEGATIVE NEGATIVE     No results found for this or any previous visit (from the past 24 hour(s)). No results found.  ED Clinical Impression  1. Upper respiratory tract infection, unspecified type   2. Encounter for laboratory testing for COVID-19 virus      ED Assessment/Plan  GFR from labs on 04/26/20 >60.  Patient will be a candidate for antivirals based on a history of coronary artery disease, hypercholesterolemia, hypertension and mitral valve prolapse.  If COVID is positive, will prescribe Paxlovid or Molnupiravir.  In the meantime, Tylenol/ibuprofen.  Patient states that he can take ibuprofen.  Tessalon, saline nasal irrigation, Mucinex.  Flonase if his COVID is negative.  We considered doing a flu test today but given the fact that he has no smell and taste, think that this is less likely, so flu testing was not done  COVID negative.  Patient with a URI.  Plan as above  Discussed labs, MDM, treatment plan, and plan for follow-up with patient. Discussed sn/sx that should prompt return to the ED. patient agrees with plan.   Meds ordered this encounter  Medications   fluticasone (FLONASE) 50 MCG/ACT nasal spray    Sig: Place 2 sprays into both nostrils daily.    Dispense:  16 g    Refill:  0   benzonatate (TESSALON) 200 MG capsule    Sig: Take 1 capsule (200 mg total) by mouth 3 (three) times daily as needed for cough.    Dispense:  30 capsule    Refill:  0   ibuprofen (ADVIL) 600 MG tablet    Sig: Take 1 tablet (600 mg  total) by mouth every 6 (six) hours as needed.    Dispense:  30 tablet    Refill:  0      *This clinic note was created using Scientist, clinical (histocompatibility and immunogenetics). Therefore, there may be occasional mistakes despite careful proofreading.  ?    Domenick Gong, MD 09/14/20 1459

## 2020-09-13 NOTE — Discharge Instructions (Addendum)
Your COVID will be back in 6 to 24 hours.  Take 600 mg of ibuprofen with 1000 mg of Tylenol together 3 or 4 times a day as needed for body aches.  Tessalon for the cough.  Saline nasal irrigation with a Lloyd Huger Med rinse and distilled water as often as you want Mucinex for the nasal congestion.  Flonase will help with the nasal congestion.  You may start Flonase if your COVID is negative.  If your COVID is positive, you cannot use Flonase.  If your COVID is positive and I prescribed you Paxlovid we may need to adjust some of your other medications.

## 2020-11-14 ENCOUNTER — Other Ambulatory Visit: Payer: Self-pay | Admitting: Cardiology

## 2020-11-14 ENCOUNTER — Other Ambulatory Visit: Payer: Self-pay | Admitting: Physician Assistant

## 2020-11-14 DIAGNOSIS — I10 Essential (primary) hypertension: Secondary | ICD-10-CM

## 2021-01-02 ENCOUNTER — Other Ambulatory Visit: Payer: Self-pay | Admitting: Cardiology

## 2021-01-02 DIAGNOSIS — I10 Essential (primary) hypertension: Secondary | ICD-10-CM

## 2021-02-03 ENCOUNTER — Other Ambulatory Visit: Payer: Self-pay | Admitting: Cardiology

## 2021-02-03 DIAGNOSIS — I10 Essential (primary) hypertension: Secondary | ICD-10-CM

## 2021-02-04 ENCOUNTER — Other Ambulatory Visit: Payer: Self-pay

## 2021-02-04 DIAGNOSIS — E785 Hyperlipidemia, unspecified: Secondary | ICD-10-CM

## 2021-03-31 ENCOUNTER — Telehealth: Payer: Self-pay | Admitting: Cardiology

## 2021-03-31 DIAGNOSIS — I10 Essential (primary) hypertension: Secondary | ICD-10-CM

## 2021-03-31 MED ORDER — CARVEDILOL 6.25 MG PO TABS: 6.2500 mg | ORAL_TABLET | Freq: Two times a day (BID) | ORAL | 1 refills | Status: DC

## 2021-03-31 MED ORDER — EZETIMIBE 10 MG PO TABS: 10.0000 mg | ORAL_TABLET | Freq: Every day | ORAL | 1 refills | Status: DC

## 2021-03-31 MED ORDER — ATORVASTATIN CALCIUM 80 MG PO TABS: ORAL_TABLET | ORAL | 1 refills | Status: DC

## 2021-03-31 MED ORDER — CHLORTHALIDONE 25 MG PO TABS: ORAL_TABLET | ORAL | 1 refills | Status: DC

## 2021-03-31 MED ORDER — LOSARTAN POTASSIUM 100 MG PO TABS: ORAL_TABLET | ORAL | 1 refills | Status: DC

## 2021-03-31 NOTE — Telephone Encounter (Signed)
Pt's medication was sent to pt's pharmacy as requested. Confirmation received.  °

## 2021-03-31 NOTE — Telephone Encounter (Signed)
°*  STAT* If patient is at the pharmacy, call can be transferred to refill team.   1. Which medications need to be refilled? (please list name of each medication and dose if known)  atorvastatin (LIPITOR) 80 MG tablet carvedilol (COREG) 6.25 MG tablet chlorthalidone (HYGROTON) 25 MG tablet ezetimibe (ZETIA) 10 MG tablet losartan (COZAAR) 100 MG tablet  2. Which pharmacy/location (including street and city if local pharmacy) is medication to be sent to? [facility] ([place] [place] - [address]  3. Do they need a 30 day or 90 day supply? 30 with refills   Patient is out of medication   Patient is scheduled to see Florene Glen 05/13/21

## 2021-05-13 ENCOUNTER — Ambulatory Visit: Payer: No Typology Code available for payment source | Admitting: Nurse Practitioner

## 2021-06-23 NOTE — Progress Notes (Signed)
Cardiology Office Note:    Date:  06/24/2021   ID:  Joseph Carr, DOB 06-18-1970, MRN [id number]  PCP:  Joseph Cha, MD   [facility] Providers Cardiologist:  Joseph Him, MD     Referring MD: Joseph Carr,*   Chief Complaint: overdue follow-up CAD  History of Present Illness:    Joseph Carr is a pleasant 51 y.o. male with a hx of hyperlipidemia, CAD, hypertension, and tobacco abuse.   NSTEMI 10/2013 treated with DES to RI, repeat cath for chest pain 06/2014 s/p DES to ramus-2 just distal to prior stent which also revealed 40% prox Cx lesion, prox LAD lesion 15%, patent RI stent, normal LV function. Brilinta previously switched to  Plavix after completing Twilight study and he was continued on aspirin. 2D Echo showed normal LVEF 55-60%, mild MVP (myxomatous changes). Nuclear stress test 12/2018 showed diaphragmatic attenuation but  no ischemia, EF 56% low risk study.   He was last seen in our office 09/01/19 by Joseph Carr. BP was elevated and he had run out of medications. Medication adjustments were made after he sent in BP readings from home. Referred to lipid clinic 10/2019 and advised to resume PCSK9i (previously stopped for unknown reason) and return for lab work after 4th dose. There is no record of follow-up.  Today, he is here alone for follow-up.  Reports he has run out of all of his medication. Is having occasional chest pains associated with diaphoresis and hand numbness.  On one occasion the pain lasted for a few hours and resolved without nitroglycerin. Has run out of ntg. No palpitations, dyspnea, presyncope, syncope, orthopnea, PND, or edema. No bleeding problems on aspirin. Gets lightheaded when he bends forward in the mornings. Drinks water all day. 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 Occasionally plays basketball and outdoor games with his young children. Continues to smoke 1/2 pack cigarettes every 2 weeks.  Drinks 4 beers daily.   Past  Medical History:  Diagnosis Date   At risk for noncompliance    CAD (coronary artery disease)    a. NSTEMI 10/2013 - DES to ramus intermedius.  b. 06/2014 Cath/PCI: LM nl, LAD 15p, RI patent stent prox, 26m (2.5x12 Promus Premier DES), LCX 40p, EF 55-65%.   Habitual alcohol use    Hyperlipidemia LDL goal <70    Hypertension    Hypertensive heart disease    mild LVH on MRI with no HOCM   Low TSH level    MVP (mitral valve prolapse)    Tobacco abuse     Past Surgical History:  Procedure Laterality Date   CARDIAC CATHETERIZATION N/A 07/03/2014   Procedure: Left Heart Cath and Coronary Angiography;  Surgeon: Joseph M Martinique, MD;  Location: [place] [facility];  Service: Cardiovascular;  Laterality: N/A;   CARDIAC CATHETERIZATION N/A 07/03/2014   Procedure: Coronary Stent Intervention;  Surgeon: Joseph M Martinique, MD;  Location: [place] [facility];  Service: Cardiovascular;  Laterality: N/A;   CORONARY ANGIOPLASTY     FINGER FRACTURE SURGERY Left 2014   4th finger   LEFT HEART CATHETERIZATION WITH CORONARY ANGIOGRAM N/A 10/20/2013   Procedure: LEFT HEART CATHETERIZATION WITH CORONARY ANGIOGRAM;  Surgeon: Joseph Booze, MD;  Location: [facility] CATH LAB;  Service: Cardiovascular;  Laterality: N/A;   PERCUTANEOUS CORONARY STENT INTERVENTION (PCI-S)  07/03/2014   RAMUS      Current Medications: Current Meds  Medication Sig   aspirin EC 81 MG tablet Take 1 tablet (81 mg  total) by mouth daily.   [DISCONTINUED] atorvastatin (LIPITOR) 80 MG tablet TAKE 1 TABLET BY MOUTH ONCE DAILY AT 6PM.   [DISCONTINUED] benzonatate (TESSALON) 200 MG capsule Take 1 capsule (200 mg total) by mouth 3 (three) times daily as needed for cough.   [DISCONTINUED] carvedilol (COREG) 6.25 MG tablet Take 1 tablet (6.25 mg total) by mouth 2 (two) times daily with a meal.   [DISCONTINUED] chlorthalidone (HYGROTON) 25 MG tablet TAKE 1 TABLET BY MOUTH ONCE DAILY   [DISCONTINUED] Evolocumab (REPATHA SURECLICK) 932 MG/ML SOAJ Inject  140 mg into the skin every 14 (fourteen) days.   [DISCONTINUED] fluticasone (FLONASE) 50 MCG/ACT nasal spray Place 2 sprays into both nostrils daily.   [DISCONTINUED] ibuprofen (ADVIL) 600 MG tablet Take 1 tablet (600 mg total) by mouth every 6 (six) hours as needed.   [DISCONTINUED] losartan (COZAAR) 100 MG tablet TAKE 1 TABLET BY MOUTH ONCE DAILY .   [DISCONTINUED] methimazole (TAPAZOLE) 5 MG tablet Take 1 tablet by mouth once daily   [DISCONTINUED] nitroGLYCERIN (NITROSTAT) 0.4 MG SL tablet DISSOLVE ONE TABLET UNDER THE TONGUE EVERY 5 MINUTES AS NEEDED FOR CHEST PAIN.  DO NOT EXCEED A TOTAL OF 3 DOSES IN 15 MINUTES     Allergies:   Ace inhibitors   Social History   Socioeconomic History   Marital status: Married    Spouse name: Not on file   Number of children: Not on file   Years of education: Not on file   Highest education level: Not on file  Occupational History   Occupation: Works in Associate Professor  Tobacco Use   Smoking status: Light Smoker    Packs/day: 0.75    Years: 30.00    Pack years: 22.50    Types: Cigarettes    Last attempt to quit: 07/03/2014    Years since quitting: 6.9   Smokeless tobacco: Never  Vaping Use   Vaping Use: Never used  Substance and Sexual Activity   Alcohol use: Yes   Drug use: No   Sexual activity: Not on file  Other Topics Concern   Not on file  Social History Narrative   Lives with family   Social Determinants of Health   Financial Resource Strain: Not on file  Food Insecurity: Not on file  Transportation Needs: Not on file  Physical Activity: Not on file  Stress: Not on file  Social Connections: Not on file     Family History: The patient's family history includes Heart attack in his father; Hypertension in his father; Stroke in his father. There is no history of Thyroid disease.  ROS:   Please see the history of present illness.    + occasional chest pain All other systems reviewed and are negative.  Labs/Other Studies Reviewed:     The following studies were reviewed today:  MR Cardiac 11/07/19  1. Findings not consistent with HOCM with concentric LVH 14 mm and no SAM or LVOT gradient Suggests HTN heart disease   2.  Normal LV size and function EF 59% with abnormal septal motion   3.  Normal RV size and function   4.  No delayed gadolinium uptake on inversion recovery sequences   5.  Flat closure MV with trivial MR   6.  AV sclerosis with mild AR   7.  Normal T1 and ECV  Echo 09/19/19   1. Left ventricular ejection fraction, by estimation, is 55 to 60%. The  left ventricle has normal function. The left ventricle has no regional  wall motion abnormalities. There is moderate left ventricular hypertrophy.  Left ventricular diastolic  parameters are consistent with Grade I diastolic dysfunction (impaired  relaxation).   2. Right ventricular systolic function is normal. The right ventricular  size is normal. Tricuspid regurgitation signal is inadequate for assessing  PA pressure.   3. The mitral valve is abnormal with flattened closure plane of the  mitral valve (without frank prolapse). Trivial mitral regurgitation. No  mitral stenosis.   4. The aortic valve is tricuspid. Aortic valve regurgitation is not  visualized. Mild aortic valve sclerosis is present, with no evidence of  aortic valve stenosis.   5. The inferior vena cava is normal in size with greater than 50%  respiratory variability, suggesting right atrial pressure of 3 mmHg.   Lexiscan myoview 12/13/18  Nuclear stress EF: 56%. There was no ST segment deviation noted during stress. The study is normal. This is a low risk study. The left ventricular ejection fraction is normal (55-65%).   Diaphragmatic attenuation no ischemia EF normal 56%  LHC 07/03/14  Prox Cx lesion, 40% stenosed. Prox LAD lesion, 15% stenosed. DES in ramus intermediate is patent Ramus-2 lesion, 90% stenosed just distal to prior stent. There is a 0% residual  stenosis post intervention. A drug-eluting stent was placed. Normal LV function.   Recommendation: Continue DAPT. Anticipate DC later today.    Diagnostic Dominance: Right Intervention     Recent Labs: No results found for requested labs within last 8760 hours.  Recent Lipid Panel    Component Value Date/Time   CHOL 212 (H) 09/19/2019 1235   TRIG 121 09/19/2019 1235   HDL 82 09/19/2019 1235   CHOLHDL 2.6 09/19/2019 1235   CHOLHDL 4.0 02/12/2015 1257   VLDL 18 02/12/2015 1257   LDLCALC 109 (H) 09/19/2019 1235     Risk Assessment/Calculations:       Physical Exam:    VS:  BP (!) 150/98 (BP Location: Left Arm, Patient Position: Sitting, Cuff Size: Normal)   Pulse 85   Ht $R'5\' 9"'iA$  (1.753 m)   Wt 150 lb 3.2 oz (68.1 kg)   SpO2 98%   BMI 22.18 kg/m     Wt Readings from Last 3 Encounters:  06/24/21 150 lb 3.2 oz (68.1 kg)  10/24/19 138 lb (62.6 kg)  10/22/19 139 lb 6.4 oz (63.2 kg)     GEN: Thin, Well nourished, well developed in no acute distress HEENT: Normal NECK: No JVD; No carotid bruits CARDIAC: RRR, no murmurs, rubs, gallops RESPIRATORY:  Clear to auscultation without rales, wheezing or rhonchi  ABDOMEN: Soft, non-tender, non-distended MUSCULOSKELETAL:  No edema; No deformity. 2+ pedal pulses, equal bilaterally SKIN: Warm and dry NEUROLOGIC:  Alert and oriented x 3 PSYCHIATRIC:  Normal affect   EKG:  EKG is ordered ordered today.  The ekg ordered today demonstrates NSR at 85 bpm, possible LAE, poss left ventricular hypertrophy, no acute change from previous  Diagnoses:    1. Coronary artery disease involving native coronary artery of native heart without angina pectoris   2. Essential hypertension   3. Hyperlipidemia LDL goal <70   4. Hypertensive left ventricular hypertrophy, without heart failure   5. Alcohol dependence, daily use ([place])   6. Tobacco abuse    Assessment and Plan:    CAD with stable angina: s/p DES to RI with 40% prox Cx lesion  and 15% LAD lesion 06/2014. Nuclear stress test low risk 12/2018.  Says stress test in 2020 caused Carr to have  severe shortness of breath. Having occasional episodes of chest pain accompanied by diaphoresis and hand numbness, concerning for angina.  He denies dyspnea. Has not taken nitroglycerin, has run out. We will refill nitroglycerin and I have encouraged Carr to keep this with Carr.  We will get a PET cardiac perfusion study for evaluation of worsening ischemia. ER precautions given. Resume carvedilol, statin. Continue aspirin.  Hypertension: BP is very elevated. Upon my recheck has improved slightly at 150/98. Out of all of his cardiac medications.  We will refill chlorthalidone, losartan, and carvedilol.  Emphasized the importance of regular follow-up so that he does not run out in the future. Will get complete metabolic panel today.   Moderate LVH: BP is quite elevated today.  He does not monitor at home and does not follow-up with PCP on a consistent basis. Emphasized the importance of good blood pressure control and continuing on medications as directed.  Hyperlipidemia LDL goal < 70: Was previously on Repatha but did not return for lab work.  We will restart ezetimibe and atorvastatin today.  We will get lipid panel.  Can discuss restarting Repatha once we get results  Tobacco abuse: He continues to smoke one half pack every 2 weeks. Complete cessation advised.   Alcohol consumption: Drinking 4 beers per day. Recommended he reduce to CDC recommendations of no more than 2 standard drinks per day.  Disposition: 6 months with Joseph Carr   Shared Decision Making/Informed Consent The risks [chest pain, shortness of breath, cardiac arrhythmias, dizziness, blood pressure fluctuations, myocardial infarction, stroke/transient ischemic attack, nausea, vomiting, allergic reaction, radiation exposure, metallic taste sensation and life-threatening complications (estimated to be 1 in 10,000)], benefits  (risk stratification, diagnosing coronary artery disease, treatment guidance) and alternatives of a cardiac PET stress test were discussed in detail with Joseph Carr and he agrees to proceed.    Medication Adjustments/Labs and Tests Ordered: Current medicines are reviewed at length with the patient today.  Concerns regarding medicines are outlined above.  Orders Placed This Encounter  Procedures   NM PET CT CARDIAC PERFUSION MULTI W/ABSOLUTE BLOODFLOW   Lipid Profile   Comp Met (CMET)   Cardiac Stress Test: Informed Consent Details: Physician/Practitioner Attestation; Transcribe to consent form and obtain patient signature   EKG 12-Lead   Meds ordered this encounter  Medications   nitroGLYCERIN (NITROSTAT) 0.4 MG SL tablet    Sig: DISSOLVE ONE TABLET UNDER THE TONGUE EVERY 5 MINUTES AS NEEDED FOR CHEST PAIN.  DO NOT EXCEED A TOTAL OF 3 DOSES IN 15 MINUTES    Dispense:  25 tablet    Refill:  3    Please consider 90 day supplies to promote better adherence   atorvastatin (LIPITOR) 80 MG tablet    Sig: TAKE 1 TABLET BY MOUTH ONCE DAILY AT 6PM.    Dispense:  30 tablet    Refill:  11   carvedilol (COREG) 6.25 MG tablet    Sig: Take 1 tablet (6.25 mg total) by mouth 2 (two) times daily with a meal.    Dispense:  60 tablet    Refill:  11   losartan (COZAAR) 100 MG tablet    Sig: TAKE 1 TABLET BY MOUTH ONCE DAILY .    Dispense:  30 tablet    Refill:  11   chlorthalidone (HYGROTON) 25 MG tablet    Sig: TAKE 1 TABLET BY MOUTH ONCE DAILY    Dispense:  30 tablet    Refill:  11   ezetimibe (ZETIA)  10 MG tablet    Sig: Take 1 tablet (10 mg total) by mouth daily.    Dispense:  30 tablet    Refill:  11    Patient Instructions  Medication Instructions:   Your physician recommends that you continue on your current medications as directed. Please refer to the Current Medication list given to you today.   *If you need a refill on your cardiac medications before your next appointment,  please call your pharmacy*   Lab Work:  TODAY!!!!! LIPID/CMET  If you have labs (blood work) drawn today and your tests are completely normal, you will receive your results only by: [place] (if you have MyChart) OR A paper copy in the mail If you have any lab test that is abnormal or we need to change your treatment, we will call you to review the results.   Testing/Procedures:  How to Prepare for Your Cardiac PET/CT Stress Test:  1. Please do not take these medications before your test: Coreg   2. Nothing to eat or drink, except water, 3 hours prior to arrival time.   NO caffeine/decaffeinated products, or chocolate 12 hours prior to arrival.  3. NO cologne or lotion  4. Total time is 1 to 2 hours; you may want to bring reading material for the waiting time.  5. Please report to Admitting at the [facility] Entrance 60 minutes early for your test.  [place],  [id number]  In preparation for your appointment, medication and supplies will be purchased.  Appointment availability is limited, so if you need to cancel or reschedule, please call the Radiology Department at [phone]  24 hours in advance to avoid a cancellation fee of $100.00  What to Expect After you Arrive:  Once you arrive and check in for your appointment, you will be taken to a preparation room within the Radiology Department.  A technologist or Nurse will obtain your medical history, verify that you are correctly prepped for the exam, and explain the procedure.  Afterwards,  an IV will be started in your arm and electrodes will be placed on your skin for EKG monitoring during the stress portion of the exam. Then you will be escorted to the PET/CT scanner.  There, staff will get you positioned on the scanner and obtain a blood pressure and EKG.  During the exam, you will continue to be connected to the EKG and blood pressure machines.  A small, safe amount of a  radioactive tracer will be injected in your IV to obtain a series of pictures of your heart along with an injection of a stress agent.    After your Exam:  It is recommended that you eat a meal and drink a caffeinated beverage to counter act any effects of the stress agent.  Drink plenty of fluids for the remainder of the day and urinate frequently for the first couple of hours after the exam.  Your doctor will inform you of your test results within 7-10 business days.  For questions about your test or how to prepare for your test, please call: Marchia Bond, Cardiac Imaging Nurse Navigator  Gordy Clement, Cardiac Imaging Nurse Navigator Office: [phone]    Follow-Up: At [facility], you and your health needs are our priority.  As part of our continuing mission to provide you with exceptional heart care, we have created designated Provider Care Teams.  These Care Teams include your primary Cardiologist (physician) and Advanced Practice  Providers (APPs -  Physician Assistants and Nurse Practitioners) who all work together to provide you with the care you need, when you need it.  We recommend signing up for the patient portal called "MyChart".  Sign up information is provided on this After Visit Summary.  MyChart is used to connect with patients for Virtual Visits (Telemedicine).  Patients are able to view lab/test results, encounter notes, upcoming appointments, etc.  Non-urgent messages can be sent to your provider as well.   To learn more about what you can do with MyChart, go to [email or web address].    Your next appointment:   6 month(s)  The format for your next appointment:   In Person  Provider:   Fransico Him, MD     Important Information About Sugar         Signed, Emmaline Life, NP  06/24/2021 4:30 PM    [place]

## 2021-06-24 ENCOUNTER — Ambulatory Visit: Payer: BC Managed Care – PPO | Admitting: Nurse Practitioner

## 2021-06-24 ENCOUNTER — Encounter: Payer: Self-pay | Admitting: Nurse Practitioner

## 2021-06-24 VITALS — BP 150/98 | HR 85 | Ht 69.0 in | Wt 150.2 lb

## 2021-06-24 DIAGNOSIS — I119 Hypertensive heart disease without heart failure: Secondary | ICD-10-CM

## 2021-06-24 DIAGNOSIS — I2510 Atherosclerotic heart disease of native coronary artery without angina pectoris: Secondary | ICD-10-CM

## 2021-06-24 DIAGNOSIS — E785 Hyperlipidemia, unspecified: Secondary | ICD-10-CM

## 2021-06-24 DIAGNOSIS — F1020 Alcohol dependence, uncomplicated: Secondary | ICD-10-CM

## 2021-06-24 DIAGNOSIS — I10 Essential (primary) hypertension: Secondary | ICD-10-CM | POA: Diagnosis not present

## 2021-06-24 DIAGNOSIS — Z720 Tobacco use: Secondary | ICD-10-CM

## 2021-06-24 MED ORDER — CARVEDILOL 6.25 MG PO TABS
6.2500 mg | ORAL_TABLET | Freq: Two times a day (BID) | ORAL | 11 refills | Status: DC
Start: 2021-06-24 — End: 2022-07-12

## 2021-06-24 MED ORDER — NITROGLYCERIN 0.4 MG SL SUBL
SUBLINGUAL_TABLET | SUBLINGUAL | 3 refills | Status: AC
Start: 2021-06-24 — End: ?

## 2021-06-24 MED ORDER — LOSARTAN POTASSIUM 100 MG PO TABS: ORAL_TABLET | ORAL | 11 refills | Status: DC

## 2021-06-24 MED ORDER — CHLORTHALIDONE 25 MG PO TABS
ORAL_TABLET | ORAL | 11 refills | Status: DC
Start: 2021-06-24 — End: 2022-07-12

## 2021-06-24 MED ORDER — ATORVASTATIN CALCIUM 80 MG PO TABS: ORAL_TABLET | ORAL | 11 refills | Status: DC

## 2021-06-24 MED ORDER — EZETIMIBE 10 MG PO TABS: 10.0000 mg | ORAL_TABLET | Freq: Every day | ORAL | 11 refills | Status: DC

## 2021-06-24 NOTE — Patient Instructions (Signed)
Medication Instructions:   Your physician recommends that you continue on your current medications as directed. Please refer to the Current Medication list given to you today.   *If you need a refill on your cardiac medications before your next appointment, please call your pharmacy*   Lab Work:  TODAY!!!!! LIPID/CMET  If you have labs (blood work) drawn today and your tests are completely normal, you will receive your results only by: MyChart Message (if you have MyChart) OR A paper copy in the mail If you have any lab test that is abnormal or we need to change your treatment, we will call you to review the results.   Testing/Procedures:  How to Prepare for Your Cardiac PET/CT Stress Test:  1. Please do not take these medications before your test: Coreg   2. Nothing to eat or drink, except water, 3 hours prior to arrival time.   NO caffeine/decaffeinated products, or chocolate 12 hours prior to arrival.  3. NO cologne or lotion  4. Total time is 1 to 2 hours; you may want to bring reading material for the waiting time.  5. Please report to Admitting at the [facility] Main Entrance 60 minutes early for your test.  [address] [place], [place] [address]  In preparation for your appointment, medication and supplies will be purchased.  Appointment availability is limited, so if you need to cancel or reschedule, please call the Radiology Department at [phone]  24 hours in advance to avoid a cancellation fee of $100.00  What to Expect After you Arrive:  Once you arrive and check in for your appointment, you will be taken to a preparation room within the Radiology Department.  A technologist or Nurse will obtain your medical history, verify that you are correctly prepped for the exam, and explain the procedure.  Afterwards,  an IV will be started in your arm and electrodes will be placed on your skin for EKG monitoring during the stress portion of the exam. Then  you will be escorted to the PET/CT scanner.  There, staff will get you positioned on the scanner and obtain a blood pressure and EKG.  During the exam, you will continue to be connected to the EKG and blood pressure machines.  A small, safe amount of a radioactive tracer will be injected in your IV to obtain a series of pictures of your heart along with an injection of a stress agent.    After your Exam:  It is recommended that you eat a meal and drink a caffeinated beverage to counter act any effects of the stress agent.  Drink plenty of fluids for the remainder of the day and urinate frequently for the first couple of hours after the exam.  Your doctor will inform you of your test results within 7-10 business days.  For questions about your test or how to prepare for your test, please call: Rockwell Alexandria, Cardiac Imaging Nurse Navigator  Larey Brick, Cardiac Imaging Nurse Navigator Office: [phone]    Follow-Up: At [facility], you and your health needs are our priority.  As part of our continuing mission to provide you with exceptional heart care, we have created designated Provider Care Teams.  These Care Teams include your primary Cardiologist (physician) and Advanced Practice Providers (APPs -  Physician Assistants and Nurse Practitioners) who all work together to provide you with the care you need, when you need it.  We recommend signing up for the patient portal called "MyChart".  Sign up information is provided on this After Visit Summary.  MyChart is used to connect with patients for Virtual Visits (Telemedicine).  Patients are able to view lab/test results, encounter notes, upcoming appointments, etc.  Non-urgent messages can be sent to your provider as well.   To learn more about what you can do with MyChart, go to [email or web address].    Your next appointment:   6 month(s)  The format for your next appointment:   In Person  Provider:   Armanda Magic, MD      Important Information About Sugar

## 2021-06-25 LAB — COMPREHENSIVE METABOLIC PANEL
ALT: 25 IU/L (ref 0–44)
AST: 39 IU/L (ref 0–40)
Albumin/Globulin Ratio: 1.5 (ref 1.2–2.2)
Albumin: 4.4 g/dL (ref 3.8–4.9)
Alkaline Phosphatase: 65 IU/L (ref 44–121)
BUN/Creatinine Ratio: 11 (ref 9–20)
BUN: 12 mg/dL (ref 6–24)
Bilirubin Total: 0.2 mg/dL (ref 0.0–1.2)
CO2: 21 mmol/L (ref 20–29)
Calcium: 9.4 mg/dL (ref 8.7–10.2)
Chloride: 102 mmol/L (ref 96–106)
Creatinine, Ser: 1.09 mg/dL (ref 0.76–1.27)
Globulin, Total: 3 g/dL (ref 1.5–4.5)
Glucose: 81 mg/dL (ref 70–99)
Potassium: 4.2 mmol/L (ref 3.5–5.2)
Sodium: 139 mmol/L (ref 134–144)
Total Protein: 7.4 g/dL (ref 6.0–8.5)
eGFR: 82 mL/min/{1.73_m2} (ref >59)

## 2021-06-25 LAB — LIPID PANEL
Chol/HDL Ratio: 2.7 ratio (ref 0.0–5.0)
Cholesterol, Total: 159 mg/dL (ref 100–199)
HDL: 59 mg/dL (ref >39)
LDL Chol Calc (NIH): 72 mg/dL (ref 0–99)
Triglycerides: 167 mg/dL — ABNORMAL HIGH (ref 0–149)
VLDL Cholesterol Cal: 28 mg/dL (ref 5–40)

## 2021-09-09 ENCOUNTER — Telehealth (HOSPITAL_COMMUNITY): Payer: Self-pay | Admitting: Emergency Medicine

## 2021-09-09 NOTE — Telephone Encounter (Signed)
Attempted to call patient regarding upcoming cardiac PET appointment. Left message on voicemail with name and callback number   RN Navigator Cardiac Imaging [place] [facility] [phone] Office [phone] Cell  

## 2021-09-13 ENCOUNTER — Telehealth: Payer: Self-pay

## 2021-09-13 ENCOUNTER — Encounter (HOSPITAL_COMMUNITY): Admission: RE | Admit: 2021-09-13 | Payer: BC Managed Care – PPO | Source: Ambulatory Visit

## 2021-09-13 NOTE — Telephone Encounter (Signed)
Left message for patient to call back  

## 2021-09-13 NOTE — Telephone Encounter (Signed)
-----   Message from Sherlean Foot sent at 09/13/2021 10:15 AM EDT ----- Regarding: RE: CARDIAC PET  let me know after you talk to  the patient and I'll squeeze him in sooner. Thanks! ----- Message ----- From: Quintella Reichert, MD Sent: 09/13/2021   9:28 AM EDT To: Lennie Odor, RN; Levi Aland, NP; # Subject: RE: CARDIAC PET                                 please call this patient and let him know that I really want him to have this study and please get it rescheduled ----- Message ----- From: Lennie Odor, RN Sent: 09/13/2021   9:01 AM EDT To: Quintella Reichert, MD; Levi Aland, NP; # Subject: CARDIAC PET                                    Hey letting you know this patient was a no show to todays PET appt. I attempted to call him Friday and yesterday as well as this morning. No answer to his phone or wifes phone.  Luna Kitchens, please reach out to r/s if you can contact him  Thank you, Rockwell Alexandria RN Navigator Cardiac Imaging [facility] [phone] Office  [phone] Cell

## 2021-09-20 NOTE — Telephone Encounter (Signed)
Spoke with the patient and his PET/CT has been rescheduled.

## 2021-10-31 ENCOUNTER — Telehealth (HOSPITAL_COMMUNITY): Payer: Self-pay | Admitting: *Deleted

## 2021-10-31 NOTE — Telephone Encounter (Signed)
Attempted to call patient regarding upcoming cardiac PET appointment. Left message on voicemail with name and callback number    RN Navigator Cardiac Imaging [place] [facility] [phone] Office [phone] Cell  

## 2021-11-01 ENCOUNTER — Ambulatory Visit (HOSPITAL_COMMUNITY): Admission: RE | Admit: 2021-11-01 | Payer: BC Managed Care – PPO | Source: Ambulatory Visit

## 2021-12-21 ENCOUNTER — Ambulatory Visit: Payer: BC Managed Care – PPO | Attending: Cardiology | Admitting: Cardiology

## 2021-12-21 ENCOUNTER — Encounter: Payer: Self-pay | Admitting: Cardiology

## 2021-12-21 VITALS — BP 142/76 | HR 78 | Ht 69.0 in | Wt 153.4 lb

## 2021-12-21 DIAGNOSIS — I10 Essential (primary) hypertension: Secondary | ICD-10-CM | POA: Diagnosis not present

## 2021-12-21 DIAGNOSIS — E785 Hyperlipidemia, unspecified: Secondary | ICD-10-CM

## 2021-12-21 DIAGNOSIS — I341 Nonrheumatic mitral (valve) prolapse: Secondary | ICD-10-CM | POA: Diagnosis not present

## 2021-12-21 DIAGNOSIS — I2510 Atherosclerotic heart disease of native coronary artery without angina pectoris: Secondary | ICD-10-CM | POA: Diagnosis not present

## 2021-12-21 NOTE — Progress Notes (Signed)
Date:  12/21/2021   ID:  Joseph Carr, DOB [birth year], MRN [id number]   PCP:  Lorenda Ishihara, MD  Cardiologist:  Armanda Magic, MD  Electrophysiologist:  None   Evaluation Performed:  Follow-Up Visit  Chief Complaint:  CAD, HTN, HLD  History of Present Illness:    Joseph Carr is a 51 y.o. male with CAD (s/p NSTEMI 10/2013 treated with DES to RI, repeat cath for chest pain 2016 s/p DES to ramus-2 just distal to prior stent), HTN, HLD, possible hyperthyroidism, tobacco abuse, habitual alcohol intake, and intermittent noncompliance.  His last cath was in 06/2014 at time of second PCI. This otherwise showed 40% prox Cx, 15% prox LAD, normal LVF. His Brilinta was previously switched to Plavix after completing Twilight study and was he continued on ASA. 2D echo 06/2015 showed normal EF 55-60%, mild mitral valve prolapse (myxomatous changes). Nuclear stress test 12/13/18 showed diaphragmatic attenuation but no ischemia, EF 56%, low risk study.   He is here today for followup and is doing well.  He did have a few "pins and needles" sensation in his chest a few months ago but only lasted a few seconds. He denies any anginal chest pain or pressure, SOB, DOE, PND, orthopnea, LE edema, palpitations or syncope. He is compliant with his meds and is tolerating meds with no SE.    Past Medical History:  Diagnosis Date   At risk for noncompliance    CAD (coronary artery disease)    a. NSTEMI 10/2013 - DES to ramus intermedius.  b. 06/2014 Cath/PCI: LM nl, LAD 15p, RI patent stent prox, 38m (2.5x12 Promus Premier DES), LCX 40p, EF 55-65%.   Habitual alcohol use    Hyperlipidemia LDL goal <70    Hypertension    Hypertensive heart disease    mild LVH on MRI with no HOCM   Low TSH level    MVP (mitral valve prolapse)    Tobacco abuse    Past Surgical History:  Procedure Laterality Date   CARDIAC CATHETERIZATION N/A 07/03/2014   Procedure: Left Heart Cath and Coronary Angiography;  Surgeon:  Peter M Swaziland, MD;  Location: [facility] INVASIVE CV LAB;  Service: Cardiovascular;  Laterality: N/A;   CARDIAC CATHETERIZATION N/A 07/03/2014   Procedure: Coronary Stent Intervention;  Surgeon: Peter M Swaziland, MD;  Location: [facility] INVASIVE CV LAB;  Service: Cardiovascular;  Laterality: N/A;   CORONARY ANGIOPLASTY     FINGER FRACTURE SURGERY Left 2014   4th finger   LEFT HEART CATHETERIZATION WITH CORONARY ANGIOGRAM N/A 10/20/2013   Procedure: LEFT HEART CATHETERIZATION WITH CORONARY ANGIOGRAM;  Surgeon: Corky Crafts, MD;  Location: [facility] CATH LAB;  Service: Cardiovascular;  Laterality: N/A;   PERCUTANEOUS CORONARY STENT INTERVENTION (PCI-S)  07/03/2014   RAMUS       Current Meds  Medication Sig   aspirin EC 81 MG tablet Take 1 tablet (81 mg total) by mouth daily.   atorvastatin (LIPITOR) 80 MG tablet TAKE 1 TABLET BY MOUTH ONCE DAILY AT 6PM.   carvedilol (COREG) 6.25 MG tablet Take 1 tablet (6.25 mg total) by mouth 2 (two) times daily with a meal.   chlorthalidone (HYGROTON) 25 MG tablet TAKE 1 TABLET BY MOUTH ONCE DAILY   ezetimibe (ZETIA) 10 MG tablet Take 1 tablet (10 mg total) by mouth daily.   losartan (COZAAR) 100 MG tablet TAKE 1 TABLET BY MOUTH ONCE DAILY .   nitroGLYCERIN (NITROSTAT) 0.4 MG SL tablet DISSOLVE ONE TABLET UNDER THE TONGUE EVERY 5  MINUTES AS NEEDED FOR CHEST PAIN.  DO NOT EXCEED A TOTAL OF 3 DOSES IN 15 MINUTES     Allergies:   Ace inhibitors   Social History   Tobacco Use   Smoking status: Light Smoker    Packs/day: 0.75    Years: 30.00    Total pack years: 22.50    Types: Cigarettes    Last attempt to quit: 07/03/2014    Years since quitting: 7.4   Smokeless tobacco: Never  Vaping Use   Vaping Use: Never used  Substance Use Topics   Alcohol use: Yes   Drug use: No     Family Hx: The patient's family history includes Heart attack in his father; Hypertension in his father; Stroke in his father. There is no history of Thyroid disease.  ROS:   Please see the  history of present illness.    All other systems reviewed and are negative.   Prior CV studies:    Most recent pertinent cardiac studies are outlined above.  Labs/Other Tests and Data Reviewed:    EKG:  NSR with LVH by voltage  Recent Labs: 06/24/2021: ALT 25; BUN 12; Creatinine, Ser 1.09; Potassium 4.2; Sodium 139   Recent Lipid Panel Lab Results  Component Value Date/Time   CHOL 159 06/24/2021 03:46 PM   TRIG 167 (H) 06/24/2021 03:46 PM   HDL 59 06/24/2021 03:46 PM   CHOLHDL 2.7 06/24/2021 03:46 PM   CHOLHDL 4.0 02/12/2015 12:57 PM   LDLCALC 72 06/24/2021 03:46 PM    Wt Readings from Last 3 Encounters:  12/21/21 153 lb 6.4 oz (69.6 kg)  06/24/21 150 lb 3.2 oz (68.1 kg)  10/24/19 138 lb (62.6 kg)     Objective:    Vital Signs:  BP (!) 142/76   Pulse 78   Ht 5\' 9"  (1.753 m)   Wt 153 lb 6.4 oz (69.6 kg)   SpO2 97%   BMI 22.65 kg/m    VGEN: Well nourished, well developed in no acute distress HEENT: Normal NECK: No JVD; No carotid bruits LYMPHATICS: No lymphadenopathy CARDIAC:RRR, no murmurs, rubs, gallops RESPIRATORY:  Clear to auscultation without rales, wheezing or rhonchi  ABDOMEN: Soft, non-tender, non-distended MUSCULOSKELETAL:  No edema; No deformity  SKIN: Warm and dry NEUROLOGIC:  Alert and oriented x 3 PSYCHIATRIC:  Normal affect    ASSESSMENT & PLAN:    ASCAD with chronic atypical CP -s/p NSTEMI 10/2013 - DES to ramus intermedius. b.  -s/p Cath/PCI 2016: LM nl, LAD 15p, RI patent stent prox, 40m (2.5x12 Promus Premier DES), LCX 40p, EF 55-65%. -Nuclear stress test with no ischemia 12/2018   -he denies any anginal symptoms -Continue aspirin 81 mg daily and statin therapy  Essential HTN  -BP is borderline elevated on exam today -Continue prescription drug therapy with chlorthalidone 25 mg daily, losartan 100 mg daily and 6.25 mg twice daily with as needed refills -check 48 hour BP monitor  Hyperlipidemia -LDL goal < 70 -I have personally  reviewed and interpreted outside labs performed by patient's PCP which showed LDL 72 and HDL 59 on 06/24/2021.   -Continue prescription drug therapy with atorvastatin 80 mg daily and Zetia 10mg  daily with as needed refills -repeat FLP and ALT and if LDL still not at goal then refer to lipid clinic  4.  Mitral valve prolapse  -2D echo 2021 showed EF 55 to 60% with grade 1 diastolic dysfunction and trivial MR with no frank mitral valve prolapse   Medication Adjustments/Labs and  Tests Ordered: Current medicines are reviewed at length with the patient today.  Concerns regarding medicines are outlined above.   Disposition:  Follow up in 6 months with Dr. Mayford Knife.  Signed, Armanda Magic, MD  12/21/2021 3:50 PM    [facility]

## 2021-12-21 NOTE — Patient Instructions (Addendum)
Medication Instructions:  Your physician recommends that you continue on your current medications as directed. Please refer to the Current Medication list given to you today.  *If you need a refill on your cardiac medications before your next appointment, please call your pharmacy*  Lab Work: Fasting lipids and ALT on Friday 11/17 between 7:15am and 5:00pm  If you have labs (blood work) drawn today and your tests are completely normal, you will receive your results only by: MyChart Message (if you have MyChart) OR A paper copy in the mail If you have any lab test that is abnormal or we need to change your treatment, we will call you to review the results.  Testing/Procedures: You provider has requested that you wear a blood pressure monitor. Our techs will reach out to you to arrange this.   Follow-Up: At [facility], you and your health needs are our priority.  As part of our continuing mission to provide you with exceptional heart care, we have created designated Provider Care Teams.  These Care Teams include your primary Cardiologist (physician) and Advanced Practice Providers (APPs -  Physician Assistants and Nurse Practitioners) who all work together to provide you with the care you need, when you need it.  Your next appointment:   1 year(s)  The format for your next appointment:   In Person  Provider:   Armanda Magic, MD     Important Information About Sugar

## 2021-12-21 NOTE — Addendum Note (Signed)
Addended by: Frutoso Schatz on: 12/21/2021 04:01 PM   Modules accepted: Orders

## 2021-12-23 ENCOUNTER — Ambulatory Visit: Payer: BC Managed Care – PPO | Attending: Cardiology

## 2021-12-23 DIAGNOSIS — I10 Essential (primary) hypertension: Secondary | ICD-10-CM | POA: Diagnosis not present

## 2021-12-23 DIAGNOSIS — E785 Hyperlipidemia, unspecified: Secondary | ICD-10-CM | POA: Diagnosis not present

## 2021-12-23 DIAGNOSIS — I2510 Atherosclerotic heart disease of native coronary artery without angina pectoris: Secondary | ICD-10-CM

## 2021-12-23 DIAGNOSIS — I341 Nonrheumatic mitral (valve) prolapse: Secondary | ICD-10-CM

## 2021-12-24 LAB — LIPID PANEL
Chol/HDL Ratio: 3.5 ratio (ref 0.0–5.0)
Cholesterol, Total: 188 mg/dL (ref 100–199)
HDL: 54 mg/dL (ref >39)
LDL Chol Calc (NIH): 116 mg/dL — ABNORMAL HIGH (ref 0–99)
Triglycerides: 102 mg/dL (ref 0–149)
VLDL Cholesterol Cal: 18 mg/dL (ref 5–40)

## 2021-12-24 LAB — ALT: ALT: 25 IU/L (ref 0–44)

## 2021-12-26 ENCOUNTER — Telehealth: Payer: Self-pay

## 2021-12-26 DIAGNOSIS — E785 Hyperlipidemia, unspecified: Secondary | ICD-10-CM

## 2021-12-26 NOTE — Telephone Encounter (Signed)
-----   Message from Awilda Metro, RPH-CPP sent at 12/26/2021  8:43 AM EST ----- LDL previously 72 on same regimen of atorvastatin 80mg  daily and ezetimibe 10mg  daily. Would confirm if pt has been adherent to his meds, suspected nonadherence would increase his LDL to 116, pt needs to resume both meds. If he has been adherent with both, please have pt scheduled in lipid clinic to discuss addition of [id number].

## 2021-12-26 NOTE — Telephone Encounter (Signed)
The patient has been notified of the result and verbalized understanding.  All questions (if any) were answered. Frutoso Schatz, RN 12/26/2021 1:04 PM   Patient confirms adherence to atorvastatin and zetia. Referral placed for lipid clinic.

## 2022-02-14 ENCOUNTER — Telehealth: Payer: Self-pay | Admitting: Pharmacist

## 2022-02-14 ENCOUNTER — Ambulatory Visit: Payer: BC Managed Care – PPO | Attending: Internal Medicine | Admitting: Pharmacist

## 2022-02-14 VITALS — BP 144/88 | HR 60

## 2022-02-14 DIAGNOSIS — I10 Essential (primary) hypertension: Secondary | ICD-10-CM

## 2022-02-14 DIAGNOSIS — E785 Hyperlipidemia, unspecified: Secondary | ICD-10-CM | POA: Diagnosis not present

## 2022-02-14 MED ORDER — AMLODIPINE BESYLATE 5 MG PO TABS: 5.0000 mg | ORAL_TABLET | Freq: Every day | ORAL | 3 refills | Status: DC

## 2022-02-14 MED ORDER — EZETIMIBE 10 MG PO TABS: 10.0000 mg | ORAL_TABLET | Freq: Every day | ORAL | 3 refills | Status: DC

## 2022-02-14 MED ORDER — ATORVASTATIN CALCIUM 80 MG PO TABS: ORAL_TABLET | ORAL | 3 refills | Status: DC

## 2022-02-14 MED ORDER — REPATHA SURECLICK 140 MG/ML ~~LOC~~ SOAJ: 1.0000 mL | SUBCUTANEOUS | 11 refills | Status: DC

## 2022-02-14 NOTE — Assessment & Plan Note (Signed)
Assessment: LDL-C is above goal of less than 55 Patient reports compliance, unsure if this but either way his LDL-C has never been less than 55 therefore he is in need of additional therapy Encouraged to decrease fried foods limit sweetened beverages and increase exercise.  Also encouraged to increase his vegetable intake  Plan: Will submit prior authorization for Repatha 140 mg/mL.  Advised patient to look for co-pay card or requested new one Continue atorvastatin and ezetimibe.  This was stressed to patient that he needs to continue both of these medications as well He will need lipid panel scheduled in 2 to 3 months

## 2022-02-14 NOTE — Progress Notes (Signed)
Patient ID: [name]                 DOB: [birth year]                    MRN: [id number]      HPI: Joseph Carr is a 52 y.o. male patient referred to lipid clinic by Dr. Mayford Knife. PMH is significant for CAD (s/p NSTEMI 10/2013 treated with DES to RI, repeat cath for chest pain 2016 s/p DES to ramus-2 just distal to prior stent), HTN, HLD, possible hyperthyroidism, tobacco abuse, habitual alcohol intake, and intermittent noncompliance.   Patient had repeat liver labs in November that showed an increase in LDL-C.  He presents today to lipid clinic.  There seems to be some confusion as to if he is taking both ezetimibe and atorvastatin.  He had previously been on Repatha.  Seems like it was stopped because he did not get repeat labs.  Patient patient prefers to be on shot per his report.  Reports blood pressure at home in the 130s and 140s/80-90's.   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, up and down the fork lift  Family History:  Family History  Problem Relation Age of Onset   Heart attack Father    Hypertension Father    Stroke Father    Thyroid disease Neg Hx     Social History: smokes maybe 3 days per week, 2 beers per day (small "pony bottles)  Labs: Lipid Panel     Component Value Date/Time   CHOL 188 12/23/2021 1233   TRIG 102 12/23/2021 1233   HDL 54 12/23/2021 1233   CHOLHDL 3.5 12/23/2021 1233   CHOLHDL 4.0 02/12/2015 1257   VLDL 18 02/12/2015 1257   LDLCALC 116 (H) 12/23/2021 1233   LABVLDL 18  12/23/2021 1233    Past Medical History:  Diagnosis Date   At risk for noncompliance    CAD (coronary artery disease)    a. NSTEMI 10/2013 - DES to ramus intermedius.  b. 06/2014 Cath/PCI: LM nl, LAD 15p, RI patent stent prox, 2m (2.5x12 Promus Premier DES), LCX 40p, EF 55-65%.   Habitual alcohol use    Hyperlipidemia LDL goal <70    Hypertension    Hypertensive heart disease    mild LVH on MRI with no HOCM   Low TSH level    MVP (mitral valve prolapse)    Tobacco abuse     Current Outpatient Medications on File Prior to Visit  Medication Sig Dispense Refill   aspirin EC 81 MG tablet Take 1 tablet (81 mg total) by mouth daily.     carvedilol (COREG) 6.25 MG tablet Take 1 tablet (6.25 mg total) by mouth 2 (two) times daily with a meal. 60 tablet 11   chlorthalidone (HYGROTON) 25 MG tablet TAKE 1 TABLET BY MOUTH ONCE DAILY 30 tablet 11   losartan (COZAAR) 100 MG tablet TAKE 1 TABLET BY MOUTH ONCE DAILY . 30 tablet 11   nitroGLYCERIN (NITROSTAT) 0.4 MG SL tablet DISSOLVE ONE TABLET UNDER THE TONGUE EVERY 5 MINUTES AS NEEDED  FOR CHEST PAIN.  DO NOT EXCEED A TOTAL OF 3 DOSES IN 15 MINUTES 25 tablet 3   No current facility-administered medications on file prior to visit.    Allergies  Allergen Reactions   Ace Inhibitors Swelling    Lip swelling that resolved with benadryl, did not compromise his breathing    Assessment/Plan:  1. Hyperlipidemia -  Essential hypertension Assessment: Blood pressure elevated in clinic and at home Patient encouraged to increase exercise He reports compliance with his medications  Plan: Add amlodipine 5 mg daily Continue losartan 100 mg daily, chlorthalidone 25 mg daily Follow-up in clinic in 4 to 6 weeks for blood pressure check Patient asked to check blood pressure at home once a day and to bring his home blood pressure cuff and readings to next clinic appointment  Hyperlipidemia LDL goal <70 Assessment: LDL-C is above goal of less than  55 Patient reports compliance, unsure if this but either way his LDL-C has never been less than 55 therefore he is in need of additional therapy Encouraged to decrease fried foods limit sweetened beverages and increase exercise.  Also encouraged to increase his vegetable intake  Plan: Will submit prior authorization for Repatha 140 mg/mL.  Advised patient to look for co-pay card or requested new one Continue atorvastatin and ezetimibe.  This was stressed to patient that he needs to continue both of these medications as well He will need lipid panel scheduled in 2 to 3 months  ED  Patient asking if it is okay for him to take Viagra for ED.  He received a prescription from an online doctor.  Advised that is okay for him to take.  But he cannot take within 72 hours of his nitroglycerin or will cause significantly low blood pressure.  If he experiences chest pain he is to call 911   Thank you,  Ramond Dial, Pharm.D, BCPS, CPP [facility] [address], [place],  [address]  Phone: [phone]; Fax: [phone]

## 2022-02-14 NOTE — Telephone Encounter (Signed)
PA approved though 02/13/23 Rx sent to pharmacy

## 2022-02-14 NOTE — Assessment & Plan Note (Signed)
Assessment: Blood pressure elevated in clinic and at home Patient encouraged to increase exercise He reports compliance with his medications  Plan: Add amlodipine 5 mg daily Continue losartan 100 mg daily, chlorthalidone 25 mg daily Follow-up in clinic in 4 to 6 weeks for blood pressure check Patient asked to check blood pressure at home once a day and to bring his home blood pressure cuff and readings to next clinic appointment

## 2022-02-14 NOTE — Telephone Encounter (Signed)
PA for Repatha submitted Key: [id number]

## 2022-02-14 NOTE — Patient Instructions (Signed)
I will submit a prior authorization for Repatha. I will call you once I hear back. Please call me at [phone] with any questions.   Repatha is a cholesterol medication that improved your body's ability to get rid of "bad cholesterol" known as LDL. It can lower your LDL up to 60%! It is an injection that is given under the skin every 2 weeks. The medication often requires a prior authorization from your insurance company. We will take care of submitting all the necessary information to your insurance company to get it approved. The most common side effects of Repatha include runny nose, symptoms of the common cold, rarely flu or flu-like symptoms, back/muscle pain in about 3-4% of the patients, and redness, pain, or bruising at the injection site. Tell your healthcare provider if you have any side effect that bothers you or that does not go away.     To get your $5 Repatha copay card; Repatha.com Paying for Repatha (white bar across top).  Scroll down to "options for insurance situations" and click on "I have commercial or private insurance" - scroll down and click on the blue highlighted  "click here" to learn more about the Repatha Copay Card Do you have a prescription - Click YES then scroll down and mark the box "Repatha Copay Card", then continue to scroll down and enter the personal information. There are two blue boxes with "I agree" next to them. The first is optional if you want to enroll in their patient support program.  This one is voluntary.  The second is their patient authorization, and you must mark this one to continue.   Lastly they ask if they can contact you regarding information about market research about the drug or disease state.  You can mark either box. Click "next" and continue to follow steps to get your copay card Summary of today's discussion  1.Make sure you are taking atorvastatin and ezetimibe  2.Start taking amlodipine 5mg  daily  3.Continue losartan and  chlorthalidone  4.check blood pressure at home  5.Bring your blood pressure cuff with you to your next appointment   Your blood pressure goal is <130/80  To check your pressure at home you will need to:  1. Sit up in a chair, with feet flat on the floor and back supported. Do not cross your ankles or legs. 2. Rest your left arm so that the cuff is about heart level. If the cuff goes on your upper arm,  then just relax the arm on the table, arm of the chair or your lap. If you have a wrist cuff, we  suggest relaxing your wrist against your chest (think of it as Pledging the Flag with the  wrong arm).  3. Place the cuff snugly around your arm, about 1 inch above the crook of your elbow. The  cords should be inside the groove of your elbow.  4. Sit quietly, with the cuff in place, for about 5 minutes. After that 5 minutes press the power  button to start a reading. 5. Do not talk or move while the reading is taking place.  6. Record your readings on a sheet of paper. Although most cuffs have a memory, it is often  easier to see a pattern developing when the numbers are all in front of you.  7. You can repeat the reading after 1-3 minutes if it is recommended  Make sure your bladder is empty and you have not had caffeine or tobacco within  the last 30 min  Always bring your blood pressure log with you to your appointments. If you have not brought your monitor in to be double checked for accuracy, please bring it to your next appointment.  You can find a list of validated (accurate) blood pressure cuffs at PopPath.it   Important lifestyle changes to control high blood pressure  Intervention  Effect on the BP  Lose extra pounds and watch your waistline Weight loss is one of the most effective lifestyle changes for controlling blood pressure. If you're overweight or obese, losing even a small amount of weight can help reduce blood pressure. Blood pressure might go down by about 1  millimeter of mercury (mm Hg) with each kilogram (about 2.2 pounds) of weight lost.  Exercise regularly As a general goal, aim for at least 30 minutes of moderate physical activity every day. Regular physical activity can lower high blood pressure by about 5 to 8 mm Hg.  Eat a healthy diet Eating a diet rich in whole grains, fruits, vegetables, and low-fat dairy products and low in saturated fat and cholesterol. A healthy diet can lower high blood pressure by up to 11 mm Hg.  Reduce salt (sodium) in your diet Even a small reduction of sodium in the diet can improve heart health and reduce high blood pressure by about 5 to 6 mm Hg.  Limit alcohol One drink equals 12 ounces of beer, 5 ounces of wine, or 1.5 ounces of 80-proof liquor.  Limiting alcohol to less than one drink a day for women or two drinks a day for men can help lower blood pressure by about 4 mm Hg.   Please call me at [phone] with any questions.

## 2022-02-15 NOTE — Telephone Encounter (Signed)
Spoke with patient's wife (per DPR). Advised that medication was approved and I sent the prescription to his pharmacy. Wife stated she would let pt know.

## 2022-02-24 ENCOUNTER — Ambulatory Visit
Admission: EM | Admit: 2022-02-24 | Discharge: 2022-02-24 | Disposition: A | Discharge status: Home / Self Care | Payer: BC Managed Care – PPO | Attending: Nurse Practitioner | Admitting: Nurse Practitioner

## 2022-02-24 DIAGNOSIS — N4889 Other specified disorders of penis: Secondary | ICD-10-CM

## 2022-02-24 DIAGNOSIS — Z113 Encounter for screening for infections with a predominantly sexual mode of transmission: Secondary | ICD-10-CM | POA: Diagnosis not present

## 2022-02-24 NOTE — ED Provider Notes (Signed)
[facility]    CSN: [id number] Arrival date & time: 02/24/22  1513      History   Chief Complaint Chief Complaint  Patient presents with   SEXUALLY TRANSMITTED DISEASE    HPI Joseph Carr is a 52 y.o. male.   Subjective:  Joseph Carr is a 52 y.o. male who complains of penile irritation for 9 days.  He denies any penile discharge, abnormal smelling urine, burning with urination, foul smelling urine, frequency, hematuria, hesitancy, inability to void, incomplete bladder emptying, incontinence, suprapubic pressure, or urgency. Patient is sexually active with his wife.  He has a history of chlamydia many years ago as a teenager.  He reports being monogamous with his wife and thinks that she is faithful as well.  However, he is still concern for possible STD as "you never know."  The following portions of the patient's history were reviewed and updated as appropriate: allergies, current medications, past family history, past medical history, past social history, past surgical history, and problem list.      Past Medical History:  Diagnosis Date   At risk for noncompliance    CAD (coronary artery disease)    a. NSTEMI 10/2013 - DES to ramus intermedius.  b. 06/2014 Cath/PCI: LM nl, LAD 15p, RI patent stent prox, 76m (2.5x12 Promus Premier DES), LCX 40p, EF 55-65%.   Habitual alcohol use    Hyperlipidemia LDL goal <70    Hypertension    Hypertensive heart disease    mild LVH on MRI with no HOCM   Low TSH level    MVP (mitral valve prolapse)    Tobacco abuse     Patient Active Problem List   Diagnosis Date Noted   Hypertensive heart disease    Pain in left knee 03/07/2019   Hyperthyroidism 02/28/2019   Tobacco abuse 10/30/2013   CAD (coronary artery disease) 10/21/2013    Class: Acute   Presence of drug coated stent in Circumflex-Ramus Intermedius branch: Promus DES 2.5 mm x 20 mm (2.75 mm) placed 10/20/13 10/21/2013   Essential hypertension     Class:  Chronic   Hyperlipidemia LDL goal <70     Past Surgical History:  Procedure Laterality Date   CARDIAC CATHETERIZATION N/A 07/03/2014   Procedure: Left Heart Cath and Coronary Angiography;  Surgeon: Peter M Martinique, MD;  Location: [place] [facility];  Service: Cardiovascular;  Laterality: N/A;   CARDIAC CATHETERIZATION N/A 07/03/2014   Procedure: Coronary Stent Intervention;  Surgeon: Peter M Martinique, MD;  Location: [place] [facility];  Service: Cardiovascular;  Laterality: N/A;   CORONARY ANGIOPLASTY     FINGER FRACTURE SURGERY Left 2014   4th finger   LEFT HEART CATHETERIZATION WITH CORONARY ANGIOGRAM N/A 10/20/2013   Procedure: LEFT HEART CATHETERIZATION WITH CORONARY ANGIOGRAM;  Surgeon: Jettie Booze, MD;  Location: [facility] CATH [facility];  Service: Cardiovascular;  Laterality: N/A;   PERCUTANEOUS CORONARY STENT INTERVENTION (PCI-S)  07/03/2014   RAMUS         Home Medications    Prior to Admission medications   Medication Sig Start Date End Date Taking? Authorizing Provider  amLODipine (NORVASC) 5 MG tablet Take 1 tablet (5 mg total) by mouth daily. 02/14/22   Sueanne Margarita, MD  aspirin EC 81 MG tablet Take 1 tablet (81 mg total) by mouth daily. 10/26/15   Sueanne Margarita, MD  atorvastatin (LIPITOR) 80 MG tablet TAKE 1 TABLET BY MOUTH ONCE DAILY AT 6PM. 02/14/22   Turner,  Eber Hong, MD  carvedilol (COREG) 6.25 MG tablet Take 1 tablet (6.25 mg total) by mouth 2 (two) times daily with a meal. 06/24/21   Swinyer, Lanice Schwab, NP  chlorthalidone (HYGROTON) 25 MG tablet TAKE 1 TABLET BY MOUTH ONCE DAILY 06/24/21   Swinyer, Lanice Schwab, NP  Evolocumab (REPATHA SURECLICK) 315 MG/ML SOAJ Inject 140 mg into the skin every 14 (fourteen) days. 02/14/22   Sueanne Margarita, MD  ezetimibe (ZETIA) 10 MG tablet Take 1 tablet (10 mg total) by mouth daily. 02/14/22   Sueanne Margarita, MD  losartan (COZAAR) 100 MG tablet TAKE 1 TABLET BY MOUTH ONCE DAILY . 06/24/21   Swinyer, Lanice Schwab, NP  nitroGLYCERIN (NITROSTAT) 0.4  MG SL tablet DISSOLVE ONE TABLET UNDER THE TONGUE EVERY 5 MINUTES AS NEEDED FOR CHEST PAIN.  DO NOT EXCEED A TOTAL OF 3 DOSES IN 15 MINUTES 06/24/21   Swinyer, Lanice Schwab, NP    Family History Family History  Problem Relation Age of Onset   Heart attack Father    Hypertension Father    Stroke Father    Thyroid disease Neg Hx     Social History Social History   Tobacco Use   Smoking status: Light Smoker    Packs/day: 0.75    Years: 30.00    Total pack years: 22.50    Types: Cigarettes    Last attempt to quit: 07/03/2014    Years since quitting: 7.6   Smokeless tobacco: Never  Vaping Use   Vaping Use: Never used  Substance Use Topics   Alcohol use: Yes   Drug use: No     Allergies   Ace inhibitors   Review of Systems Review of Systems  Constitutional:  Negative for fever.  Gastrointestinal:  Negative for abdominal pain, nausea and vomiting.  Genitourinary:  Negative for decreased urine volume, difficulty urinating, dysuria, flank pain, frequency, genital sores, hematuria, penile discharge, penile pain, penile swelling, scrotal swelling, testicular pain and urgency.  Musculoskeletal:  Negative for myalgias.  All other systems reviewed and are negative.    Physical Exam Triage Vital Signs ED Triage Vitals [02/24/22 1544]  Enc Vitals Group     BP (!) 159/91     Pulse Rate 87     Resp 18     Temp 97.9 F (36.6 C)     Temp Source Oral     SpO2 97 %     Weight      Height      Head Circumference      Peak Flow      Pain Score 0     Pain Loc      Pain Edu?      Excl. in [place]?    No data found.  Updated Vital Signs BP (!) 159/91 (BP Location: Left Arm)   Pulse 87   Temp 97.9 F (36.6 C) (Oral)   Resp 18   SpO2 97%   Visual Acuity Right Eye Distance:   Left Eye Distance:   Bilateral Distance:    Right Eye Near:   Left Eye Near:    Bilateral Near:     Physical Exam Vitals reviewed.  Constitutional:      Appearance: Normal appearance.  HENT:      Head: Normocephalic.     Nose: Nose normal.  Cardiovascular:     Rate and Rhythm: Normal rate.  Pulmonary:     Effort: Pulmonary effort is normal.     Breath sounds: Normal breath  sounds.  Genitourinary:    Comments: Exam deferred; patient performed self-swab for testing  Musculoskeletal:        General: Normal range of motion.     Cervical back: Normal range of motion and neck supple.  Skin:    General: Skin is warm and dry.  Neurological:     General: No focal deficit present.     Mental Status: He is alert and oriented to person, place, and time.      UC Treatments / Results  Labs (all labs ordered are listed, but only abnormal results are displayed) Labs Reviewed  CYTOLOGY, (ORAL, ANAL, URETHRAL) ANCILLARY ONLY    EKG   Radiology No results found.  Procedures Procedures (including critical care time)  Medications Ordered in UC Medications - No data to display  Initial Impression / Assessment and Plan / UC Course  I have reviewed the triage vital signs and the nursing notes.  Pertinent labs & imaging results that were available during my care of the patient were reviewed by me and considered in my medical decision making (see chart for details).    52 yo male presenting with penile irritation. No other symptoms reported. Testing for chlamydia, gonorrhea and trichomonas pending. Patient advised to abstain from sexual activity until results are received. He should follow-up with urology if STI testing is negative and he continues to have symptoms.   Today's evaluation has revealed no signs of a dangerous process. Discussed diagnosis with patient and/or guardian. Patient and/or guardian aware of their diagnosis, possible red flag symptoms to watch out for and need for close follow up. Patient and/or guardian understands verbal and written discharge instructions. Patient and/or guardian comfortable with plan and disposition.  Patient and/or guardian has a clear mental  status at this time, good insight into illness (after discussion and teaching) and has clear judgment to make decisions regarding their care  Documentation was completed with the aid of voice recognition software. Transcription may contain typographical errors. Final Clinical Impressions(s) / UC Diagnoses   Final diagnoses:  Penile irritation  Screening examination for STD (sexually transmitted disease)     Discharge Instructions      Testing for gonorrhea, chlamydia and trichomonas is pending. You should not have any sexual activity until you receive the results of the tests. You will only be notified for positive results. You may go online to MyChart and review your results.  You should follow-up with urology if your STD testing is negative and you continue to have symptoms.      ED Prescriptions   None    PDMP not reviewed this encounter.   Lurline Idol, [place] 02/24/22 1616

## 2022-02-24 NOTE — Discharge Instructions (Addendum)
Testing for gonorrhea, chlamydia and trichomonas is pending. You should not have any sexual activity until you receive the results of the tests. You will only be notified for positive results. You may go online to [place] and review your results.  You should follow-up with urology if your STD testing is negative and you continue to have symptoms.

## 2022-02-24 NOTE — ED Triage Notes (Signed)
Patient presents to [facility] for STD testing. Pt states he has been having intermittent throbbing, pin/needle sensation, drainage x 1 week.   Denies urinary symptoms.

## 2022-02-27 LAB — CYTOLOGY, (ORAL, ANAL, URETHRAL) ANCILLARY ONLY
Chlamydia: NEGATIVE
Comment: NEGATIVE
Comment: NEGATIVE
Comment: NORMAL
Neisseria Gonorrhea: NEGATIVE
Trichomonas: NEGATIVE

## 2022-03-02 ENCOUNTER — Telehealth: Payer: Self-pay | Admitting: *Deleted

## 2022-03-02 ENCOUNTER — Other Ambulatory Visit: Payer: Self-pay | Admitting: Cardiology

## 2022-03-02 DIAGNOSIS — R030 Elevated blood-pressure reading, without diagnosis of hypertension: Secondary | ICD-10-CM

## 2022-03-02 DIAGNOSIS — I341 Nonrheumatic mitral (valve) prolapse: Secondary | ICD-10-CM

## 2022-03-02 DIAGNOSIS — I2510 Atherosclerotic heart disease of native coronary artery without angina pectoris: Secondary | ICD-10-CM

## 2022-03-02 DIAGNOSIS — E785 Hyperlipidemia, unspecified: Secondary | ICD-10-CM

## 2022-03-02 DIAGNOSIS — I10 Essential (primary) hypertension: Secondary | ICD-10-CM

## 2022-03-02 NOTE — Telephone Encounter (Signed)
LMVM- calling to schedule 24 hour ambulatory blood pressure monitor.  Patient has bp f/u pharmacy appointment 03/31/22 and we would like to have the bp monitor completed before that date.  Please call  in monitors at [phone] to schedule.

## 2022-03-31 ENCOUNTER — Ambulatory Visit: Payer: BC Managed Care – PPO

## 2022-04-28 ENCOUNTER — Ambulatory Visit
Admission: EM | Admit: 2022-04-28 | Discharge: 2022-04-28 | Disposition: A | Discharge status: Home / Self Care | Payer: BC Managed Care – PPO | Attending: Internal Medicine | Admitting: Internal Medicine

## 2022-04-28 ENCOUNTER — Encounter: Payer: Self-pay | Admitting: Emergency Medicine

## 2022-04-28 DIAGNOSIS — M546 Pain in thoracic spine: Secondary | ICD-10-CM | POA: Diagnosis not present

## 2022-04-28 DIAGNOSIS — T148XXA Other injury of unspecified body region, initial encounter: Secondary | ICD-10-CM

## 2022-04-28 DIAGNOSIS — M25512 Pain in left shoulder: Secondary | ICD-10-CM | POA: Diagnosis not present

## 2022-04-28 MED ORDER — METHYLPREDNISOLONE ACETATE 80 MG/ML IJ SUSP
80.0000 mg | Freq: Once | INTRAMUSCULAR | Status: AC
  Administered 2022-04-28: 80 mg via INTRAMUSCULAR

## 2022-04-28 NOTE — ED Provider Notes (Signed)
[facility]    CSN: [id number] Arrival date & time: 04/28/22  1608      History   Chief Complaint Chief Complaint  Patient presents with   Extremity Pain    HPI Joseph Carr is a 52 y.o. male.   Patient presents with left shoulder pain that radiates up into left neck and down to left thoracic back that has been present for about 3 weeks.  He denies history of chronic pain in this area or any recent injury.  Patient denies that he does a lot of heavy lifting, pushing, pulling on a daily basis.  Patient reports that he has taken ibuprofen, his wife's muscle relaxer, used IcyHot, used heating pad with no improvement in symptoms.  Denies numbness or tingling.  Not reporting chest pain or shortness of breath. Movement does exacerbate pain.    Extremity Pain    Past Medical History:  Diagnosis Date   At risk for noncompliance    CAD (coronary artery disease)    a. NSTEMI 10/2013 - DES to ramus intermedius.  b. 06/2014 Cath/PCI: LM nl, LAD 15p, RI patent stent prox, 41m (2.5x12 Promus Premier DES), LCX 40p, EF 55-65%.   Habitual alcohol use    Hyperlipidemia LDL goal <70    Hypertension    Hypertensive heart disease    mild LVH on MRI with no HOCM   Low TSH level    MVP (mitral valve prolapse)    Tobacco abuse     Patient Active Problem List   Diagnosis Date Noted   Hypertensive heart disease    Pain in left knee 03/07/2019   Hyperthyroidism 02/28/2019   Tobacco abuse 10/30/2013   CAD (coronary artery disease) 10/21/2013    Class: Acute   Presence of drug coated stent in Circumflex-Ramus Intermedius branch: Promus DES 2.5 mm x 20 mm (2.75 mm) placed 10/20/13 10/21/2013   Essential hypertension     Class: Chronic   Hyperlipidemia LDL goal <70     Past Surgical History:  Procedure Laterality Date   CARDIAC CATHETERIZATION N/A 07/03/2014   Procedure: Left Heart Cath and Coronary Angiography;  Surgeon: Peter M Martinique, MD;  Location: [place] [facility];   Service: Cardiovascular;  Laterality: N/A;   CARDIAC CATHETERIZATION N/A 07/03/2014   Procedure: Coronary Stent Intervention;  Surgeon: Peter M Martinique, MD;  Location: [place] [facility];  Service: Cardiovascular;  Laterality: N/A;   CORONARY ANGIOPLASTY     FINGER FRACTURE SURGERY Left 2014   4th finger   LEFT HEART CATHETERIZATION WITH CORONARY ANGIOGRAM N/A 10/20/2013   Procedure: LEFT HEART CATHETERIZATION WITH CORONARY ANGIOGRAM;  Surgeon: Jettie Booze, MD;  Location: [facility];  Service: Cardiovascular;  Laterality: N/A;   PERCUTANEOUS CORONARY STENT INTERVENTION (PCI-S)  07/03/2014   RAMUS         Home Medications    Prior to Admission medications   Medication Sig Start Date End Date Taking? Authorizing Provider  amLODipine (NORVASC) 5 MG tablet Take 1 tablet (5 mg total) by mouth daily. 02/14/22   Sueanne Margarita, MD  aspirin EC 81 MG tablet Take 1 tablet (81 mg total) by mouth daily. 10/26/15   Sueanne Margarita, MD  atorvastatin (LIPITOR) 80 MG tablet TAKE 1 TABLET BY MOUTH ONCE DAILY AT 6PM. 02/14/22   Sueanne Margarita, MD  carvedilol (COREG) 6.25 MG tablet Take 1 tablet (6.25 mg total) by mouth 2 (two) times daily with a meal. 06/24/21   Swinyer, Sharyn Lull  M, NP  chlorthalidone (HYGROTON) 25 MG tablet TAKE 1 TABLET BY MOUTH ONCE DAILY 06/24/21   Swinyer, Lanice Schwab, NP  Evolocumab (REPATHA SURECLICK) [id number] MG/ML SOAJ Inject 140 mg into the skin every 14 (fourteen) days. 02/14/22   Sueanne Margarita, MD  ezetimibe (ZETIA) 10 MG tablet Take 1 tablet (10 mg total) by mouth daily. 02/14/22   Sueanne Margarita, MD  losartan (COZAAR) 100 MG tablet TAKE 1 TABLET BY MOUTH ONCE DAILY . 06/24/21   Swinyer, Lanice Schwab, NP  nitroGLYCERIN (NITROSTAT) 0.4 MG SL tablet DISSOLVE ONE TABLET UNDER THE TONGUE EVERY 5 MINUTES AS NEEDED FOR CHEST PAIN.  DO NOT EXCEED A TOTAL OF 3 DOSES IN 15 MINUTES 06/24/21   Swinyer, Lanice Schwab, NP    Family History Family History  Problem Relation Age of Onset   Heart  attack Father    Hypertension Father    Stroke Father    Thyroid disease Neg Hx     Social History Social History   Tobacco Use   Smoking status: Light Smoker    Packs/day: 0.75    Years: 30.00    Additional pack years: 0.00    Total pack years: 22.50    Types: Cigarettes    Last attempt to quit: 07/03/2014    Years since quitting: 7.8   Smokeless tobacco: Never  Vaping Use   Vaping Use: Never used  Substance Use Topics   Alcohol use: Yes   Drug use: No     Allergies   Ace inhibitors   Review of Systems Review of Systems Per HPI  Physical Exam Triage Vital Signs ED Triage Vitals  Enc Vitals Group     BP 04/28/22 1630 (!) 163/108     Pulse Rate 04/28/22 1630 92     Resp 04/28/22 1630 15     Temp 04/28/22 1630 98.4 F (36.9 C)     Temp Source 04/28/22 1630 Oral     SpO2 04/28/22 1630 96 %     Weight --      Height --      Head Circumference --      Peak Flow --      Pain Score 04/28/22 1629 8     Pain Loc --      Pain Edu? --      Excl. in [place]? --    No data found.  Updated Vital Signs BP (!) 145/96 (BP Location: Right Arm)   Pulse 92   Temp 98.4 F (36.9 C) (Oral)   Resp 15   SpO2 96%   Visual Acuity Right Eye Distance:   Left Eye Distance:   Bilateral Distance:    Right Eye Near:   Left Eye Near:    Bilateral Near:     Physical Exam Constitutional:      General: He is not in acute distress.    Appearance: Normal appearance. He is not toxic-appearing or diaphoretic.  HENT:     Head: Normocephalic and atraumatic.  Eyes:     Extraocular Movements: Extraocular movements intact.     Conjunctiva/sclera: Conjunctivae normal.  Pulmonary:     Effort: Pulmonary effort is normal.  Musculoskeletal:       Back:     Comments: Has tenderness to palpation to left lateral neck that extends into left upper thoracic back/trapezius muscle.  There is no obvious swelling or discoloration noted.  Pain with range of motion of shoulder.  Patient also has  minimal tenderness to palpation  to left lateral shoulder.  No crepitus noted.  No direct spinal tenderness, crepitus, step-off noted.  Grip strength is 5/5.  Neurovascular intact.  Patient has full range of motion of neck.  Neurological:     General: No focal deficit present.     Mental Status: He is alert and oriented to person, place, and time. Mental status is at baseline.  Psychiatric:        Mood and Affect: Mood normal.        Behavior: Behavior normal.        Thought Content: Thought content normal.        Judgment: Judgment normal.      UC Treatments / Results  Labs (all labs ordered are listed, but only abnormal results are displayed) Labs Reviewed - No data to display  EKG   Radiology No results found.  Procedures Procedures (including critical care time)  Medications Ordered in UC Medications  methylPREDNISolone acetate (DEPO-MEDROL) injection 80 mg (80 mg Intramuscular Given 04/28/22 1647)    Initial Impression / Assessment and Plan / UC Course  I have reviewed the triage vital signs and the nursing notes.  Pertinent labs & imaging results that were available during my care of the patient were reviewed by me and considered in my medical decision making (see chart for details).     Suspect muscle strain/inflammation given pain is reproducible with palpation.  No concern for cardiac etiology at this time.  Patient has used NSAIDs, topical medications, heating pad, muscle relaxer with no improvement.  Therefore, I do think the patient would benefit from steroid therapy.  Patient denies that he has ever taken steroids before but no obvious contraindication to steroid therapy in patient's history and patient's last EF is 56% so it should be safe.  IM Solu-Medrol administered in urgent care.  Patient advised to follow-up with orthopedist at provided contact information as well given duration of symptoms and symptoms being refractory to medications.  Blood pressure recheck  was improved and blood pressure is most likely elevated due to pain.  Patient verbalized understanding and was agreeable with plan. Final Clinical Impressions(s) / UC Diagnoses   Final diagnoses:  Muscle strain  Acute left-sided thoracic back pain  Acute pain of left shoulder     Discharge Instructions      Steroid injection was given today to decrease inflammation.  Please follow-up with orthopedist on Monday to schedule appointment for further evaluation and management of this given duration of symptoms.    ED Prescriptions   None    PDMP not reviewed this encounter.   Teodora Medici, [place] 04/28/22 [phone]

## 2022-04-28 NOTE — Discharge Instructions (Signed)
Steroid injection was given today to decrease inflammation.  Please follow-up with orthopedist on Monday to schedule appointment for further evaluation and management of this given duration of symptoms.

## 2022-04-28 NOTE — ED Triage Notes (Signed)
Pt presents with left shoulder pain that radiates up into neck and back down into left arm Xs 3 weeks. States has tried ibuprofen, muscles relaxers, icy hot with no relief.

## 2022-05-02 ENCOUNTER — Ambulatory Visit: Payer: BC Managed Care – PPO | Admitting: Physical Medicine and Rehabilitation

## 2022-05-10 ENCOUNTER — Ambulatory Visit: Payer: BC Managed Care – PPO | Admitting: Physical Medicine and Rehabilitation

## 2022-05-12 ENCOUNTER — Ambulatory Visit: Payer: BC Managed Care – PPO | Attending: Internal Medicine

## 2022-05-12 NOTE — Progress Notes (Deleted)
Patient ID: Joseph Carr                 DOB: 1970/07/14                    MRN: [id number]      HPI: Joseph RolesJames M Carr is a 52 y.o. male patient referred to lipid clinic by Dr. Mayford Knifeurner. PMH is significant for CAD (s/p NSTEMI 10/2013 treated with DES to RI, repeat cath for chest pain 2016 s/p DES to ramus-2 just distal to prior stent), HTN, HLD, possible hyperthyroidism, tobacco abuse, habitual alcohol intake, and intermittent noncompliance.   Patient had repeat liver labs in November that showed an increase in LDL-C.  He presents today to lipid clinic.  There seems to be some confusion as to if he is taking both ezetimibe and atorvastatin.  He had previously been on Repatha.  Seems like it was stopped because he did not get repeat labs.  Patient patient prefers to be on shot per his report.  Reports blood pressure at home in the 130s and 140s/80-90's.   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, up and down the fork lift  Family History:  Family History  Problem Relation Age of Onset   Heart attack Father    Hypertension Father    Stroke Father    Thyroid disease Neg Hx     Social History: smokes maybe 3 days per week, 2 beers per day (small "pony bottles)  Labs: Lipid Panel     Component Value Date/Time   CHOL 188 12/23/2021 1233   TRIG 102 12/23/2021 1233   HDL 54 12/23/2021 1233   CHOLHDL 3.5 12/23/2021 1233   CHOLHDL 4.0 02/12/2015 1257   VLDL 18 02/12/2015 1257   LDLCALC 116 (H) 12/23/2021 1233   LABVLDL 18  12/23/2021 1233    Past Medical History:  Diagnosis Date   At risk for noncompliance    CAD (coronary artery disease)    a. NSTEMI 10/2013 - DES to ramus intermedius.  b. 06/2014 Cath/PCI: LM nl, LAD 15p, RI patent stent prox, 5339m (2.5x12 Promus Premier DES), LCX 40p, EF 55-65%.   Habitual alcohol use    Hyperlipidemia LDL goal <70    Hypertension    Hypertensive heart disease    mild LVH on MRI with no HOCM   Low TSH level    MVP (mitral valve prolapse)    Tobacco abuse     Current Outpatient Medications on File Prior to Visit  Medication Sig Dispense Refill   amLODipine (NORVASC) 5 MG tablet Take 1 tablet (5 mg total) by mouth daily. 90 tablet 3   aspirin EC 81 MG tablet Take 1 tablet (81 mg total) by mouth daily.     atorvastatin (LIPITOR) 80 MG tablet TAKE 1 TABLET BY MOUTH ONCE DAILY AT 6PM. 90 tablet 3   carvedilol (COREG) 6.25 MG tablet Take 1 tablet (6.25 mg total) by mouth 2 (two) times daily with a meal. 60 tablet 11   chlorthalidone (HYGROTON) 25 MG tablet TAKE 1 TABLET BY MOUTH ONCE DAILY 30 tablet  11   Evolocumab (REPATHA SURECLICK) 140 MG/ML SOAJ Inject 140 mg into the skin every 14 (fourteen) days. 2 mL 11   ezetimibe (ZETIA) 10 MG tablet Take 1 tablet (10 mg total) by mouth daily. 90 tablet 3   losartan (COZAAR) 100 MG tablet TAKE 1 TABLET BY MOUTH ONCE DAILY . 30 tablet 11   nitroGLYCERIN (NITROSTAT) 0.4 MG SL tablet DISSOLVE ONE TABLET UNDER THE TONGUE EVERY 5 MINUTES AS NEEDED FOR CHEST PAIN.  DO NOT EXCEED A TOTAL OF 3 DOSES IN 15 MINUTES 25 tablet 3   No current facility-administered medications on file prior to visit.    Allergies  Allergen Reactions   Ace Inhibitors Swelling    Lip swelling that resolved with benadryl, did not compromise his breathing    Assessment/Plan:  1. Hyperlipidemia -  No problem-specific Assessment & Plan notes found for this encounter.   ED  Patient asking if it is okay for him to take Viagra for ED.  He received a  prescription from an online doctor.  Advised that is okay for him to take.  But he cannot take within 72 hours of his nitroglycerin or will cause significantly low blood pressure.  If he experiences chest pain he is to call 911   Thank you,  Olene Floss, Pharm.D, BCPS, CPP [facility] [address] [place], [place] [address]  Phone: [phone]; Fax: [phone]

## 2022-05-15 ENCOUNTER — Encounter: Payer: Self-pay | Admitting: Cardiology

## 2022-07-12 ENCOUNTER — Other Ambulatory Visit: Payer: Self-pay | Admitting: Nurse Practitioner

## 2022-07-25 ENCOUNTER — Other Ambulatory Visit: Payer: Self-pay | Admitting: Nurse Practitioner

## 2022-07-25 DIAGNOSIS — I10 Essential (primary) hypertension: Secondary | ICD-10-CM

## 2022-09-18 ENCOUNTER — Ambulatory Visit (INDEPENDENT_AMBULATORY_CARE_PROVIDER_SITE_OTHER): Payer: BC Managed Care – PPO

## 2022-09-18 ENCOUNTER — Ambulatory Visit
Admission: EM | Admit: 2022-09-18 | Discharge: 2022-09-18 | Disposition: A | Discharge status: Home / Self Care | Payer: BC Managed Care – PPO | Attending: Physician Assistant | Admitting: Physician Assistant

## 2022-09-18 DIAGNOSIS — M79675 Pain in left toe(s): Secondary | ICD-10-CM | POA: Diagnosis not present

## 2022-09-18 DIAGNOSIS — M2012 Hallux valgus (acquired), left foot: Secondary | ICD-10-CM | POA: Diagnosis not present

## 2022-09-18 DIAGNOSIS — L03032 Cellulitis of left toe: Secondary | ICD-10-CM

## 2022-09-18 DIAGNOSIS — M7732 Calcaneal spur, left foot: Secondary | ICD-10-CM | POA: Diagnosis not present

## 2022-09-18 DIAGNOSIS — M19072 Primary osteoarthritis, left ankle and foot: Secondary | ICD-10-CM | POA: Diagnosis not present

## 2022-09-18 MED ORDER — DOXYCYCLINE HYCLATE 100 MG PO CAPS: 100.0000 mg | ORAL_CAPSULE | Freq: Two times a day (BID) | ORAL | 0 refills | Status: DC

## 2022-09-18 NOTE — ED Triage Notes (Signed)
"  My left great toe hurts a lot with swelling, redness, whole toe".

## 2022-09-19 ENCOUNTER — Encounter: Payer: Self-pay | Admitting: Physician Assistant

## 2022-09-19 NOTE — ED Provider Notes (Signed)
[facility]    CSN: [id number] Arrival date & time: 09/18/22  1638      History   Chief Complaint Chief Complaint  Patient presents with   Toe Problem (Swelling)    HPI Joseph Carr is a 52 y.o. male.   Patient here today for evaluation of pain and swelling to his left fourth toe.  He states that symptoms started a few days ago and have worsened with time.  He is unsure of any injury.  He denies any numbness or tingling.  He does not report treatment for symptoms.  He denies fever.  The history is provided by the patient.    Past Medical History:  Diagnosis Date   At risk for noncompliance    CAD (coronary artery disease)    a. NSTEMI 10/2013 - DES to ramus intermedius.  b. 06/2014 Cath/PCI: LM nl, LAD 15p, RI patent stent prox, 85m (2.5x12 Promus Premier DES), LCX 40p, EF 55-65%.   Habitual alcohol use    Hyperlipidemia LDL goal <70    Hypertension    Hypertensive heart disease    mild LVH on MRI with no HOCM   Low TSH level    MVP (mitral valve prolapse)    Tobacco abuse     Patient Active Problem List   Diagnosis Date Noted   Hypertensive heart disease    Pain in left knee 03/07/2019   Hyperthyroidism 02/28/2019   Tobacco abuse 10/30/2013   CAD (coronary artery disease) 10/21/2013    Class: Acute   Presence of drug coated stent in Circumflex-Ramus Intermedius branch: Promus DES 2.5 mm x 20 mm (2.75 mm) placed 10/20/13 10/21/2013   Essential hypertension     Class: Chronic   Hyperlipidemia LDL goal <70     Past Surgical History:  Procedure Laterality Date   CARDIAC CATHETERIZATION N/A 07/03/2014   Procedure: Left Heart Cath and Coronary Angiography;  Surgeon: Peter M Swaziland, MD;  Location: [facility] INVASIVE CV LAB;  Service: Cardiovascular;  Laterality: N/A;   CARDIAC CATHETERIZATION N/A 07/03/2014   Procedure: Coronary Stent Intervention;  Surgeon: Peter M Swaziland, MD;  Location: [facility] INVASIVE CV LAB;  Service: Cardiovascular;  Laterality: N/A;    CORONARY ANGIOPLASTY     FINGER FRACTURE SURGERY Left 2014   4th finger   LEFT HEART CATHETERIZATION WITH CORONARY ANGIOGRAM N/A 10/20/2013   Procedure: LEFT HEART CATHETERIZATION WITH CORONARY ANGIOGRAM;  Surgeon: Corky Crafts, MD;  Location: [facility] CATH LAB;  Service: Cardiovascular;  Laterality: N/A;   PERCUTANEOUS CORONARY STENT INTERVENTION (PCI-S)  07/03/2014   RAMUS         Home Medications    Prior to Admission medications   Medication Sig Start Date End Date Taking? Authorizing Provider  doxycycline (VIBRAMYCIN) 100 MG capsule Take 1 capsule (100 mg total) by mouth 2 (two) times daily. 09/18/22  Yes Tomi Bamberger, PA-C  amLODipine (NORVASC) 5 MG tablet Take 1 tablet (5 mg total) by mouth daily. 02/14/22   Quintella Reichert, MD  aspirin EC 81 MG tablet Take 1 tablet (81 mg total) by mouth daily. 10/26/15   Quintella Reichert, MD  atorvastatin (LIPITOR) 80 MG tablet TAKE 1 TABLET BY MOUTH ONCE DAILY AT 6PM. 02/14/22   Quintella Reichert, MD  carvedilol (COREG) 6.25 MG tablet TAKE 1 TABLET BY MOUTH TWICE DAILY WITH A MEAL 07/12/22   Swinyer, Zachary George, NP  chlorthalidone (HYGROTON) 25 MG tablet Take 1 tablet by mouth once daily 07/12/22  Swinyer, Zachary George, NP  Evolocumab (REPATHA SURECLICK) 140 MG/ML SOAJ Inject 140 mg into the skin every 14 (fourteen) days. 02/14/22   Quintella Reichert, MD  ezetimibe (ZETIA) 10 MG tablet Take 1 tablet (10 mg total) by mouth daily. 02/14/22   Quintella Reichert, MD  losartan (COZAAR) 100 MG tablet TAKE 1 TABLET BY MOUTH ONCE DAILY, PT. WILL NEED TO MAKE AN APPOINTMENT IN ORDER TO RECEIVE FUTURE REFILLS. FIRST ATTEMPT. 07/26/22   Swinyer, Zachary George, NP  nitroGLYCERIN (NITROSTAT) 0.4 MG SL tablet DISSOLVE ONE TABLET UNDER THE TONGUE EVERY 5 MINUTES AS NEEDED FOR CHEST PAIN.  DO NOT EXCEED A TOTAL OF 3 DOSES IN 15 MINUTES 06/24/21   Swinyer, Zachary George, NP    Family History Family History  Problem Relation Age of Onset   Heart attack Father    Hypertension Father     Stroke Father    Thyroid disease Neg Hx     Social History Social History   Tobacco Use   Smoking status: Light Smoker    Current packs/day: 0.00    Average packs/day: 0.8 packs/day for 30.0 years (22.5 ttl pk-yrs)    Types: Cigarettes    Start date: 07/02/1984    Last attempt to quit: 07/03/2014    Years since quitting: 8.2   Smokeless tobacco: Never  Vaping Use   Vaping status: Never Used  Substance Use Topics   Alcohol use: Yes   Drug use: No     Allergies   Ace inhibitors   Review of Systems Review of Systems  Constitutional:  Negative for chills and fever.  Eyes:  Negative for discharge and redness.  Respiratory:  Negative for shortness of breath.   Gastrointestinal:  Negative for nausea and vomiting.  Skin:  Positive for color change. Negative for wound.  Neurological:  Negative for numbness.     Physical Exam Triage Vital Signs ED Triage Vitals  Encounter Vitals Group     BP 09/18/22 1648 (!) 158/103     Systolic BP Percentile --      Diastolic BP Percentile --      Pulse Rate 09/18/22 1648 (!) 106     Resp 09/18/22 1648 18     Temp 09/18/22 1648 98.8 F (37.1 C)     Temp Source 09/18/22 1648 Oral     SpO2 09/18/22 1648 97 %     Weight 09/18/22 1647 148 lb (67.1 kg)     Height 09/18/22 1647 5\' 9"  (1.753 m)     Head Circumference --      Peak Flow --      Pain Score 09/18/22 1647 9     Pain Loc --      Pain Education --      Exclude from Growth Chart --    No data found.  Updated Vital Signs BP (!) 148/97 (BP Location: Left Arm)   Pulse (!) 106   Temp 98.8 F (37.1 C) (Oral)   Resp 18   Ht 5\' 9"  (1.753 m)   Wt 148 lb (67.1 kg)   SpO2 97%   BMI 21.86 kg/m      Physical Exam Vitals and nursing note reviewed.  Constitutional:      General: He is not in acute distress.    Appearance: Normal appearance. He is not ill-appearing.  HENT:     Head: Normocephalic and atraumatic.  Eyes:     Conjunctiva/sclera: Conjunctivae normal.   Cardiovascular:     Rate  and Rhythm: Normal rate.  Pulmonary:     Effort: Pulmonary effort is normal. No respiratory distress.  Musculoskeletal:     Comments: Diffuse swelling and erythema to left fourth toe.  Tenderness to palpation noted.  Decreased range of motion due to swelling.  Skin:    Capillary Refill: Normal cap refill to left fourth toe Neurological:     Mental Status: He is alert.     Comments: Gross sensation intact to distal left fourth toe  Psychiatric:        Mood and Affect: Mood normal.        Behavior: Behavior normal.        Thought Content: Thought content normal.      UC Treatments / Results  Labs (all labs ordered are listed, but only abnormal results are displayed) Labs Reviewed - No data to display  EKG   Radiology DG Foot Complete Left  Result Date: 09/18/2022 CLINICAL DATA:  Great toe pain swelling and redness EXAM: LEFT FOOT - COMPLETE 3+ VIEW COMPARISON:  None Available. FINDINGS: Mild hallux valgus deformity with minimal osteoarthritis of the first metatarsophalangeal joint. No acute fracture or dislocation. Tiny calcaneal spur. 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 Electronically Signed   By: Jeronimo Greaves M.D.   On: 09/18/2022 17:34    Procedures Procedures (including critical care time)  Medications Ordered in UC Medications - No data to display  Initial Impression / Assessment and Plan / UC Course  I have reviewed the triage vital signs and the nursing notes.  Pertinent labs & imaging results that were available during my care of the patient were reviewed by me and considered in my medical decision making (see chart for details).    X-ray negative for fracture.  Will treat to cover possible cellulitis.  Encouraged follow-up if no gradual improvement or with any further concerns.  Final Clinical Impressions(s) / UC Diagnoses   Final diagnoses:  Cellulitis of toe of left foot   Discharge  Instructions   None    ED Prescriptions     Medication Sig Dispense Auth. Provider   doxycycline (VIBRAMYCIN) 100 MG capsule Take 1 capsule (100 mg total) by mouth 2 (two) times daily. 20 capsule Tomi Bamberger, PA-C      PDMP not reviewed this encounter.   Tomi Bamberger, PA-C 09/19/22 [phone]

## 2022-09-21 ENCOUNTER — Emergency Department (HOSPITAL_COMMUNITY)
Admission: EM | Admit: 2022-09-21 | Discharge: 2022-09-21 | Disposition: A | Discharge status: Home / Self Care | Payer: BC Managed Care – PPO | Attending: Emergency Medicine | Admitting: Emergency Medicine

## 2022-09-21 ENCOUNTER — Other Ambulatory Visit: Payer: Self-pay

## 2022-09-21 DIAGNOSIS — I10 Essential (primary) hypertension: Secondary | ICD-10-CM | POA: Insufficient documentation

## 2022-09-21 DIAGNOSIS — M79675 Pain in left toe(s): Secondary | ICD-10-CM | POA: Diagnosis not present

## 2022-09-21 DIAGNOSIS — I2510 Atherosclerotic heart disease of native coronary artery without angina pectoris: Secondary | ICD-10-CM | POA: Insufficient documentation

## 2022-09-21 DIAGNOSIS — M109 Gout, unspecified: Secondary | ICD-10-CM | POA: Insufficient documentation

## 2022-09-21 DIAGNOSIS — Z79899 Other long term (current) drug therapy: Secondary | ICD-10-CM | POA: Insufficient documentation

## 2022-09-21 LAB — COMPREHENSIVE METABOLIC PANEL
ALT: 21 U/L (ref 0–44)
AST: 28 U/L (ref 15–41)
Albumin: 4 g/dL (ref 3.5–5.0)
Alkaline Phosphatase: 52 U/L (ref 38–126)
Anion gap: 13 (ref 5–15)
BUN: 10 mg/dL (ref 6–20)
CO2: 20 mmol/L — ABNORMAL LOW (ref 22–32)
Calcium: 9.2 mg/dL (ref 8.9–10.3)
Chloride: 104 mmol/L (ref 98–111)
Creatinine, Ser: 1.07 mg/dL (ref 0.61–1.24)
GFR, Estimated: >60 mL/min (ref >60)
Glucose, Bld: 75 mg/dL (ref 70–99)
Potassium: 3.6 mmol/L (ref 3.5–5.1)
Sodium: 137 mmol/L (ref 135–145)
Total Bilirubin: 0.7 mg/dL (ref 0.3–1.2)
Total Protein: 7.7 g/dL (ref 6.5–8.1)

## 2022-09-21 LAB — URIC ACID: Uric Acid, Serum: 10.3 mg/dL — ABNORMAL HIGH (ref 3.7–8.6)

## 2022-09-21 MED ORDER — PREDNISONE 20 MG PO TABS
60.0000 mg | ORAL_TABLET | Freq: Once | ORAL | Status: AC
  Administered 2022-09-21: 60 mg via ORAL
  Filled 2022-09-21: qty 3

## 2022-09-21 MED ORDER — COLCHICINE 0.6 MG PO TABS
1.2000 mg | ORAL_TABLET | ORAL | Status: AC
  Administered 2022-09-21: 1.2 mg via ORAL
  Filled 2022-09-21: qty 2

## 2022-09-21 MED ORDER — HYDROCODONE-ACETAMINOPHEN 5-325 MG PO TABS
1.0000 | ORAL_TABLET | Freq: Once | ORAL | Status: AC
  Administered 2022-09-21: 1 via ORAL
  Filled 2022-09-21: qty 1

## 2022-09-21 MED ORDER — PREDNISONE 10 MG PO TABS: ORAL_TABLET | ORAL | 0 refills | Status: AC

## 2022-09-21 MED ORDER — COLCHICINE 0.6 MG PO TABS
0.6000 mg | ORAL_TABLET | ORAL | Status: AC
  Administered 2022-09-21: 0.6 mg via ORAL
  Filled 2022-09-21: qty 1

## 2022-09-21 NOTE — ED Provider Notes (Signed)
[place] EMERGENCY DEPARTMENT AT [facility] Provider Note   CSN: [id number] Arrival date & time: 09/21/22  3664     History  Chief Complaint  Patient presents with   Foot Injury    Joseph Carr is a 52 y.o. male.  52 year old male with a history of CAD status post PCI, hypertension, hyperlipidemia, and chronic alcohol use who presents to the emergency department with toe pain.  States that on Saturday started experiencing some discomfort on one of his left toes.  Says that it gradually worsened.  Was seen in urgent care on 8/12 and was given some doxycycline which she has been taking but reports that the pain is gradually worsened.  Says that it has become more swollen and red.  Denies any fevers.  No trauma to his foot.  Did have x-rays urgent care today did not show fracture.  Says that he drinks approximately 4 beers per day.  No history of gastric ulcers.       Home Medications Prior to Admission medications   Medication Sig Start Date End Date Taking? Authorizing Provider  predniSONE (DELTASONE) 10 MG tablet Take 4 tablets (40 mg total) by mouth daily for 4 days, THEN 2 tablets (20 mg total) daily for 4 days, THEN 1 tablet (10 mg total) daily for 4 days. 09/21/22 10/03/22 Yes Rondel Baton, MD  amLODipine (NORVASC) 5 MG tablet Take 1 tablet (5 mg total) by mouth daily. 02/14/22   Quintella Reichert, MD  aspirin EC 81 MG tablet Take 1 tablet (81 mg total) by mouth daily. 10/26/15   Quintella Reichert, MD  atorvastatin (LIPITOR) 80 MG tablet TAKE 1 TABLET BY MOUTH ONCE DAILY AT 6PM. 02/14/22   Quintella Reichert, MD  carvedilol (COREG) 6.25 MG tablet TAKE 1 TABLET BY MOUTH TWICE DAILY WITH A MEAL 07/12/22   Swinyer, Zachary George, NP  chlorthalidone (HYGROTON) 25 MG tablet Take 1 tablet by mouth once daily 07/12/22   Swinyer, Zachary George, NP  doxycycline (VIBRAMYCIN) 100 MG capsule Take 1 capsule (100 mg total) by mouth 2 (two) times daily. 09/18/22   Tomi Bamberger, PA-C   Evolocumab (REPATHA SURECLICK) 140 MG/ML SOAJ Inject 140 mg into the skin every 14 (fourteen) days. 02/14/22   Quintella Reichert, MD  ezetimibe (ZETIA) 10 MG tablet Take 1 tablet (10 mg total) by mouth daily. 02/14/22   Quintella Reichert, MD  losartan (COZAAR) 100 MG tablet TAKE 1 TABLET BY MOUTH ONCE DAILY, PT. WILL NEED TO MAKE AN APPOINTMENT IN ORDER TO RECEIVE FUTURE REFILLS. FIRST ATTEMPT. 07/26/22   Swinyer, Zachary George, NP  nitroGLYCERIN (NITROSTAT) 0.4 MG SL tablet DISSOLVE ONE TABLET UNDER THE TONGUE EVERY 5 MINUTES AS NEEDED FOR CHEST PAIN.  DO NOT EXCEED A TOTAL OF 3 DOSES IN 15 MINUTES 06/24/21   Swinyer, Zachary George, NP      Allergies    Ace inhibitors    Review of Systems   Review of Systems  Physical Exam Updated Vital Signs BP (!) 152/102 (BP Location: Right Arm)   Pulse 72   Temp 97.8 F (36.6 C) (Oral)   Resp 16   SpO2 100%  Physical Exam Musculoskeletal:     Comments: DP pulses 2+ bilaterally.  All 10 toes appear warm and well-perfused with cap refill less than 2 seconds in all 10 toes.  Erythema and exquisite tenderness to palpation even to light touch of the left third toe.  See image below.  Left foot   ED Results / Procedures / Treatments   Labs (all labs ordered are listed, but only abnormal results are displayed) Labs Reviewed  COMPREHENSIVE METABOLIC PANEL - Abnormal; Notable for the following components:      Result Value   CO2 20 (*)    All other components within normal limits  URIC ACID - Abnormal; Notable for the following components:   Uric Acid, Serum 10.3 (*)    All other components within normal limits    EKG None  Radiology No results found.  Procedures Procedures    Medications Ordered in ED Medications  HYDROcodone-acetaminophen (NORCO/VICODIN) 5-325 MG per tablet 1 tablet (1 tablet Oral Given 09/21/22 0923)  predniSONE (DELTASONE) tablet 60 mg (60 mg Oral Given 09/21/22 0923)  colchicine tablet 1.2 mg (1.2 mg Oral Given 09/21/22 1142)   colchicine tablet 0.6 mg (0.6 mg Oral Given 09/21/22 1300)    ED Course/ Medical Decision Making/ A&P                                 Medical Decision Making Amount and/or Complexity of Data Reviewed Labs: ordered.  Risk Prescription drug management.   Joseph Carr is a 52 y.o. male with comorbidities that complicate the patient evaluation including CAD status post PCI, hypertension, hyperlipidemia, and chronic alcohol use who presents emergency department with toe pain  Initial Ddx:  Gout, digital ischemia, fracture, cellulitis  MDM/Course:  Patient resents the emergency department with left third toe pain.  Is atraumatic.  Has already had x-rays that did not show evidence of fracture.  On exam has strong pulses in his foot.  Foot appears otherwise warm and well-perfused.  Does have good cap refill in that toe.  Had lab work drawn that showed elevated uric acid.  Suspect that he likely has gout and was given prednisone and colchicine x 2 with improvement of his symptoms.  With his history of CAD will hold off on giving him indomethacin.  Upon re-evaluation was feeling much better.  Instructed to follow-up with his primary doctor in several days regarding his symptoms.  Sent home with a prednisone taper as well.  This patient presents to the ED for concern of complaints listed in HPI, this involves an extensive number of treatment options, and is a complaint that carries with it a high risk of complications and morbidity. Disposition including potential need for admission considered.   Dispo: DC Home. Return precautions discussed including, but not limited to, those listed in the AVS. Allowed pt time to ask questions which were answered fully prior to dc.  Records reviewed Outpatient Clinic Notes The following labs were independently interpreted: Chemistry and show no acute abnormality I have reviewed the patients home medications and made adjustments as needed       Final  Clinical Impression(s) / ED Diagnoses Final diagnoses:  Acute gout involving toe of left foot, unspecified cause    Rx / DC Orders ED Discharge Orders          Ordered    predniSONE (DELTASONE) 10 MG tablet  Daily        09/21/22 1215              Rondel Baton, MD 09/21/22 1924

## 2022-09-21 NOTE — ED Triage Notes (Signed)
Patient arrives with continued L foot pain and swelling. Denies injury. States itching and pulling sensation. Went to UC for same on 8/12.

## 2022-09-21 NOTE — Discharge Instructions (Signed)
You were seen for your gout in the emergency department.   At home, please take Tylenol and a prednisone taper for your pain.  Please try to limit your alcohol intake since this can cause gout.  Check your MyChart online for the results of any tests that had not resulted by the time you left the emergency department.   Follow-up with your primary doctor in 2-3 days regarding your visit.  Please talk to them to see if you need to stop taking your chlorthalidone.  Return immediately to the emergency department if you experience any of the following: Fever, redness spreading up your foot, or any other concerning symptoms.    Thank you for visiting our Emergency Department. It was a pleasure taking care of you today.

## 2023-04-23 ENCOUNTER — Encounter: Payer: Self-pay | Admitting: Cardiology

## 2023-04-23 ENCOUNTER — Ambulatory Visit: Attending: Cardiology | Admitting: Cardiology

## 2023-04-23 ENCOUNTER — Other Ambulatory Visit: Payer: Self-pay

## 2023-04-23 VITALS — BP 174/102 | HR 78 | Resp 16 | Ht 69.0 in | Wt 154.4 lb

## 2023-04-23 DIAGNOSIS — I10 Essential (primary) hypertension: Secondary | ICD-10-CM

## 2023-04-23 DIAGNOSIS — I341 Nonrheumatic mitral (valve) prolapse: Secondary | ICD-10-CM

## 2023-04-23 DIAGNOSIS — I2510 Atherosclerotic heart disease of native coronary artery without angina pectoris: Secondary | ICD-10-CM

## 2023-04-23 DIAGNOSIS — E785 Hyperlipidemia, unspecified: Secondary | ICD-10-CM

## 2023-04-23 MED ORDER — EZETIMIBE 10 MG PO TABS
10.0000 mg | ORAL_TABLET | Freq: Every day | ORAL | 3 refills | Status: AC
  Filled 2023-08-03 – 2023-08-20 (×2): qty 90, 90d supply, fill #0
  Filled 2024-01-24: qty 30, 30d supply, fill #0
  Filled 2024-02-22: qty 30, 30d supply, fill #1

## 2023-04-23 MED ORDER — LOSARTAN POTASSIUM 100 MG PO TABS
100.0000 mg | ORAL_TABLET | Freq: Every day | ORAL | 3 refills | Status: AC
  Filled 2023-08-03 – 2024-01-01 (×3): qty 90, 90d supply, fill #0

## 2023-04-23 MED ORDER — AMLODIPINE BESYLATE 5 MG PO TABS
5.0000 mg | ORAL_TABLET | Freq: Every day | ORAL | 3 refills | Status: AC
  Filled 2023-08-03 – 2024-01-01 (×3): qty 90, 90d supply, fill #0

## 2023-04-23 MED ORDER — ATORVASTATIN CALCIUM 80 MG PO TABS
80.0000 mg | ORAL_TABLET | Freq: Every day | ORAL | 3 refills | Status: AC
  Filled 2023-08-03 – 2023-08-20 (×2): qty 90, 90d supply, fill #0
  Filled 2024-01-24: qty 30, 30d supply, fill #0

## 2023-04-23 MED ORDER — REPATHA SURECLICK 140 MG/ML ~~LOC~~ SOAJ: 1.0000 mL | SUBCUTANEOUS | 11 refills | Status: DC

## 2023-04-23 MED ORDER — CARVEDILOL 6.25 MG PO TABS
6.2500 mg | ORAL_TABLET | Freq: Two times a day (BID) | ORAL | 3 refills | Status: AC
  Filled 2023-08-03 – 2023-11-14 (×3): qty 180, 90d supply, fill #0

## 2023-04-23 NOTE — Progress Notes (Signed)
 Date:  04/23/2023   ID:  [name], DOB 1970-11-19, MRN [id number]   PCP:  Lorenda Ishihara, MD  Cardiologist:  Armanda Magic, MD  Electrophysiologist:  None   Evaluation Performed:  Follow-Up Visit  Chief Complaint:  CAD, HTN, HLD  History of Present Illness:    [name] is a 53 y.o. male with CAD (s/p NSTEMI 10/2013 treated with DES to RI, repeat cath for chest pain 2016 s/p DES to ramus-2 just distal to prior stent), HTN, HLD, possible hyperthyroidism, tobacco abuse, habitual alcohol intake, and intermittent noncompliance.  His last cath was in 06/2014 at time of second PCI. This otherwise showed 40% prox Cx, 15% prox LAD, normal LVF. His Brilinta was previously switched to Plavix after completing Twilight study and was he continued on ASA. 2D echo 06/2015 showed normal EF 55-60%, mild mitral valve prolapse (myxomatous changes). Nuclear stress test 12/13/18 showed diaphragmatic attenuation but no ischemia, EF 56%, low risk study.   He is here today for followup and is doing well.  He denies any exertional anginal chest pain or pressure, SOB, DOE, PND, orthopnea, LE edema, dizziness, palpitations or syncope. He occasionally will have a sharp pain in his chest if he lifts a very heavy item. He is compliant with his meds and is tolerating meds with no SE.      Past Medical History:  Diagnosis Date   At risk for noncompliance    CAD (coronary artery disease)    a. NSTEMI 10/2013 - DES to ramus intermedius.  b. 06/2014 Cath/PCI: LM nl, LAD 15p, RI patent stent prox, 51m (2.5x12 Promus Premier DES), LCX 40p, EF 55-65%.   Habitual alcohol use    Hyperlipidemia LDL goal <70    Hypertension    Hypertensive heart disease    mild LVH on MRI with no HOCM   Low TSH level    MVP (mitral valve prolapse)    Tobacco abuse    Past Surgical History:  Procedure Laterality Date   CARDIAC CATHETERIZATION N/A 07/03/2014   Procedure: Left Heart Cath and Coronary Angiography;  Surgeon:  Peter M Swaziland, MD;  Location: [facility] INVASIVE CV LAB;  Service: Cardiovascular;  Laterality: N/A;   CARDIAC CATHETERIZATION N/A 07/03/2014   Procedure: Coronary Stent Intervention;  Surgeon: Peter M Swaziland, MD;  Location: [facility] INVASIVE CV LAB;  Service: Cardiovascular;  Laterality: N/A;   CORONARY ANGIOPLASTY     FINGER FRACTURE SURGERY Left 2014   4th finger   LEFT HEART CATHETERIZATION WITH CORONARY ANGIOGRAM N/A 10/20/2013   Procedure: LEFT HEART CATHETERIZATION WITH CORONARY ANGIOGRAM;  Surgeon: Corky Crafts, MD;  Location: [facility] CATH LAB;  Service: Cardiovascular;  Laterality: N/A;   PERCUTANEOUS CORONARY STENT INTERVENTION (PCI-S)  07/03/2014   RAMUS       Current Meds  Medication Sig   amLODipine (NORVASC) 5 MG tablet Take 1 tablet (5 mg total) by mouth daily.   aspirin EC 81 MG tablet Take 1 tablet (81 mg total) by mouth daily.   atorvastatin (LIPITOR) 80 MG tablet TAKE 1 TABLET BY MOUTH ONCE DAILY AT 6PM.   carvedilol (COREG) 6.25 MG tablet TAKE 1 TABLET BY MOUTH TWICE DAILY WITH A MEAL   chlorthalidone (HYGROTON) 25 MG tablet Take 1 tablet by mouth once daily   doxycycline (VIBRAMYCIN) 100 MG capsule Take 1 capsule (100 mg total) by mouth 2 (two) times daily.   Evolocumab (REPATHA SURECLICK) 140 MG/ML SOAJ Inject 140 mg into the skin every 14 (fourteen) days.  ezetimibe (ZETIA) 10 MG tablet Take 1 tablet (10 mg total) by mouth daily.   losartan (COZAAR) 100 MG tablet TAKE 1 TABLET BY MOUTH ONCE DAILY, PT. WILL NEED TO MAKE AN APPOINTMENT IN ORDER TO RECEIVE FUTURE REFILLS. FIRST ATTEMPT.   nitroGLYCERIN (NITROSTAT) 0.4 MG SL tablet DISSOLVE ONE TABLET UNDER THE TONGUE EVERY 5 MINUTES AS NEEDED FOR CHEST PAIN.  DO NOT EXCEED A TOTAL OF 3 DOSES IN 15 MINUTES     Allergies:   Ace inhibitors   Social History   Tobacco Use   Smoking status: Light Smoker    Current packs/day: 0.00    Average packs/day: 0.8 packs/day for 30.0 years (22.5 ttl pk-yrs)    Types: Cigarettes    Start  date: 07/02/1984    Last attempt to quit: 07/03/2014    Years since quitting: 8.8   Smokeless tobacco: Never  Vaping Use   Vaping status: Never Used  Substance Use Topics   Alcohol use: Yes   Drug use: No     Family Hx: The patient's family history includes Heart attack in his father; Hypertension in his father; Stroke in his father. There is no history of Thyroid disease.  ROS:   Please see the history of present illness.    All other systems reviewed and are negative.   Prior CV studies:    Most recent pertinent cardiac studies are outlined above.  Labs/Other Tests and Data Reviewed:    EKG Interpretation Date/Time:  Monday April 23 2023 15:47:49 EDT Ventricular Rate:  87 PR Interval:  164 QRS Duration:  82 QT Interval:  358 QTC Calculation: 430 R Axis:   64  Text Interpretation: Normal sinus rhythm Minimal voltage criteria for LVH, may be normal variant ( Sokolow-Lyon ) Septal infarct , age undetermined When compared with ECG of 26-Apr-2020 10:14, Septal infarct is now Present Confirmed by Armanda Magic [phone]) on 04/23/2023 3:53:19 PM    Recent Labs: 09/21/2022: ALT 21; BUN 10; Creatinine, Ser 1.07; Potassium 3.6; Sodium 137   Recent Lipid Panel Lab Results  Component Value Date/Time   CHOL 188 12/23/2021 12:33 PM   TRIG 102 12/23/2021 12:33 PM   HDL 54 12/23/2021 12:33 PM   CHOLHDL 3.5 12/23/2021 12:33 PM   CHOLHDL 4.0 02/12/2015 12:57 PM   LDLCALC 116 (H) 12/23/2021 12:33 PM    Wt Readings from Last 3 Encounters:  04/23/23 154 lb 6.4 oz (70 kg)  09/18/22 148 lb (67.1 kg)  12/21/21 153 lb 6.4 oz (69.6 kg)     Objective:    Vital Signs:  BP (!) 174/102 (BP Location: Left Arm, Patient Position: Sitting, Cuff Size: Normal)   Pulse 78   Resp 16   Ht 5\' 9"  (1.753 m)   Wt 154 lb 6.4 oz (70 kg)   SpO2 98%   BMI 22.80 kg/m    GEN: Well nourished, well developed in no acute distress HEENT: Normal NECK: No JVD; No carotid bruits LYMPHATICS: No  lymphadenopathy CARDIAC:RRR, no murmurs, rubs, gallops RESPIRATORY:  Clear to auscultation without rales, wheezing or rhonchi  ABDOMEN: Soft, non-tender, non-distended MUSCULOSKELETAL:  No edema; No deformity  SKIN: Warm and dry NEUROLOGIC:  Alert and oriented x 3 PSYCHIATRIC:  Normal affect  ASSESSMENT & PLAN:    ASCAD with chronic atypical CP -s/p NSTEMI 10/2013 - DES to ramus intermedius. b.  -s/p Cath/PCI 2016: LM nl, LAD 15p, RI patent stent prox, 8m (2.5x12 Promus Premier DES), LCX 40p, EF 55-65%. -Nuclear stress test with  no ischemia 12/2018   -He has not had anginal symptoms since we saw him last -Continue prescription drug managed with aspirin 81 mg daily, carvedilol 6.25 mg twice daily, Repatha and Zetia 10 mg daily with as needed refills  Essential HTN  -BP poorly controlled on exam today but he has run out of his amlodipine, chlorthalidone and Losartan for a while -Continue prescription drug management with carvedilol 6.25 mg twice daily, and restart chlorthalidone 25 mg daily, Amlodipine 5mg  daily and losartan 100 mg daily with as needed refills -Pharm D OV in HTN clinic in 2 weeks  Hyperlipidemia -LDL goal < 70 -I have personally reviewed and interpreted outside labs performed by patient's PCP which showed LDL 72 and HDL 59 on 06/24/2021.   -Continue prescription drug therapy with atorvastatin 80 mg daily and Zetia 10mg  daily with as needed refills -Check FLP and ALT in 6 weeks  4.  Mitral valve prolapse  -2D echo 2021 showed EF 55 to 60% with grade 1 diastolic dysfunction and trivial MR with no frank mitral valve prolapse   Medication Adjustments/Labs and Tests Ordered: Current medicines are reviewed at length with the patient today.  Concerns regarding medicines are outlined above.   Disposition:  Follow up in 6 months with Extender and 1 year with me  Signed, Armanda Magic, MD  04/23/2023 3:57 PM    [facility]

## 2023-04-23 NOTE — Addendum Note (Signed)
 Addended by: Frutoso Schatz on: 04/23/2023 04:05 PM   Modules accepted: Orders

## 2023-04-23 NOTE — Patient Instructions (Signed)
 Medication Instructions:  Your physician recommends that you continue on your current medications as directed. Please refer to the Current Medication list given to you today.  *If you need a refill on your cardiac medications before your next appointment, please call your pharmacy*  Lab Work: IN 6 WEEKS: Fasting lipids and ALT - you can have this done at any [facility] location (see next page for list of locations).  Follow-Up: At [facility], you and your health needs are our priority.  As part of our continuing mission to provide you with exceptional heart care, we have created designated Provider Care Teams.  These Care Teams include your primary Cardiologist (physician) and Advanced Practice Providers (APPs -  Physician Assistants and Nurse Practitioners) who all work together to provide you with the care you need, when you need it.  Your next appointment:   2 weeks with PharmD in the Hypertension Clinic  6 months with an APP   1 year with Dr. Mayford Knife

## 2023-04-26 ENCOUNTER — Other Ambulatory Visit (HOSPITAL_COMMUNITY): Payer: Self-pay

## 2023-04-26 ENCOUNTER — Telehealth: Payer: Self-pay

## 2023-04-26 NOTE — Telephone Encounter (Signed)
 Pharmacy Patient Advocate Encounter   Received notification from CoverMyMeds that prior authorization for REPATHA is required/requested.   Insurance verification completed.   The patient is insured through [facility] .   Per test claim: PA required; PA submitted to above mentioned insurance via CoverMyMeds Key/confirmation #/EOC [id number] Status is pending

## 2023-04-26 NOTE — Telephone Encounter (Signed)
 Pharmacy Patient Advocate Encounter  Received notification from [facility] that Prior Authorization for REPATHA has been APPROVED from 04/26/23 to 04/25/24. Ran test claim, Copay is $60. This test claim was processed through [facility]- copay amounts may vary at other pharmacies due to pharmacy/plan contracts, or as the patient moves through the different stages of their insurance plan.

## 2023-06-21 ENCOUNTER — Other Ambulatory Visit (HOSPITAL_COMMUNITY): Payer: Self-pay

## 2023-06-21 ENCOUNTER — Ambulatory Visit: Attending: Cardiovascular Disease | Admitting: Pharmacist

## 2023-06-21 VITALS — BP 120/80 | HR 81

## 2023-06-21 DIAGNOSIS — I10 Essential (primary) hypertension: Secondary | ICD-10-CM

## 2023-06-21 DIAGNOSIS — E785 Hyperlipidemia, unspecified: Secondary | ICD-10-CM

## 2023-06-21 MED ORDER — REPATHA SURECLICK 140 MG/ML ~~LOC~~ SOAJ
1.0000 mL | SUBCUTANEOUS | 11 refills | Status: DC
  Filled 2023-06-21: qty 2, 28d supply, fill #0
  Filled 2023-07-18: qty 2, 28d supply, fill #1
  Filled 2023-08-03 – 2023-08-31 (×3): qty 2, 28d supply, fill #2

## 2023-06-21 NOTE — Assessment & Plan Note (Signed)
 Assessment: Patient currently on atorvastatin  80 and ezetimibe  Is out of Repatha  No issues with injections, his prescription just expired and he did not realize it We will send Rx to pharmacy downstairs for patient to pick up today Was able to get him a new co-pay card and gave that information to the [facility] pharmacy Will need repeat labs  Plan: Resume Repatha  140 mg every 2 weeks Continue atorvastatin  80 mg daily and ezetimibe  10 mg daily Repeat labs in 2 to 3 months

## 2023-06-21 NOTE — Progress Notes (Signed)
 Patient ID: Joseph Carr                 DOB: [birth year]                    MRN: [id number]      HPI: Joseph Carr is a 53 y.o. male patient referred to lipid clinic by Dr. Micael Adas. PMH is significant for CAD (s/p NSTEMI 10/2013 treated with DES to RI, repeat cath for chest pain 2016 s/p DES to ramus-2 just distal to prior stent), HTN, HLD, possible hyperthyroidism, tobacco abuse, habitual alcohol intake, and intermittent noncompliance.   I saw patient back in Jan 2024 for lipids and blood pressure. Amlodipine  5mg  daily was added to his medication regimen and he was started on Repatha . He saw Dr. Micael Adas 04/23/23. BP was uncontrolled. Reported being out of his Bp medications for awhile.  Refills were all his medications were sent to the pharmacy.  Patient presents today for follow-up.  Reports he feels much better now that he is back on all his blood pressure medicines.  Does report that he has not been on Repatha  because his prescription expired and he did not realize it.  Denies any dizziness or lightheadedness.  He has not checked his blood pressure in a few weeks but states that the last time he checked it it was 120 over something.   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, up and down the fork lift  Family History:  Family History  Problem Relation Age of Onset   Heart attack Father    Hypertension Father    Stroke Father    Thyroid  disease Neg Hx     Social History: 1 pack per 2  weeks, 1 16oz beer (light beer)  Labs: Lipid Panel     Component Value Date/Time   CHOL 188 12/23/2021 1233   TRIG 102 12/23/2021 1233   HDL 54 12/23/2021 1233   CHOLHDL 3.5 12/23/2021 1233   CHOLHDL 4.0 02/12/2015 1257   VLDL 18 02/12/2015 1257   LDLCALC 116 (H) 12/23/2021 1233   LABVLDL 18 12/23/2021 1233    Past Medical History:  Diagnosis Date   At risk for noncompliance    CAD (coronary artery disease)    a. NSTEMI 10/2013 - DES to ramus intermedius.  b. 06/2014 Cath/PCI: LM nl, LAD 15p, RI patent stent prox, 47m (2.5x12 Promus Premier DES), LCX 40p, EF 55-65%.   Habitual alcohol use    Hyperlipidemia LDL goal <70    Hypertension    Hypertensive heart disease    mild LVH on MRI with no HOCM   Low TSH level    MVP (mitral valve prolapse)    Tobacco abuse     Current Outpatient Medications on File Prior to Visit  Medication Sig Dispense Refill   amLODipine  (NORVASC ) 5 MG tablet Take 1 tablet (5 mg total) by mouth daily. 90 tablet 3   aspirin  EC 81 MG tablet Take 1 tablet (81 mg total) by mouth daily.     atorvastatin  (LIPITOR ) 80 MG tablet  TAKE 1 TABLET BY MOUTH ONCE DAILY AT 6PM. 90 tablet 3   carvedilol  (COREG ) 6.25 MG tablet Take 1 tablet (6.25 mg total) by mouth 2 (two) times daily with a meal. 180 tablet 3   chlorthalidone  (HYGROTON ) 25 MG tablet Take 1 tablet by mouth once daily 90 tablet 1   ezetimibe  (ZETIA ) 10 MG tablet Take 1 tablet (10 mg total) by mouth daily. 90 tablet 3   losartan  (COZAAR ) 100 MG tablet Take 1 tablet (100 mg total) by mouth daily. 90 tablet 3   nitroGLYCERIN  (NITROSTAT ) 0.4 MG SL tablet DISSOLVE ONE TABLET UNDER THE TONGUE EVERY 5 MINUTES AS NEEDED FOR CHEST PAIN.  DO NOT EXCEED A TOTAL OF 3 DOSES IN 15 MINUTES 25 tablet 3   No current facility-administered medications on file prior to visit.    Allergies  Allergen Reactions   Ace Inhibitors Swelling    Lip swelling that resolved with benadryl , did not compromise his breathing     Assessment/Plan:  1. Hyperlipidemia -  Essential hypertension Assessment: Blood pressure significantly improved in clinic today now at goal less than 130/80 Patient reports compliance with his medications Has an active job and also reports going for walks About 1-1/2 cups of coffee per day  Plan:  Continue losartan  100 mg daily, chlorthalidone  25 mg daily and amlodipine  5 mg daily Follow-up as needed Advised patient to contact me if he ever has issues getting his medications   Hyperlipidemia LDL goal <70 Assessment: Patient currently on atorvastatin  80 and ezetimibe  Is out of Repatha  No issues with injections, his prescription just expired and he did not realize it We will send Rx to pharmacy downstairs for patient to pick up today Was able to get him a new co-pay card and gave that information to the [facility] pharmacy Will need repeat labs  Plan: Resume Repatha  140 mg every 2 weeks Continue atorvastatin  80 mg daily and ezetimibe  10 mg daily Repeat labs in 2 to 3 months   Thank you,  Aum Caggiano D Adain Geurin, Pharm.Monika Annas, CPP [facility] [address] [place], [place] [address]  Phone: [phone]; Fax: [phone]

## 2023-06-21 NOTE — Patient Instructions (Addendum)
 Please resume Repatha . Here is your copay card information for your records. I have sent the prescription to the pharmacy on the 1st floor   RxBin: [id number] RxPCN: [id number] RxGrp: [id number] ID: [id number]  Continue atorvastatin  and ezetimibe    We will need lab work in 2 months- please come to the first floor of [address] for Fasting labs  Please continue taking losartan  100 mg daily, chlorthalidone  25 mg daily, amlodipine  5mg  daily

## 2023-06-21 NOTE — Assessment & Plan Note (Signed)
 Assessment: Blood pressure significantly improved in clinic today now at goal less than 130/80 Patient reports compliance with his medications Has an active job and also reports going for walks About 1-1/2 cups of coffee per day  Plan:  Continue losartan  100 mg daily, chlorthalidone  25 mg daily and amlodipine  5 mg daily Follow-up as needed Advised patient to contact me if he ever has issues getting his medications

## 2023-07-20 ENCOUNTER — Other Ambulatory Visit (HOSPITAL_COMMUNITY): Payer: Self-pay

## 2023-08-03 ENCOUNTER — Other Ambulatory Visit (HOSPITAL_COMMUNITY): Payer: Self-pay

## 2023-08-03 MED ORDER — REPATHA SURECLICK 140 MG/ML ~~LOC~~ SOAJ
140.0000 mg | SUBCUTANEOUS | 12 refills | Status: DC
  Filled 2023-08-03: qty 2, 28d supply, fill #0

## 2023-08-06 ENCOUNTER — Other Ambulatory Visit (HOSPITAL_COMMUNITY): Payer: Self-pay

## 2023-08-20 ENCOUNTER — Other Ambulatory Visit (HOSPITAL_COMMUNITY): Payer: Self-pay

## 2023-08-30 ENCOUNTER — Other Ambulatory Visit (HOSPITAL_COMMUNITY): Payer: Self-pay

## 2023-08-31 ENCOUNTER — Ambulatory Visit
Admission: EM | Admit: 2023-08-31 | Discharge: 2023-08-31 | Disposition: A | Discharge status: Home / Self Care | Attending: Emergency Medicine | Admitting: Emergency Medicine

## 2023-08-31 ENCOUNTER — Other Ambulatory Visit (HOSPITAL_COMMUNITY): Payer: Self-pay

## 2023-08-31 ENCOUNTER — Encounter: Payer: Self-pay | Admitting: Emergency Medicine

## 2023-08-31 DIAGNOSIS — M25512 Pain in left shoulder: Secondary | ICD-10-CM | POA: Diagnosis not present

## 2023-08-31 DIAGNOSIS — S161XXA Strain of muscle, fascia and tendon at neck level, initial encounter: Secondary | ICD-10-CM

## 2023-08-31 MED ORDER — METHOCARBAMOL 500 MG PO TABS: 500.0000 mg | ORAL_TABLET | Freq: Two times a day (BID) | ORAL | 0 refills | Status: AC

## 2023-08-31 NOTE — ED Triage Notes (Signed)
 Pt reports L shoulder pain x3 weeks. Denies injury. Pt reports constant pain sitting in L shoulder that radiates to middle of his forearm and up his neck to posterior head. Denies headaches. Notes pain is worse at night and will wake him up with stabbing pain. Pt reports hx of heart attacks and states this feels nothing like that. Denies heart palpitations, chest pain, or SOB.

## 2023-08-31 NOTE — ED Provider Notes (Signed)
 [facility]    CSN: [id number] Arrival date & time: 08/31/23  1331      History   Chief Complaint Chief Complaint  Patient presents with   Shoulder Pain    HPI Joseph Carr is a 53 y.o. male.   Patient presents to clinic over concern of left neck and shoulder pain that has been ongoing for the past 3 weeks.  He works a very physically active and demanding job.  Notices that range of motion will trigger his neck and shoulder pain.  Pain seems to be worse at night will wake him up, has difficulty sleeping and finding a comfortable position.  Has been having trouble lifting heavy things above his head, as he feels like his shoulder will have a stabbing pain when he goes to perform his activities.  Does have a history of STEMI and a cardiac cath in 2016.  Has not had any chest pain or shortness of breath.  Has been doing heat and massage as well as over-the-counter medications which will help temporarily but then the pain returns.  The history is provided by the patient and medical records.  Shoulder Pain   Past Medical History:  Diagnosis Date   At risk for noncompliance    CAD (coronary artery disease)    a. NSTEMI 10/2013 - DES to ramus intermedius.  b. 06/2014 Cath/PCI: LM nl, LAD 15p, RI patent stent prox, 27m (2.5x12 Promus Premier DES), LCX 40p, EF 55-65%.   Habitual alcohol use    Hyperlipidemia LDL goal <70    Hypertension    Hypertensive heart disease    mild LVH on MRI with no HOCM   Low TSH level    MVP (mitral valve prolapse)    Tobacco abuse     Patient Active Problem List   Diagnosis Date Noted   Hypertensive heart disease    Pain in left knee 03/07/2019   Hyperthyroidism 02/28/2019   Tobacco abuse 10/30/2013   CAD (coronary artery disease) 10/21/2013    Class: Acute   Presence of drug coated stent in Circumflex-Ramus Intermedius branch: Promus DES 2.5 mm x 20 mm (2.75 mm) placed 10/20/13 10/21/2013   Essential hypertension     Class:  Chronic   Hyperlipidemia LDL goal <70     Past Surgical History:  Procedure Laterality Date   CARDIAC CATHETERIZATION N/A 07/03/2014   Procedure: Left Heart Cath and Coronary Angiography;  Surgeon: Peter M Swaziland, MD;  Location[facility] INVASIVE CV LAB;  Service: Cardiovascular;  Laterality: N/A;   CARDIAC CATHETERIZATION N/A 07/03/2014   Procedure: Coronary Stent Intervention;  Surgeon: Peter M Swaziland, MD;  Location: [facility] INVASIVE CV LAB;  Service: Cardiovascular;  Laterality: N/A;   CORONARY ANGIOPLASTY     FINGER FRACTURE SURGERY Left 2014   4th finger   LEFT HEART CATHETERIZATION WITH CORONARY ANGIOGRAM N/A 10/20/2013   Procedure: LEFT HEART CATHETERIZATION WITH CORONARY ANGIOGRAM;  Surgeon: Candyce GORMAN Reek, MD;  Location: [facility] CATH LAB;  Service: Cardiovascular;  Laterality: N/A;   PERCUTANEOUS CORONARY STENT INTERVENTION (PCI-S)  07/03/2014   RAMUS         Home Medications    Prior to Admission medications   Medication Sig Start Date End Date Taking? Authorizing Provider  amLODipine  (NORVASC ) 5 MG tablet Take 1 tablet (5 mg total) by mouth daily. 04/23/23  Yes Turner, Wilbert SAUNDERS, MD  aspirin  EC 81 MG tablet Take 1 tablet (81 mg total) by mouth daily. 10/26/15  Yes Turner, Wilbert SAUNDERS,  MD  atorvastatin  (LIPITOR ) 80 MG tablet Take 1 tablet (80 mg total) by mouth daily at 6 PM. 04/23/23  Yes Turner, Wilbert SAUNDERS, MD  carvedilol  (COREG ) 6.25 MG tablet Take 1 tablet (6.25 mg total) by mouth 2 (two) times daily with a meal. 04/23/23  Yes Turner, Wilbert SAUNDERS, MD  chlorthalidone  (HYGROTON ) 25 MG tablet Take 1 tablet by mouth once daily 07/12/22  Yes Swinyer, Rosaline HERO, NP  Evolocumab  (REPATHA  SURECLICK) 140 MG/ML SOAJ Inject 140 mg into the skin every 14 (fourteen) days. 06/21/23  Yes Turner, Wilbert SAUNDERS, MD  ezetimibe  (ZETIA ) 10 MG tablet Take 1 tablet (10 mg total) by mouth daily. 04/23/23  Yes Turner, Wilbert SAUNDERS, MD  losartan  (COZAAR ) 100 MG tablet Take 1 tablet (100 mg total) by mouth daily. 04/23/23  Yes Turner, Wilbert SAUNDERS, MD  methocarbamol (ROBAXIN) 500 MG tablet Take 1 tablet (500 mg total) by mouth 2 (two) times daily. 08/31/23  Yes Orabelle Rylee  N, FNP  Evolocumab  (REPATHA  SURECLICK) 140 MG/ML SOAJ Inject 140 mg into the skin every 14 (fourteen) days. 04/23/23   Shlomo Wilbert SAUNDERS, MD  nitroGLYCERIN  (NITROSTAT ) 0.4 MG SL tablet DISSOLVE ONE TABLET UNDER THE TONGUE EVERY 5 MINUTES AS NEEDED FOR CHEST PAIN.  DO NOT EXCEED A TOTAL OF 3 DOSES IN 15 MINUTES 06/24/21   Swinyer, Rosaline HERO, NP    Family History Family History  Problem Relation Age of Onset   Heart attack Father    Hypertension Father    Stroke Father    Thyroid  disease Neg Hx     Social History Social History   Tobacco Use   Smoking status: Light Smoker    Current packs/day: 0.00    Average packs/day: 0.8 packs/day for 30.0 years (22.5 ttl pk-yrs)    Types: Cigarettes    Start date: 07/02/1984    Last attempt to quit: 07/03/2014    Years since quitting: 9.1   Smokeless tobacco: Never  Vaping Use   Vaping status: Never Used  Substance Use Topics   Alcohol use: Yes   Drug use: No     Allergies   Ace inhibitors   Review of Systems Review of Systems  Per HPI  Physical Exam Triage Vital Signs ED Triage Vitals [08/31/23 1405]  Encounter Vitals Group     BP (!) 155/94     Girls Systolic BP Percentile      Girls Diastolic BP Percentile      Boys Systolic BP Percentile      Boys Diastolic BP Percentile      Pulse Rate 85     Resp 18     Temp 98 F (36.7 C)     Temp Source Oral     SpO2 96 %     Weight      Height      Head Circumference      Peak Flow      Pain Score 5     Pain Loc      Pain Education      Exclude from Growth Chart    No data found.  Updated Vital Signs BP (!) 155/94 (BP Location: Left Arm) Comment: has not taken BP meds today  Pulse 85   Temp 98 F (36.7 C) (Oral)   Resp 18   SpO2 96%   Visual Acuity Right Eye Distance:   Left Eye Distance:   Bilateral Distance:    Right Eye Near:    Left Eye Near:  Bilateral Near:     Physical Exam Vitals and nursing note reviewed.  Constitutional:      Appearance: Normal appearance.  HENT:     Head: Normocephalic and atraumatic.     Right Ear: External ear normal.     Left Ear: External ear normal.     Nose: Nose normal.     Mouth/Throat:     Mouth: Mucous membranes are moist.  Eyes:     Conjunctiva/sclera: Conjunctivae normal.  Neck:      Comments: Muscular neck pain is reproducible to palpation and with ROM. Without spinous process tenderness, atraumatic  Cardiovascular:     Rate and Rhythm: Normal rate.  Pulmonary:     Effort: Pulmonary effort is normal. No respiratory distress.  Musculoskeletal:     Left shoulder: Decreased range of motion. Normal strength. Normal pulse.     Cervical back: Muscular tenderness present. No spinous process tenderness.     Comments: Strength 5 out of 5 in bilateral upper extremities.  Difficulty extending the shoulder beyond 90 degrees due to pain.  Radial pulse is brisk and 2+.  Skin:    General: Skin is warm and dry.  Neurological:     General: No focal deficit present.     Mental Status: He is alert and oriented to person, place, and time.  Psychiatric:        Mood and Affect: Mood normal.        Behavior: Behavior normal. Behavior is cooperative.      UC Treatments / Results  Labs (all labs ordered are listed, but only abnormal results are displayed) Labs Reviewed - No data to display  EKG   Radiology No results found.  Procedures Procedures (including critical care time)  Medications Ordered in UC Medications - No data to display  Initial Impression / Assessment and Plan / UC Course  I have reviewed the triage vital signs and the nursing notes.  Pertinent labs & imaging results that were available during my care of the patient were reviewed by me and considered in my medical decision making (see chart for details).  Vitals and triage reviewed, patient is  hemodynamically stable.  Atraumatic neck pain, without cervical spine step-off, tenderness or deformity.  Atraumatic.  Imaging deferred.  Muscular pain is reproducible with palpation and range of motion, pain extends into the shoulder area.  Previous cardiac history.  EKG in clinic shows normal sinus rhythm, without ST elevation or ST depression, low concern for cardiac etiology at this time.  Will treat symptomatically for soft tissue injuries/muscular neck spasm.  Plan of care, follow-up care return precautions given, no questions at this time.  Sports med follow-up encouraged.     Final Clinical Impressions(s) / UC Diagnoses   Final diagnoses:  Strain of neck muscle, initial encounter  Left shoulder pain, unspecified chronicity     Discharge Instructions      Your physical exam was reassuring.  Your EKG did not show any evidence of new heart damage.  Your pain appears to be muscular.  Continue massage, gentle compress and stretching.  Take the muscle relaxer up to 2 times daily to help loosen the muscle, do not drink alcohol or drive on this medication as may cause drowsiness or sedation.  Modify your activities and rest over the next few days.  If symptoms persist despite trying the muscle relaxer over the next week then please follow-up with sports medicine for further advanced evaluation.    ED Prescriptions  Medication Sig Dispense Auth. Provider   methocarbamol (ROBAXIN) 500 MG tablet Take 1 tablet (500 mg total) by mouth 2 (two) times daily. 20 tablet Dreama, Alonni Heimsoth  N, FNP      PDMP not reviewed this encounter.   Dreama, Ranier Coach  N, FNP 08/31/23 1443

## 2023-08-31 NOTE — Discharge Instructions (Addendum)
 Your physical exam was reassuring.  Your EKG did not show any evidence of new heart damage.  Your pain appears to be muscular.  Continue massage, gentle compress and stretching.  Take the muscle relaxer up to 2 times daily to help loosen the muscle, do not drink alcohol or drive on this medication as may cause drowsiness or sedation.  Modify your activities and rest over the next few days.  If symptoms persist despite trying the muscle relaxer over the next week then please follow-up with sports medicine for further advanced evaluation.

## 2023-09-11 ENCOUNTER — Other Ambulatory Visit (HOSPITAL_COMMUNITY): Payer: Self-pay

## 2023-10-12 ENCOUNTER — Telehealth: Payer: Self-pay | Admitting: Pharmacist

## 2023-10-12 MED ORDER — REPATHA SURECLICK 140 MG/ML ~~LOC~~ SOAJ: 140.0000 mg | SUBCUTANEOUS | 3 refills | Status: AC

## 2023-10-12 MED ORDER — REPATHA SURECLICK 140 MG/ML ~~LOC~~ SOAJ: 1.0000 mL | SUBCUTANEOUS | 3 refills | Status: DC

## 2023-10-12 NOTE — Telephone Encounter (Signed)
 Called the [facility] for repeat blood labs.  Patient states he has been off of Repatha  for about a month.  I will call in another refill for him and he will resume.  Will contact patient in 2 months to set up repeat labs.

## 2023-10-19 ENCOUNTER — Encounter: Payer: Self-pay | Admitting: Cardiology

## 2023-11-05 ENCOUNTER — Encounter: Payer: Self-pay | Admitting: Emergency Medicine

## 2023-11-05 ENCOUNTER — Ambulatory Visit
Admission: EM | Admit: 2023-11-05 | Discharge: 2023-11-05 | Disposition: A | Discharge status: Home / Self Care | Attending: Nurse Practitioner | Admitting: Nurse Practitioner

## 2023-11-05 DIAGNOSIS — I160 Hypertensive urgency: Secondary | ICD-10-CM

## 2023-11-05 DIAGNOSIS — M109 Gout, unspecified: Secondary | ICD-10-CM | POA: Diagnosis not present

## 2023-11-05 MED ORDER — NAPROXEN 500 MG PO TABS: 500.0000 mg | ORAL_TABLET | Freq: Two times a day (BID) | ORAL | 0 refills | Status: AC

## 2023-11-05 MED ORDER — DEXAMETHASONE SODIUM PHOSPHATE 10 MG/ML IJ SOLN
10.0000 mg | Freq: Once | INTRAMUSCULAR | Status: AC
  Administered 2023-11-05: 10 mg via INTRAMUSCULAR

## 2023-11-05 MED ORDER — PREDNISONE 20 MG PO TABS
40.0000 mg | ORAL_TABLET | Freq: Every day | ORAL | 0 refills | Status: AC
Start: 2023-11-05 — End: 2023-11-10

## 2023-11-05 MED ORDER — CLONIDINE HCL 0.2 MG PO TABS
0.2000 mg | ORAL_TABLET | Freq: Once | ORAL | Status: AC
  Administered 2023-11-05: 0.2 mg via ORAL

## 2023-11-05 MED ORDER — KETOROLAC TROMETHAMINE 60 MG/2ML IM SOLN
60.0000 mg | Freq: Once | INTRAMUSCULAR | Status: AC
  Administered 2023-11-05: 60 mg via INTRAMUSCULAR

## 2023-11-05 NOTE — Discharge Instructions (Addendum)
 You were seen today for pain in your left foot due to a gout flare as well as very high blood pressure. Your symptoms are consistent with an acute gout attack. In the clinic you received injections of Toradol  and Decadron to help with pain and inflammation. You were also prescribed Prednisone  and Naproxen  and should begin these tomorrow morning as directed. Continue to rest, elevate the foot when possible, and use ice packs to reduce pain and swelling. Your blood pressure today was 204/105, which is dangerously high. You reported not taking your blood pressure medications for the past three days. In the clinic you were given Clonidine to help lower your blood pressure. It is very important that you restart your prescribed blood pressure medicine every day as directed, as untreated high blood pressure places you at risk for stroke, heart attack, kidney damage, and other serious complications. At home, take your medications exactly as prescribed. Drink plenty of fluids unless your doctor has advised otherwise. Try to avoid foods that can trigger gout attacks, such as red meats, organ meats, shellfish, and alcohol. You should schedule an appointment with your primary care provider as soon as possible to review your blood pressure and your gout management. Seek emergency care immediately if you develop chest pain, shortness of breath, sudden severe headache, vision changes, weakness, numbness, difficulty speaking or walking, or if your foot pain, redness, or swelling become severe or rapidly worsen.

## 2023-11-05 NOTE — ED Triage Notes (Signed)
 PT st's he thinks he Is having a gout flare up  Pt c/o pain and swelling to left foot x's 3 days

## 2023-11-05 NOTE — ED Provider Notes (Signed)
 [facility]    CSN: [id number] Arrival date & time: 11/05/23  1833      History   Chief Complaint Chief Complaint  Patient presents with   Foot Pain    HPI Joseph Carr is a 53 y.o. male.   Discussed the use of AI scribe software for clinical note transcription with the patient, who gave verbal consent to proceed.   The patient, with a known history of gout and hypertension, presents with left foot pain consistent with a gout flare. Symptoms began two days ago and have persisted. The patient denies numbness or tingling, reporting only localized pain. He attempted to manage the discomfort by soaking the foot in Epsom salt and by massage performed by his wife, which provided only brief relief.  The patient also reports non-adherence to his antihypertensive medication, stating he has not taken it for at least the past three days. He denies headache, dizziness, chest pain, shortness of breath, nausea, vomiting, or blurred vision.  The following portions of the patient's history were reviewed and updated as appropriate: allergies, current medications, past family history, past medical history, past social history, past surgical history, and problem list.    Past Medical History:  Diagnosis Date   At risk for noncompliance    CAD (coronary artery disease)    a. NSTEMI 10/2013 - DES to ramus intermedius.  b. 06/2014 Cath/PCI: LM nl, LAD 15p, RI patent stent prox, 80m (2.5x12 Promus Premier DES), LCX 40p, EF 55-65%.   Habitual alcohol use    Hyperlipidemia LDL goal <70    Hypertension    Hypertensive heart disease    mild LVH on MRI with no HOCM   Low TSH level    MVP (mitral valve prolapse)    Tobacco abuse     Patient Active Problem List   Diagnosis Date Noted   Hypertensive heart disease    Pain in left knee 03/07/2019   Hyperthyroidism 02/28/2019   Tobacco abuse 10/30/2013   CAD (coronary artery disease) 10/21/2013    Class: Acute   Presence of drug  coated stent in Circumflex-Ramus Intermedius branch: Promus DES 2.5 mm x 20 mm (2.75 mm) placed 10/20/13 10/21/2013   Essential hypertension     Class: Chronic   Hyperlipidemia LDL goal <70     Past Surgical History:  Procedure Laterality Date   CARDIAC CATHETERIZATION N/A 07/03/2014   Procedure: Left Heart Cath and Coronary Angiography;  Surgeon: Peter M Swaziland, MD;  Location: [facility] INVASIVE CV LAB;  Service: Cardiovascular;  Laterality: N/A;   CARDIAC CATHETERIZATION N/A 07/03/2014   Procedure: Coronary Stent Intervention;  Surgeon: Peter M Swaziland, MD;  Location: [facility] INVASIVE CV LAB;  Service: Cardiovascular;  Laterality: N/A;   CORONARY ANGIOPLASTY     FINGER FRACTURE SURGERY Left 2014   4th finger   LEFT HEART CATHETERIZATION WITH CORONARY ANGIOGRAM N/A 10/20/2013   Procedure: LEFT HEART CATHETERIZATION WITH CORONARY ANGIOGRAM;  Surgeon: Candyce GORMAN Reek, MD;  Location: [facility] CATH LAB;  Service: Cardiovascular;  Laterality: N/A;   PERCUTANEOUS CORONARY STENT INTERVENTION (PCI-S)  07/03/2014   RAMUS         Home Medications    Prior to Admission medications   Medication Sig Start Date End Date Taking? Authorizing Provider  naproxen  (NAPROSYN ) 500 MG tablet Take 1 tablet (500 mg total) by mouth 2 (two) times daily with a meal. Take with food to avoid stomach upset. Do not take any additional NSAIDs while on this. You may take  tylenol  in addition to this if needed for extra pain relief. 11/05/23  Yes Iola Lukes, FNP  predniSONE  (DELTASONE ) 20 MG tablet Take 2 tablets (40 mg total) by mouth daily for 5 days. 11/05/23 11/10/23 Yes Iola Lukes, FNP  amLODipine  (NORVASC ) 5 MG tablet Take 1 tablet (5 mg total) by mouth daily. 04/23/23   Shlomo Wilbert SAUNDERS, MD  aspirin  EC 81 MG tablet Take 1 tablet (81 mg total) by mouth daily. 10/26/15   Shlomo Wilbert SAUNDERS, MD  atorvastatin  (LIPITOR ) 80 MG tablet Take 1 tablet (80 mg total) by mouth daily at 6 PM. 04/23/23   Turner, Wilbert SAUNDERS, MD  carvedilol   (COREG ) 6.25 MG tablet Take 1 tablet (6.25 mg total) by mouth 2 (two) times daily with a meal. 04/23/23   Shlomo Wilbert SAUNDERS, MD  chlorthalidone  (HYGROTON ) 25 MG tablet Take 1 tablet by mouth once daily 07/12/22   Swinyer, Rosaline HERO, NP  Evolocumab  (REPATHA  SURECLICK) 140 MG/ML SOAJ Inject 140 mg into the skin every 14 (fourteen) days. 10/12/23   Shlomo Wilbert SAUNDERS, MD  ezetimibe  (ZETIA ) 10 MG tablet Take 1 tablet (10 mg total) by mouth daily. 04/23/23   Shlomo Wilbert SAUNDERS, MD  losartan  (COZAAR ) 100 MG tablet Take 1 tablet (100 mg total) by mouth daily. 04/23/23   Shlomo Wilbert SAUNDERS, MD  methocarbamol  (ROBAXIN ) 500 MG tablet Take 1 tablet (500 mg total) by mouth 2 (two) times daily. Patient not taking: Reported on 11/05/2023 08/31/23   Dreama, [place]  N, FNP  nitroGLYCERIN  (NITROSTAT ) 0.4 MG SL tablet DISSOLVE ONE TABLET UNDER THE TONGUE EVERY 5 MINUTES AS NEEDED FOR CHEST PAIN.  DO NOT EXCEED A TOTAL OF 3 DOSES IN 15 MINUTES 06/24/21   Swinyer, Rosaline HERO, NP    Family History Family History  Problem Relation Age of Onset   Heart attack Father    Hypertension Father    Stroke Father    Thyroid  disease Neg Hx     Social History Social History   Tobacco Use   Smoking status: Light Smoker    Current packs/day: 0.00    Average packs/day: 0.8 packs/day for 30.0 years (22.5 ttl pk-yrs)    Types: Cigarettes    Start date: 07/02/1984    Last attempt to quit: 07/03/2014    Years since quitting: 9.3   Smokeless tobacco: Never  Vaping Use   Vaping status: Never Used  Substance Use Topics   Alcohol use: Yes   Drug use: No     Allergies   Ace inhibitors   Review of Systems Review of Systems  Eyes:  Negative for visual disturbance.  Respiratory:  Negative for shortness of breath.   Cardiovascular:  Negative for chest pain, palpitations and leg swelling.  Gastrointestinal:  Negative for nausea and vomiting.  Musculoskeletal:  Positive for arthralgias and joint swelling.  Neurological:  Negative for  dizziness, weakness, numbness and headaches.  All other systems reviewed and are negative.    Physical Exam Triage Vital Signs ED Triage Vitals  Encounter Vitals Group     BP 11/05/23 1843 (!) 202/103     Girls Systolic BP Percentile --      Girls Diastolic BP Percentile --      Boys Systolic BP Percentile --      Boys Diastolic BP Percentile --      Pulse Rate 11/05/23 1843 100     Resp 11/05/23 1843 18     Temp 11/05/23 1843 98.4 F (36.9 C)  Temp Source 11/05/23 1843 Oral     SpO2 11/05/23 1843 97 %     Weight --      Height --      Head Circumference --      Peak Flow --      Pain Score 11/05/23 1847 10     Pain Loc --      Pain Education --      Exclude from Growth Chart --    No data found.  Updated Vital Signs BP (!) 204/105 (BP Location: Left Arm)   Pulse 95   Temp 98.1 F (36.7 C) (Oral)   Resp 18   SpO2 96%   Visual Acuity Right Eye Distance:   Left Eye Distance:   Bilateral Distance:    Right Eye Near:   Left Eye Near:    Bilateral Near:     Physical Exam Vitals reviewed.  Constitutional:      General: He is awake. He is not in acute distress.    Appearance: Normal appearance. He is well-developed. He is not ill-appearing, toxic-appearing or diaphoretic.  HENT:     Head: Normocephalic.     Right Ear: Hearing normal.     Left Ear: Hearing normal.     Nose: Nose normal.     Mouth/Throat:     Mouth: Mucous membranes are moist.  Eyes:     General: Vision grossly intact.     Conjunctiva/sclera: Conjunctivae normal.  Cardiovascular:     Rate and Rhythm: Normal rate and regular rhythm.     Heart sounds: Normal heart sounds.  Pulmonary:     Effort: Pulmonary effort is normal.     Breath sounds: Normal breath sounds and air entry.  Musculoskeletal:        General: Normal range of motion.     Cervical back: Normal range of motion and neck supple.     Left foot: Normal range of motion. Swelling and tenderness present. No deformity.        Feet:     Comments: Pain and swelling are present in the left great toe. No erythema or warmth is observed. Pedal pulses are normal. Range of motion is limited due to pain, but strength and sensation remain intact. Neurovascular status is preserved.  Skin:    General: Skin is warm and dry.  Neurological:     General: No focal deficit present.     Mental Status: He is alert and oriented to person, place, and time.  Psychiatric:        Speech: Speech normal.        Behavior: Behavior is cooperative.      UC Treatments / Results  Labs (all labs ordered are listed, but only abnormal results are displayed) Labs Reviewed - No data to display  EKG   Radiology No results found.  Procedures Procedures (including critical care time)  Medications Ordered in UC Medications  dexamethasone (DECADRON) injection 10 mg (10 mg Intramuscular Given 11/05/23 2009)  ketorolac  (TORADOL ) injection 60 mg (60 mg Intramuscular Given 11/05/23 2011)  cloNIDine (CATAPRES) tablet 0.2 mg (0.2 mg Oral Given 11/05/23 2008)    Initial Impression / Assessment and Plan / UC Course  I have reviewed the triage vital signs and the nursing notes.  Pertinent labs & imaging results that were available during my care of the patient were reviewed by me and considered in my medical decision making (see chart for details).    The patient with a history of  gout and hypertension presents with acute left foot pain consistent with a gout flare. Onset was two days ago with persistent pain despite home remedies. In clinic, the patient was treated with intramuscular injections of Toradol  and Decadron for acute symptom relief. Prednisone  and Naproxen  were prescribed, with instructions to begin the following morning.  Blood pressure on arrival was significantly elevated at 204/105. The patient reports non-adherence to his antihypertensive regimen for the past three days but denies symptoms such as headache, chest pain, shortness  of breath, dizziness, nausea, vomiting, or vision changes. Clonidine 0.2 mg PO was administered in clinic with improved BP of 170/106. Given the uncontrolled hypertension in the setting of medication non-adherence, the patient was counseled on the importance of strict compliance with his blood pressure regimen and advised to follow up closely with his primary care provider for medication management. Emergency precautions were reviewed, including chest pain, neurological deficits, vision changes, or severe headache, which would warrant immediate ED evaluation.  Today's evaluation has revealed no signs of a dangerous process. Discussed diagnosis with patient and/or guardian. Patient and/or guardian aware of their diagnosis, possible red flag symptoms to watch out for and need for close follow up. Patient and/or guardian understands verbal and written discharge instructions. Patient and/or guardian comfortable with plan and disposition.  Patient and/or guardian has a clear mental status at this time, good insight into illness (after discussion and teaching) and has clear judgment to make decisions regarding their care  Documentation was completed with the aid of voice recognition software. Transcription may contain typographical errors.   Final Clinical Impressions(s) / UC Diagnoses   Final diagnoses:  Acute gout of left foot, unspecified cause  Asymptomatic hypertensive urgency     Discharge Instructions      You were seen today for pain in your left foot due to a gout flare as well as very high blood pressure. Your symptoms are consistent with an acute gout attack. In the clinic you received injections of Toradol  and Decadron to help with pain and inflammation. You were also prescribed Prednisone  and Naproxen  and should begin these tomorrow morning as directed. Continue to rest, elevate the foot when possible, and use ice packs to reduce pain and swelling. Your blood pressure today was 204/105,  which is dangerously high. You reported not taking your blood pressure medications for the past three days. In the clinic you were given Clonidine to help lower your blood pressure. It is very important that you restart your prescribed blood pressure medicine every day as directed, as untreated high blood pressure places you at risk for stroke, heart attack, kidney damage, and other serious complications. At home, take your medications exactly as prescribed. Drink plenty of fluids unless your doctor has advised otherwise. Try to avoid foods that can trigger gout attacks, such as red meats, organ meats, shellfish, and alcohol. You should schedule an appointment with your primary care provider as soon as possible to review your blood pressure and your gout management. Seek emergency care immediately if you develop chest pain, shortness of breath, sudden severe headache, vision changes, weakness, numbness, difficulty speaking or walking, or if your foot pain, redness, or swelling become severe or rapidly worsen.     ED Prescriptions     Medication Sig Dispense Auth. Provider   naproxen  (NAPROSYN ) 500 MG tablet Take 1 tablet (500 mg total) by mouth 2 (two) times daily with a meal. Take with food to avoid stomach upset. Do not take any additional NSAIDs while  on this. You may take tylenol  in addition to this if needed for extra pain relief. 20 tablet Pascual Mantel, FNP   predniSONE  (DELTASONE ) 20 MG tablet Take 2 tablets (40 mg total) by mouth daily for 5 days. 10 tablet Iola Lukes, FNP      PDMP not reviewed this encounter.   Iola Lukes, [place] 11/05/23 2045

## 2023-11-07 ENCOUNTER — Other Ambulatory Visit (HOSPITAL_COMMUNITY): Payer: Self-pay

## 2023-11-07 MED ORDER — REPATHA SURECLICK 140 MG/ML ~~LOC~~ SOAJ
140.0000 mg | SUBCUTANEOUS | 3 refills | Status: AC
  Filled 2023-11-07: qty 6, 84d supply, fill #0
  Filled 2023-11-12: qty 2, 28d supply, fill #0
  Filled 2023-12-03: qty 2, 28d supply, fill #1
  Filled 2024-01-24: qty 2, 28d supply, fill #2
  Filled 2024-02-22: qty 2, 28d supply, fill #3

## 2023-11-12 ENCOUNTER — Other Ambulatory Visit (HOSPITAL_COMMUNITY): Payer: Self-pay

## 2023-11-14 ENCOUNTER — Other Ambulatory Visit (HOSPITAL_COMMUNITY): Payer: Self-pay

## 2023-11-30 ENCOUNTER — Other Ambulatory Visit (HOSPITAL_COMMUNITY): Payer: Self-pay

## 2024-01-01 ENCOUNTER — Other Ambulatory Visit (HOSPITAL_COMMUNITY): Payer: Self-pay

## 2024-01-24 ENCOUNTER — Other Ambulatory Visit (HOSPITAL_COMMUNITY): Payer: Self-pay

## 2024-01-25 ENCOUNTER — Other Ambulatory Visit (HOSPITAL_COMMUNITY): Payer: Self-pay

## 2024-01-29 ENCOUNTER — Other Ambulatory Visit (HOSPITAL_COMMUNITY): Payer: Self-pay

## 2024-02-22 ENCOUNTER — Other Ambulatory Visit (HOSPITAL_COMMUNITY): Payer: Self-pay
